# Patient Record
Sex: Female | Born: 1965 | Race: White | Hispanic: No | Smoking: Former smoker
Health system: Southern US, Community
[De-identification: ages and names within clinical notes are randomized; demographics above are authoritative.]

## PROBLEM LIST (undated history)

## (undated) DIAGNOSIS — E119 Type 2 diabetes mellitus without complications: Secondary | ICD-10-CM

## (undated) DIAGNOSIS — I471 Supraventricular tachycardia, unspecified: Secondary | ICD-10-CM

## (undated) DIAGNOSIS — I10 Essential (primary) hypertension: Secondary | ICD-10-CM

## (undated) DIAGNOSIS — M6282 Rhabdomyolysis: Secondary | ICD-10-CM

## (undated) DIAGNOSIS — K219 Gastro-esophageal reflux disease without esophagitis: Secondary | ICD-10-CM

## (undated) DIAGNOSIS — T79A21A Traumatic compartment syndrome of right lower extremity, initial encounter: Secondary | ICD-10-CM

## (undated) DIAGNOSIS — E785 Hyperlipidemia, unspecified: Secondary | ICD-10-CM

## (undated) DIAGNOSIS — R740 Nonspecific elevation of levels of transaminase and lactic acid dehydrogenase [LDH]: Secondary | ICD-10-CM

## (undated) DIAGNOSIS — E079 Disorder of thyroid, unspecified: Secondary | ICD-10-CM

## (undated) HISTORY — DX: Traumatic compartment syndrome of right lower extremity, initial encounter: T79.A21A

## (undated) HISTORY — PX: THYROIDECTOMY: SHX17

## (undated) HISTORY — PX: CHOLECYSTECTOMY: SHX55

## (undated) HISTORY — PX: REDUCTION MAMMAPLASTY: SUR839

## (undated) HISTORY — DX: Disorder of thyroid, unspecified: E07.9

## (undated) HISTORY — DX: Rhabdomyolysis: M62.82

## (undated) HISTORY — PX: RIGHT OOPHORECTOMY: SHX2359

## (undated) HISTORY — PX: ABDOMINAL HYSTERECTOMY: SHX81

## (undated) HISTORY — DX: Hyperlipidemia, unspecified: E78.5

## (undated) HISTORY — DX: Nonspecific elevation of levels of transaminase and lactic acid dehydrogenase (ldh): R74.0

---

## 2012-10-20 ENCOUNTER — Other Ambulatory Visit: Payer: Self-pay

## 2012-10-20 DIAGNOSIS — R102 Pelvic and perineal pain: Secondary | ICD-10-CM

## 2012-10-20 DIAGNOSIS — N939 Abnormal uterine and vaginal bleeding, unspecified: Secondary | ICD-10-CM

## 2012-10-21 ENCOUNTER — Ambulatory Visit
Admission: RE | Admit: 2012-10-21 | Discharge: 2012-10-21 | Disposition: A | Discharge status: Home / Self Care | Payer: PRIVATE HEALTH INSURANCE | Source: Ambulatory Visit

## 2012-10-21 DIAGNOSIS — N939 Abnormal uterine and vaginal bleeding, unspecified: Secondary | ICD-10-CM

## 2012-10-21 DIAGNOSIS — R102 Pelvic and perineal pain: Secondary | ICD-10-CM

## 2012-12-06 HISTORY — PX: RIGHT OOPHORECTOMY: SHX2359

## 2013-04-24 DIAGNOSIS — Z8719 Personal history of other diseases of the digestive system: Secondary | ICD-10-CM | POA: Insufficient documentation

## 2013-04-24 DIAGNOSIS — R109 Unspecified abdominal pain: Secondary | ICD-10-CM | POA: Insufficient documentation

## 2013-11-26 ENCOUNTER — Emergency Department (HOSPITAL_BASED_OUTPATIENT_CLINIC_OR_DEPARTMENT_OTHER): Payer: No Typology Code available for payment source

## 2013-11-26 ENCOUNTER — Encounter (HOSPITAL_BASED_OUTPATIENT_CLINIC_OR_DEPARTMENT_OTHER): Payer: Self-pay | Admitting: Emergency Medicine

## 2013-11-26 ENCOUNTER — Emergency Department (HOSPITAL_BASED_OUTPATIENT_CLINIC_OR_DEPARTMENT_OTHER)
Admission: EM | Admit: 2013-11-26 | Discharge: 2013-11-26 | Disposition: A | Discharge status: Home / Self Care | Payer: No Typology Code available for payment source | Attending: Emergency Medicine | Admitting: Emergency Medicine

## 2013-11-26 DIAGNOSIS — W260XXA Contact with knife, initial encounter: Secondary | ICD-10-CM | POA: Insufficient documentation

## 2013-11-26 DIAGNOSIS — Y939 Activity, unspecified: Secondary | ICD-10-CM | POA: Insufficient documentation

## 2013-11-26 DIAGNOSIS — K219 Gastro-esophageal reflux disease without esophagitis: Secondary | ICD-10-CM | POA: Insufficient documentation

## 2013-11-26 DIAGNOSIS — S51809A Unspecified open wound of unspecified forearm, initial encounter: Secondary | ICD-10-CM | POA: Insufficient documentation

## 2013-11-26 DIAGNOSIS — Z791 Long term (current) use of non-steroidal anti-inflammatories (NSAID): Secondary | ICD-10-CM | POA: Insufficient documentation

## 2013-11-26 DIAGNOSIS — Y92009 Unspecified place in unspecified non-institutional (private) residence as the place of occurrence of the external cause: Secondary | ICD-10-CM | POA: Insufficient documentation

## 2013-11-26 DIAGNOSIS — W261XXA Contact with sword or dagger, initial encounter: Secondary | ICD-10-CM

## 2013-11-26 DIAGNOSIS — F17200 Nicotine dependence, unspecified, uncomplicated: Secondary | ICD-10-CM | POA: Insufficient documentation

## 2013-11-26 DIAGNOSIS — I10 Essential (primary) hypertension: Secondary | ICD-10-CM | POA: Insufficient documentation

## 2013-11-26 DIAGNOSIS — S41119A Laceration without foreign body of unspecified upper arm, initial encounter: Secondary | ICD-10-CM

## 2013-11-26 DIAGNOSIS — Z79899 Other long term (current) drug therapy: Secondary | ICD-10-CM | POA: Insufficient documentation

## 2013-11-26 HISTORY — DX: Supraventricular tachycardia, unspecified: I47.10

## 2013-11-26 HISTORY — DX: Essential (primary) hypertension: I10

## 2013-11-26 HISTORY — DX: Gastro-esophageal reflux disease without esophagitis: K21.9

## 2013-11-26 HISTORY — DX: Supraventricular tachycardia: I47.1

## 2013-11-26 NOTE — ED Notes (Signed)
MD at bedside. 

## 2013-11-26 NOTE — ED Notes (Signed)
Quick clot gauze applied to arm per MD order and wrapped with kerlix. Pt instructed to elevate arm, pt agrees with plan of care.

## 2013-11-26 NOTE — ED Notes (Signed)
Patient transported to X-ray 

## 2013-11-26 NOTE — ED Provider Notes (Addendum)
CSN: [id number]     Arrival date & time 11/26/13  0257 History   First MD Initiated Contact with Patient 11/26/13 0309     Chief Complaint  Patient presents with  . Extremity Laceration   (Consider location/radiation/quality/duration/timing/severity/associated sxs/prior Treatment) Patient is a 48 y.o. female presenting with skin laceration. The history is provided by the patient. No language interpreter was used.  Laceration Location:  Shoulder/arm Shoulder/arm laceration location:  L forearm Length (cm):  10 Depth:  Through dermis Quality: straight   Bleeding: controlled   Laceration mechanism:  Metal edge Pain details:    Quality:  Aching   Severity:  Moderate   Timing:  Constant   Progression:  Unchanged Foreign body present:  No foreign bodies Relieved by:  Nothing Worsened by:  Nothing tried Tetanus status:  Up to date   Past Medical History  Diagnosis Date  . SVT (supraventricular tachycardia)   . Hypertension   . GERD (gastroesophageal reflux disease)    Past Surgical History  Procedure Laterality Date  . Cholecystectomy    . Abdominal hysterectomy    . Thyroidectomy     History reviewed. No pertinent family history. History  Substance Use Topics  . Smoking status: Current Every Day Smoker  . Smokeless tobacco: Not on file  . Alcohol Use: Yes   OB History   Grav Para Term Preterm Abortions TAB SAB Ect Mult Living                 Review of Systems  All other systems reviewed and are negative.    Allergies  Ciprofloxacin; Dilaudid; and Prednisone  Home Medications   Current Outpatient Rx  Name  Route  Sig  Dispense  Refill  . ALPRAZolam (XANAX) 0.5 MG tablet   Oral   Take 0.5 mg by mouth at bedtime as needed for anxiety.         Marland Kitchen. amphetamine-dextroamphetamine (ADDERALL XR) 30 MG 24 hr capsule   Oral   Take 30 mg by mouth daily.         . cetirizine (ZYRTEC) 10 MG tablet   Oral   Take 10 mg by mouth daily.         Marland Kitchen. diltiazem  (CARDIZEM CD) 180 MG 24 hr capsule   Oral   Take 180 mg by mouth daily.         Marland Kitchen. esomeprazole (NEXIUM) 40 MG capsule   Oral   Take 40 mg by mouth daily at 12 noon.         Marland Kitchen. levothyroxine (SYNTHROID, LEVOTHROID) 150 MCG tablet   Oral   Take 150 mcg by mouth daily before breakfast.         . meloxicam (MOBIC) 7.5 MG tablet   Oral   Take 7.5 mg by mouth daily.         Marland Kitchen. oxycodone (OXY-IR) 5 MG capsule   Oral   Take 5 mg by mouth every 4 (four) hours as needed.         Marland Kitchen. spironolactone (ALDACTONE) 25 MG tablet   Oral   Take 25 mg by mouth daily.          BP 139/85  Pulse 116  Resp 20  Ht 5' (1.524 m)  Wt 130 lb (58.968 kg)  BMI 25.39 kg/m2  SpO2 98% Physical Exam  Constitutional: She appears well-developed and well-nourished.  intoxicated  HENT:  Head: Normocephalic and atraumatic. Head is without raccoon's eyes and without Battle's sign.  Eyes: EOM are normal. Pupils are equal, round, and reactive to light.  Neck: Normal range of motion. Neck supple.  Cardiovascular: Normal rate, regular rhythm and intact distal pulses.   Pulmonary/Chest: Effort normal and breath sounds normal. She has no wheezes. She has no rales.  Abdominal: Soft. Bowel sounds are normal. There is no tenderness. There is no rebound and no guarding.  Musculoskeletal: Normal range of motion.       Left forearm: She exhibits laceration.       Arms: Cap refill < 2 sec to the digits of the elft hand.  Wound explored no tendon no nerve no vascular injury.  FROM of the left hand and left wrist. Intact interosseous strength Thumb apposition tests and intact.   Left hand neurovascularly intact sensation intact to all nerve distributions of the left hand and forearm.  Witnessed by EMT Lequita Halt  Neurological: She is alert. She has normal reflexes.  Skin: Skin is warm and dry.  Psychiatric: Thought content normal.    ED Course  Procedures (including critical care time) Labs Review Labs Reviewed -  No data to display Imaging Review No results found.  EKG Interpretation   None       MDM  No diagnosis found. LACERATION REPAIR Performed by: Jasmine Awe Authorized by: Jasmine Awe Consent: Verbal consent obtained. Risks and benefits: risks, benefits and alternatives were discussed Consent given by: patient Patient identity confirmed: provided demographic data Prepped and Draped in normal sterile fashion Wound explored  Laceration Location: left volar forearm  Explored thoroughly no tendon nor nerve no vascular injury    Laceration Length: 10 cm  No Foreign Bodies seen or palpated  Anesthesia: local infiltration  Local anesthetic: lidocaine 1 %  With epinephrine  Anesthetic total: 7 ml  Irrigation method: syringe Amount of cleaning: extensive   Skin closure: 3.0 ethilon   Number of sutures: 14  Technique: interrupted  Patient tolerance: Patient tolerated the procedure well with no immediate complications.   Suture removal in 12 days    Swara Donze K Kijuana Ruppel-Rasch, MD 11/26/13 0457  Torian Thoennes K Marykathryn Carboni-Rasch, MD 11/26/13 [phone]

## 2013-11-26 NOTE — ED Notes (Signed)
MD at bedside for laceration repair.

## 2013-11-26 NOTE — ED Notes (Signed)
Pt has laceration to left forearm from knife at home, +PMS to extremity. Pt admits to ETOH. Tetanus UTD.

## 2013-11-26 NOTE — Discharge Instructions (Signed)

## 2013-12-10 ENCOUNTER — Ambulatory Visit (INDEPENDENT_AMBULATORY_CARE_PROVIDER_SITE_OTHER): Payer: No Typology Code available for payment source | Admitting: Internal Medicine

## 2013-12-10 ENCOUNTER — Encounter: Payer: Self-pay | Admitting: Internal Medicine

## 2013-12-10 ENCOUNTER — Ambulatory Visit: Payer: No Typology Code available for payment source | Admitting: Internal Medicine

## 2013-12-10 VITALS — BP 120/80 | HR 80 | Temp 98.5°F | Resp 16 | Ht 59.0 in | Wt 130.0 lb

## 2013-12-10 DIAGNOSIS — Z5189 Encounter for other specified aftercare: Secondary | ICD-10-CM

## 2013-12-10 NOTE — Progress Notes (Signed)
Pre visit review using our clinic review tool, if applicable. No additional management support is needed unless otherwise documented below in the visit note. 

## 2013-12-11 ENCOUNTER — Encounter: Payer: Self-pay | Admitting: Internal Medicine

## 2013-12-11 ENCOUNTER — Ambulatory Visit: Payer: No Typology Code available for payment source | Admitting: Internal Medicine

## 2013-12-11 DIAGNOSIS — Z5189 Encounter for other specified aftercare: Secondary | ICD-10-CM | POA: Insufficient documentation

## 2013-12-11 NOTE — Assessment & Plan Note (Signed)
The wound has healed well with no complications

## 2013-12-11 NOTE — Patient Instructions (Signed)
Wound Care Wound care helps prevent pain and infection.  You may need a tetanus shot if:  You cannot remember when you had your last tetanus shot.  You have never had a tetanus shot.  The injury broke your skin. If you need a tetanus shot and you choose not to have one, you may get tetanus. Sickness from tetanus can be serious. HOME CARE   Only take medicine as told by your doctor.  Clean the wound daily with mild soap and water.  Change any bandages (dressings) as told by your doctor.  Put medicated cream and a bandage on the wound as told by your doctor.  Change the bandage if it gets wet, dirty, or starts to smell.  Take showers. Do not take baths, swim, or do anything that puts your wound under water.  Rest and raise (elevate) the wound until the pain and puffiness (swelling) are better.  Keep all doctor visits as told. GET HELP RIGHT AWAY IF:   Yellowish-white fluid (pus) comes from the wound.  Medicine does not lessen your pain.  There is a red streak going away from the wound.  You have a fever. MAKE SURE YOU:   Understand these instructions.  Will watch your condition.  Will get help right away if you are not doing well or get worse. Document Released: 07/31/2008 Document Revised: 01/14/2012 Document Reviewed: 02/25/2011 [employer] Patient Information 2014 [employer].  

## 2013-12-11 NOTE — Progress Notes (Signed)
   Subjective:    Patient ID: Jessica Knox, female    DOB: [birth year], 48 y.o.   MRN: [id number]  Wound Check She was originally treated more than 14 days ago. Previous treatment included laceration repair. The maximum temperature noted was less than 100.4 F. There has been no drainage from the wound. There is no redness present. There is no swelling present. The pain has no pain. She has no difficulty moving the affected extremity or digit.      Review of Systems  All other systems reviewed and are negative.       Objective:   Physical Exam  Musculoskeletal:       Arms: 14 sutures were removed without complication. Steri-strips were placed over the wound at her request for support.          Assessment & Plan:

## 2014-09-28 ENCOUNTER — Encounter: Payer: Self-pay | Admitting: Radiology

## 2014-09-28 NOTE — Addendum Note (Signed)
Addended byCaffie Damme: LITTRELL, AMY W on: 09/28/2014 11:41 AM   Modules accepted: Orders, Medications

## 2014-10-27 ENCOUNTER — Ambulatory Visit: Payer: Self-pay | Admitting: Family Medicine

## 2014-11-02 ENCOUNTER — Encounter: Payer: Self-pay | Admitting: Family Medicine

## 2014-11-02 ENCOUNTER — Ambulatory Visit (INDEPENDENT_AMBULATORY_CARE_PROVIDER_SITE_OTHER): Payer: BC Managed Care – PPO | Admitting: Family Medicine

## 2014-11-02 VITALS — BP 128/83 | HR 100 | Temp 98.2°F | Resp 16 | Ht 59.25 in | Wt 133.4 lb

## 2014-11-02 DIAGNOSIS — F988 Other specified behavioral and emotional disorders with onset usually occurring in childhood and adolescence: Secondary | ICD-10-CM

## 2014-11-02 DIAGNOSIS — I10 Essential (primary) hypertension: Secondary | ICD-10-CM

## 2014-11-02 DIAGNOSIS — E039 Hypothyroidism, unspecified: Secondary | ICD-10-CM

## 2014-11-02 DIAGNOSIS — F909 Attention-deficit hyperactivity disorder, unspecified type: Secondary | ICD-10-CM

## 2014-11-02 DIAGNOSIS — N390 Urinary tract infection, site not specified: Secondary | ICD-10-CM

## 2014-11-02 LAB — LIPID PANEL
CHOL/HDL RATIO: 3.9 ratio
Cholesterol: 183 mg/dL (ref 0–200)
HDL: 47 mg/dL (ref >39)
LDL Cholesterol: 88 mg/dL (ref 0–99)
TRIGLYCERIDES: 242 mg/dL — AB (ref <150)
VLDL: 48 mg/dL — ABNORMAL HIGH (ref 0–40)

## 2014-11-02 LAB — COMPREHENSIVE METABOLIC PANEL
ALBUMIN: 4.1 g/dL (ref 3.5–5.2)
ALK PHOS: 83 U/L (ref 39–117)
ALT: 23 U/L (ref 0–35)
AST: 16 U/L (ref 0–37)
BUN: 11 mg/dL (ref 6–23)
CO2: 24 mEq/L (ref 19–32)
Calcium: 8.7 mg/dL (ref 8.4–10.5)
Chloride: 101 mEq/L (ref 96–112)
Creat: 0.7 mg/dL (ref 0.50–1.10)
GLUCOSE: 153 mg/dL — AB (ref 70–99)
Potassium: 4.3 mEq/L (ref 3.5–5.3)
Sodium: 135 mEq/L (ref 135–145)
Total Bilirubin: 0.3 mg/dL (ref 0.2–1.2)
Total Protein: 6.3 g/dL (ref 6.0–8.3)

## 2014-11-02 LAB — POCT URINALYSIS DIPSTICK
Bilirubin, UA: NEGATIVE
Blood, UA: NEGATIVE
Glucose, UA: NEGATIVE
Ketones, UA: NEGATIVE
PROTEIN UA: NEGATIVE
Spec Grav, UA: 1.015
UROBILINOGEN UA: 0.2
pH, UA: 7

## 2014-11-02 LAB — CBC
HCT: 45 % (ref 36.0–46.0)
Hemoglobin: 15.5 g/dL — ABNORMAL HIGH (ref 12.0–15.0)
MCH: 32.3 pg (ref 26.0–34.0)
MCHC: 34.4 g/dL (ref 30.0–36.0)
MCV: 93.8 fL (ref 78.0–100.0)
MPV: 9.6 fL (ref 8.6–12.4)
PLATELETS: 328 10*3/uL (ref 150–400)
RBC: 4.8 MIL/uL (ref 3.87–5.11)
RDW: 12.4 % (ref 11.5–15.5)
WBC: 9.4 10*3/uL (ref 4.0–10.5)

## 2014-11-02 MED ORDER — AMPHETAMINE-DEXTROAMPHET ER 20 MG PO CP24: 20.0000 mg | ORAL_CAPSULE | Freq: Two times a day (BID) | ORAL | Status: DC

## 2014-11-02 NOTE — Progress Notes (Signed)
Subjective:    Patient ID: Jessica Knox, female    DOB: 23-Oct-1966, 48 y.o.   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 She has a history of hypothyroidism, last TSH 08/09/14. Has had fluctuating TSH levels and is currently taking synthroid 150 mcg.  Long standing history of right sided lower abdominal pain since 6/14. Sees gyn and may have DaVinci procedure for exploration. Was seen about a month ago. Pain worse with stress and intercourse. Feels like contractions and then "butcher knife." occasionally takes oxycodone immediate release. Has follow up scheduled with gyn.  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 Has history of reentry SVT. Has history of frequent UTI. Has never been on prophylactic or post-coital antibiotics. Gets about 6 a year. Keeps mild symptoms.  Has had recent stressors with older son having a mental breakdown and fiance with cardiac issues.   Past Medical History  Diagnosis Date  . SVT (supraventricular tachycardia)   . Hypertension   . GERD (gastroesophageal reflux disease)   . Thyroid disease     multinodular thyroid total thyroidectomy   Past Surgical History  Procedure Laterality Date  . Cholecystectomy    . Abdominal hysterectomy    . Thyroidectomy    . Right oophorectomy  12/06/2012  multiple D&C, Csection x 2, breast reduction. Family History  Problem Relation Age of Onset  . Adopted: Yes  . Family history unknown: Yes   History  Substance Use Topics  . Smoking status: Current Every Day Smoker  . Smokeless tobacco: Not on file  . Alcohol Use: Yes   Review of Systems  Constitutional: Negative for fever and chills.  Respiratory: Negative for cough, chest tightness, shortness of breath and wheezing.   Cardiovascular: Negative for chest pain, palpitations and leg swelling.  Gastrointestinal: Positive for abdominal pain. Negative for nausea,  vomiting, diarrhea and constipation.  Genitourinary: Negative for vaginal discharge.  Musculoskeletal: Negative.   Allergic/Immunologic: Negative.   Neurological: Negative.   Hematological: Negative.       Objective:   Physical Exam  Constitutional: She is oriented to person, place, and time. She appears well-developed and well-nourished. No distress.  HENT:  Head: Normocephalic and atraumatic.  Right Ear: Tympanic membrane, external ear and ear canal normal.  Left Ear: Tympanic membrane, external ear and ear canal normal.  Nose: Nose normal.  Mouth/Throat: Oropharynx is clear and moist.  Eyes: Conjunctivae are normal. Pupils are equal, round, and reactive to light.  Neck: Normal range of motion. Neck supple. No thyromegaly present.  Cardiovascular: Normal rate and regular rhythm.   Murmur heard.  Systolic murmur is present with a grade of 2/6  Pulmonary/Chest: Effort normal and breath sounds normal.  Abdominal: Soft. Bowel sounds are normal. She exhibits no distension and no mass. There is no tenderness. There is no rebound and no guarding.  Musculoskeletal: Normal range of motion.  Lymphadenopathy:    She has no cervical adenopathy.  Neurological: She is alert and oriented to person, place, and time.  Skin: Skin is warm and dry. She is not diaphoretic.  Psychiatric: She has a normal mood and affect. Her behavior is normal. Judgment and thought content normal.  Vitals reviewed.  BP 128/83 mmHg  Pulse 100  Temp(Src) 98.2 F (36.8 C) (Oral)  Resp 16  Ht 4' 11.25" (1.505 m)  Wt 133 lb 6.4 oz (60.51 kg)  BMI  26.71 kg/m2  SpO2 98%    Assessment & Plan:  1. ADD (attention deficit disorder) - amphetamine-dextroamphetamine (ADDERALL XR) 20 MG 24 hr capsule; Take 1 capsule (20 mg total) by mouth 2 (two) times daily.  Dispense: 60 capsule; Refill: 0  2. Essential hypertension - CBC - Comprehensive metabolic panel - Lipid panel  3. Hypothyroidism, unspecified hypothyroidism  type - CBC - Comprehensive metabolic panel - TSH - Lipid panel  4. Frequent UTI - POCT urinalysis dipstick   Emi Belfasteborah B. Gessner, FNP-BC  [facility], [facility]  11/04/2014 7:14 AM

## 2014-11-03 ENCOUNTER — Other Ambulatory Visit: Payer: Self-pay | Admitting: Family Medicine

## 2014-11-03 ENCOUNTER — Other Ambulatory Visit (INDEPENDENT_AMBULATORY_CARE_PROVIDER_SITE_OTHER): Payer: BC Managed Care – PPO

## 2014-11-03 DIAGNOSIS — E890 Postprocedural hypothyroidism: Secondary | ICD-10-CM

## 2014-11-03 DIAGNOSIS — R8290 Unspecified abnormal findings in urine: Secondary | ICD-10-CM

## 2014-11-03 LAB — TSH: TSH: 0.069 u[IU]/mL — ABNORMAL LOW (ref 0.350–4.500)

## 2014-11-03 MED ORDER — LEVOTHYROXINE SODIUM 125 MCG PO TABS: 125.0000 ug | ORAL_TABLET | Freq: Every day | ORAL | Status: DC

## 2014-11-05 ENCOUNTER — Other Ambulatory Visit: Payer: Self-pay | Admitting: Family Medicine

## 2014-11-05 LAB — URINE CULTURE: Colony Count: >=100000

## 2014-11-05 MED ORDER — NITROFURANTOIN MONOHYD MACRO 100 MG PO CAPS: 100.0000 mg | ORAL_CAPSULE | Freq: Two times a day (BID) | ORAL | Status: DC

## 2014-11-16 ENCOUNTER — Other Ambulatory Visit: Payer: Self-pay | Admitting: Family Medicine

## 2014-11-16 ENCOUNTER — Other Ambulatory Visit (INDEPENDENT_AMBULATORY_CARE_PROVIDER_SITE_OTHER): Payer: BLUE CROSS/BLUE SHIELD | Admitting: Family Medicine

## 2014-11-16 ENCOUNTER — Telehealth: Payer: Self-pay | Admitting: Family Medicine

## 2014-11-16 DIAGNOSIS — N390 Urinary tract infection, site not specified: Secondary | ICD-10-CM

## 2014-11-16 DIAGNOSIS — N3090 Cystitis, unspecified without hematuria: Secondary | ICD-10-CM

## 2014-11-16 NOTE — Telephone Encounter (Signed)
Patient reports urinary symptoms not resolved after finishing macrobid. Will reculture urine. Order placed.

## 2014-11-17 ENCOUNTER — Telehealth: Payer: Self-pay | Admitting: Family Medicine

## 2014-11-17 ENCOUNTER — Other Ambulatory Visit: Payer: Self-pay | Admitting: Family Medicine

## 2014-11-17 MED ORDER — SERTRALINE HCL 100 MG PO TABS: ORAL_TABLET | ORAL | Status: DC

## 2014-11-17 NOTE — Telephone Encounter (Signed)
Patient interested in starting SSRI for depressive symptoms. Her therapist, Dr. Danne BaxterAaron Stuart has suggested sertraline. Discussed with patient . Order for sertraline sent to her pharmacy.

## 2014-11-18 ENCOUNTER — Encounter: Payer: Self-pay | Admitting: Family Medicine

## 2014-11-18 LAB — URINE CULTURE: Colony Count: >=100000

## 2014-11-20 ENCOUNTER — Encounter: Payer: Self-pay | Admitting: Family Medicine

## 2014-11-20 ENCOUNTER — Other Ambulatory Visit: Payer: Self-pay | Admitting: Family Medicine

## 2014-11-20 DIAGNOSIS — B9620 Unspecified Escherichia coli [E. coli] as the cause of diseases classified elsewhere: Secondary | ICD-10-CM

## 2014-11-20 DIAGNOSIS — N390 Urinary tract infection, site not specified: Secondary | ICD-10-CM

## 2014-11-20 MED ORDER — LEVOFLOXACIN 250 MG PO TABS: 250.0000 mg | ORAL_TABLET | Freq: Every day | ORAL | Status: DC

## 2014-11-30 ENCOUNTER — Other Ambulatory Visit: Payer: Self-pay | Admitting: Family Medicine

## 2014-11-30 DIAGNOSIS — K219 Gastro-esophageal reflux disease without esophagitis: Secondary | ICD-10-CM

## 2014-11-30 MED ORDER — ESOMEPRAZOLE MAGNESIUM 40 MG PO CPDR: 40.0000 mg | DELAYED_RELEASE_CAPSULE | Freq: Every day | ORAL | Status: DC

## 2014-12-04 ENCOUNTER — Other Ambulatory Visit: Payer: Self-pay | Admitting: Family Medicine

## 2014-12-04 ENCOUNTER — Other Ambulatory Visit: Payer: Self-pay

## 2014-12-04 MED ORDER — SPIRONOLACTONE 25 MG PO TABS: 25.0000 mg | ORAL_TABLET | Freq: Every day | ORAL | Status: DC

## 2014-12-04 MED ORDER — DILTIAZEM HCL ER COATED BEADS 180 MG PO CP24: 180.0000 mg | ORAL_CAPSULE | Freq: Every day | ORAL | Status: DC

## 2014-12-17 DIAGNOSIS — R102 Pelvic and perineal pain: Secondary | ICD-10-CM | POA: Insufficient documentation

## 2014-12-21 ENCOUNTER — Other Ambulatory Visit: Payer: Self-pay | Admitting: Family Medicine

## 2014-12-21 DIAGNOSIS — Z01818 Encounter for other preprocedural examination: Secondary | ICD-10-CM

## 2014-12-28 ENCOUNTER — Other Ambulatory Visit (INDEPENDENT_AMBULATORY_CARE_PROVIDER_SITE_OTHER): Payer: BLUE CROSS/BLUE SHIELD | Admitting: Family Medicine

## 2014-12-28 DIAGNOSIS — B9620 Unspecified Escherichia coli [E. coli] as the cause of diseases classified elsewhere: Secondary | ICD-10-CM

## 2014-12-28 DIAGNOSIS — N390 Urinary tract infection, site not specified: Secondary | ICD-10-CM

## 2014-12-28 DIAGNOSIS — E890 Postprocedural hypothyroidism: Secondary | ICD-10-CM

## 2014-12-28 DIAGNOSIS — Z01818 Encounter for other preprocedural examination: Secondary | ICD-10-CM

## 2014-12-28 LAB — POCT URINALYSIS DIPSTICK
Bilirubin, UA: NEGATIVE
Glucose, UA: NEGATIVE
KETONES UA: NEGATIVE
Nitrite, UA: POSITIVE
PROTEIN UA: NEGATIVE
Spec Grav, UA: >=1.03
Urobilinogen, UA: 0.2
pH, UA: 5.5

## 2014-12-28 LAB — CBC WITH DIFFERENTIAL/PLATELET
BASOS ABS: 0 10*3/uL (ref 0.0–0.1)
BASOS PCT: 0 % (ref 0–1)
EOS ABS: 0.2 10*3/uL (ref 0.0–0.7)
EOS PCT: 2 % (ref 0–5)
HEMATOCRIT: 46 % (ref 36.0–46.0)
Hemoglobin: 15.8 g/dL — ABNORMAL HIGH (ref 12.0–15.0)
Lymphocytes Relative: 23 % (ref 12–46)
Lymphs Abs: 2.5 10*3/uL (ref 0.7–4.0)
MCH: 32 pg (ref 26.0–34.0)
MCHC: 34.3 g/dL (ref 30.0–36.0)
MCV: 93.1 fL (ref 78.0–100.0)
MONO ABS: 1.1 10*3/uL — AB (ref 0.1–1.0)
MONOS PCT: 10 % (ref 3–12)
MPV: 9.2 fL (ref 8.6–12.4)
Neutro Abs: 7.2 10*3/uL (ref 1.7–7.7)
Neutrophils Relative %: 65 % (ref 43–77)
Platelets: 272 10*3/uL (ref 150–400)
RBC: 4.94 MIL/uL (ref 3.87–5.11)
RDW: 13 % (ref 11.5–15.5)
WBC: 11 10*3/uL — AB (ref 4.0–10.5)

## 2014-12-28 LAB — BASIC METABOLIC PANEL
BUN: 12 mg/dL (ref 6–23)
CALCIUM: 8.9 mg/dL (ref 8.4–10.5)
CO2: 25 mEq/L (ref 19–32)
CREATININE: 0.67 mg/dL (ref 0.50–1.10)
Chloride: 102 mEq/L (ref 96–112)
GLUCOSE: 106 mg/dL — AB (ref 70–99)
POTASSIUM: 4.1 meq/L (ref 3.5–5.3)
Sodium: 136 mEq/L (ref 135–145)

## 2014-12-28 LAB — POCT UA - MICROSCOPIC ONLY
CASTS, UR, LPF, POC: NEGATIVE
CRYSTALS, UR, HPF, POC: NEGATIVE
Mucus, UA: POSITIVE
Yeast, UA: NEGATIVE

## 2014-12-28 LAB — TSH: TSH: 0.934 u[IU]/mL (ref 0.350–4.500)

## 2014-12-31 ENCOUNTER — Encounter: Payer: Self-pay | Admitting: Family Medicine

## 2014-12-31 LAB — URINE CULTURE

## 2015-01-03 ENCOUNTER — Other Ambulatory Visit: Payer: Self-pay | Admitting: Family Medicine

## 2015-01-03 MED ORDER — FLUCONAZOLE 150 MG PO TABS: 150.0000 mg | ORAL_TABLET | Freq: Once | ORAL | Status: DC

## 2015-01-10 HISTORY — PX: ROBOTIC ASSISTED DIAGNOSTIC LAPAROSCOPY: SHX6532

## 2015-01-11 ENCOUNTER — Ambulatory Visit (INDEPENDENT_AMBULATORY_CARE_PROVIDER_SITE_OTHER): Payer: BLUE CROSS/BLUE SHIELD | Admitting: Family Medicine

## 2015-01-11 ENCOUNTER — Other Ambulatory Visit: Payer: Self-pay | Admitting: Family Medicine

## 2015-01-11 VITALS — BP 130/78 | HR 115 | Temp 97.8°F | Resp 20 | Ht 59.25 in | Wt 149.2 lb

## 2015-01-11 DIAGNOSIS — G8918 Other acute postprocedural pain: Secondary | ICD-10-CM

## 2015-01-11 DIAGNOSIS — R110 Nausea: Secondary | ICD-10-CM

## 2015-01-11 DIAGNOSIS — R1030 Lower abdominal pain, unspecified: Secondary | ICD-10-CM

## 2015-01-11 MED ORDER — KETOROLAC TROMETHAMINE 60 MG/2ML IM SOLN
60.0000 mg | Freq: Once | INTRAMUSCULAR | Status: AC
  Administered 2015-01-11: 60 mg via INTRAMUSCULAR

## 2015-01-11 MED ORDER — OXYCODONE HCL 5 MG PO CAPS: 5.0000 mg | ORAL_CAPSULE | ORAL | Status: DC | PRN

## 2015-01-11 MED ORDER — ONDANSETRON 8 MG PO TBDP: 8.0000 mg | ORAL_TABLET | Freq: Three times a day (TID) | ORAL | Status: DC | PRN

## 2015-01-11 NOTE — Progress Notes (Signed)
   Subjective:    Patient ID: Jessica Knox, female    DOB: 1966/02/09, 49 y.o.   MRN: [id number]  HPI Patient presents today following exploratory lap yesterday. This was done out of town. She was discharged yesterday evening and post operatively was not given her prescriptions for pain or nausea. Her fiance brings her in this evening after they have been driving for several hours. She is currently having 10+/10 abdominal pain that is generalized. She is not having any fever, nausea, vomiting, no chest pain, she has had some SOB with exertion. Urinating without difficulty. Passing gas and moving bowels. Last BM today.   Review of Systems No fever/chills, no chest pain. No nausea, no vomiting. Feels winded with exertion. Good fluid intake.     Objective:   Physical Exam  Constitutional: She is oriented to person, place, and time. She appears well-developed and well-nourished.  Looks uncomfortable.   HENT:  Head: Normocephalic and atraumatic.  Eyes: Conjunctivae are normal.  Neck: Normal range of motion.  Cardiovascular: Regular rhythm and normal heart sounds.  Tachycardia present.   Pulmonary/Chest: Effort normal and breath sounds normal.  Abdominal: Bowel sounds are normal. She exhibits distension. She exhibits no mass. There is generalized tenderness. There is no rigidity, no rebound and no guarding.    Three surgical sites. No erythema, no drainage, no warmth.   Musculoskeletal: Normal range of motion.  Neurological: She is alert and oriented to person, place, and time.  Skin: Skin is warm and dry.  Psychiatric: She has a normal mood and affect. Her behavior is normal. Judgment and thought content normal.  Vitals reviewed.  BP 130/78 mmHg  Pulse 115  Temp(Src) 97.8 F (36.6 C) (Oral)  Resp 20  Ht 4' 11.25" (1.505 m)  Wt 149 lb 4 oz (67.699 kg)  BMI 29.89 kg/m2  SpO2 96%  Meds ordered this encounter  Medications  . ketorolac (TORADOL) injection 60 mg    Sig:    Patient  with improved pain and decreased HR and resp. Rate following toradol. Looks less uncomfortable.     Assessment & Plan:  1. Post-op pain - ketorolac (TORADOL) injection 60 mg; Inject 2 mLs (60 mg total) into the muscle once. - encouraged regular pain medication use, avoidance of constipation - follow up if worsening symptoms, fever, vomiting. Emi Belfasteborah B. Markasia Carrol, FNP-BC  [facility]  01/13/2015 10:22 PM

## 2015-01-11 NOTE — Patient Instructions (Signed)
Increase fluids Try to add an NSAID every 8 hours

## 2015-01-11 NOTE — Progress Notes (Unsigned)
[place] yesterday [place], [place] to [place] and realized she did not have her pain med or anti nausea prescriptions. Will give her enough for the next couple of days.

## 2015-01-14 NOTE — Progress Notes (Signed)
It would be good to have her go ahead and notify her surgeon of all that was done here so they are also aware. She could call herself.

## 2015-01-24 ENCOUNTER — Other Ambulatory Visit: Payer: Self-pay | Admitting: Family Medicine

## 2015-01-24 MED ORDER — SERTRALINE HCL 100 MG PO TABS: 150.0000 mg | ORAL_TABLET | Freq: Every day | ORAL | Status: DC

## 2015-01-24 NOTE — Progress Notes (Signed)
Patient post op from exploratory lap and oopherectomy 2 weeks prior. Has had increased emotional lability and crying spells. Denies suicidal ideation. Was instructed to increase sertraline dose to 150 mg. New prescription sent in.

## 2015-01-25 ENCOUNTER — Other Ambulatory Visit: Payer: Self-pay | Admitting: Family Medicine

## 2015-01-25 ENCOUNTER — Telehealth: Payer: Self-pay | Admitting: Family Medicine

## 2015-01-25 DIAGNOSIS — R1030 Lower abdominal pain, unspecified: Secondary | ICD-10-CM

## 2015-01-25 MED ORDER — OXYCODONE HCL 5 MG PO CAPS: 5.0000 mg | ORAL_CAPSULE | ORAL | Status: DC | PRN

## 2015-01-25 MED ORDER — MELOXICAM 15 MG PO TABS: 15.0000 mg | ORAL_TABLET | Freq: Every day | ORAL | Status: DC

## 2015-01-25 NOTE — Telephone Encounter (Signed)
Spoke with patient in person at work today. She continues to have pain following exploratory lap 2 weeks ago. Requesting refill of Oxy- IR 5 mg. She reports she is taking 1-2 tablets every 4-8 hours for pain. Is having generalized abdominal pain and feels "pressing from front to back." Denies constipation. Consuming liquids and solids without difficulty. Denies fever. Has felt bloated since surgery. Discussed filling pain medication with patient and need to refer to pain clinic for management of chronic pain medication. Patient states that she is not planning on being on pain medication long term. Discussed the fact that she has been on pain meds for 2 years and asked her to think about need for referral which she said she would do. Encouraged patient to be seen at 102 if no improvement in symptoms or if fever or increased pain. Patient reports that she has a call in to gyn who did [place] in [place]. Encouraged her to add NSAID to pain treatment regimen and prescribed meloxicam 15 mg po qd for temporary use.

## 2015-01-26 ENCOUNTER — Encounter: Payer: Self-pay | Admitting: Physician Assistant

## 2015-01-26 ENCOUNTER — Ambulatory Visit (HOSPITAL_COMMUNITY)
Admission: RE | Admit: 2015-01-26 | Discharge: 2015-01-26 | Disposition: A | Discharge status: Home / Self Care | Payer: BLUE CROSS/BLUE SHIELD | Source: Ambulatory Visit | Attending: Physician Assistant | Admitting: Physician Assistant

## 2015-01-26 ENCOUNTER — Ambulatory Visit (INDEPENDENT_AMBULATORY_CARE_PROVIDER_SITE_OTHER): Payer: BLUE CROSS/BLUE SHIELD

## 2015-01-26 ENCOUNTER — Telehealth: Payer: Self-pay | Admitting: Physician Assistant

## 2015-01-26 ENCOUNTER — Ambulatory Visit (INDEPENDENT_AMBULATORY_CARE_PROVIDER_SITE_OTHER): Payer: BLUE CROSS/BLUE SHIELD | Admitting: Physician Assistant

## 2015-01-26 VITALS — BP 134/82 | HR 118 | Temp 98.0°F | Resp 17 | Ht 60.5 in | Wt 148.0 lb

## 2015-01-26 DIAGNOSIS — K760 Fatty (change of) liver, not elsewhere classified: Secondary | ICD-10-CM | POA: Insufficient documentation

## 2015-01-26 DIAGNOSIS — Z6832 Body mass index (BMI) 32.0-32.9, adult: Secondary | ICD-10-CM | POA: Insufficient documentation

## 2015-01-26 DIAGNOSIS — R8271 Bacteriuria: Secondary | ICD-10-CM

## 2015-01-26 DIAGNOSIS — R1084 Generalized abdominal pain: Secondary | ICD-10-CM

## 2015-01-26 DIAGNOSIS — K529 Noninfective gastroenteritis and colitis, unspecified: Secondary | ICD-10-CM | POA: Diagnosis not present

## 2015-01-26 DIAGNOSIS — N390 Urinary tract infection, site not specified: Secondary | ICD-10-CM

## 2015-01-26 DIAGNOSIS — N393 Stress incontinence (female) (male): Secondary | ICD-10-CM | POA: Insufficient documentation

## 2015-01-26 DIAGNOSIS — Z87891 Personal history of nicotine dependence: Secondary | ICD-10-CM | POA: Insufficient documentation

## 2015-01-26 DIAGNOSIS — R1031 Right lower quadrant pain: Secondary | ICD-10-CM | POA: Insufficient documentation

## 2015-01-26 LAB — POCT URINALYSIS DIPSTICK
Bilirubin, UA: NEGATIVE
Blood, UA: NEGATIVE
Glucose, UA: NEGATIVE
Ketones, UA: NEGATIVE
Nitrite, UA: POSITIVE
Protein, UA: NEGATIVE
Spec Grav, UA: 1.015
Urobilinogen, UA: 0.2
pH, UA: 7

## 2015-01-26 LAB — POCT CBC
Granulocyte percent: 72.2 %G (ref 37–80)
HCT, POC: 49.7 % — AB (ref 37.7–47.9)
Hemoglobin: 16.4 g/dL — AB (ref 12.2–16.2)
Lymph, poc: 2.3 (ref 0.6–3.4)
MCH, POC: 31.4 pg — AB (ref 27–31.2)
MCHC: 32.9 g/dL (ref 31.8–35.4)
MCV: 95.6 fL (ref 80–97)
MID (cbc): 0.5 (ref 0–0.9)
MPV: 6.5 fL (ref 0–99.8)
POC Granulocyte: 7.4 — AB (ref 2–6.9)
POC LYMPH PERCENT: 22.6 %L (ref 10–50)
POC MID %: 5.2 %M (ref 0–12)
Platelet Count, POC: 342 10*3/uL (ref 142–424)
RBC: 5.2 M/uL (ref 4.04–5.48)
RDW, POC: 12.8 %
WBC: 10.2 10*3/uL (ref 4.6–10.2)

## 2015-01-26 LAB — POCT UA - MICROSCOPIC ONLY
Casts, Ur, LPF, POC: NEGATIVE
Crystals, Ur, HPF, POC: NEGATIVE
Mucus, UA: NEGATIVE
RBC, urine, microscopic: NEGATIVE
Yeast, UA: NEGATIVE

## 2015-01-26 LAB — COMPREHENSIVE METABOLIC PANEL
ALT: 54 U/L — ABNORMAL HIGH (ref 0–35)
AST: 41 U/L — ABNORMAL HIGH (ref 0–37)
Albumin: 4.7 g/dL (ref 3.5–5.2)
Alkaline Phosphatase: 91 U/L (ref 39–117)
BUN: 13 mg/dL (ref 6–23)
CO2: 23 mEq/L (ref 19–32)
Calcium: 9.2 mg/dL (ref 8.4–10.5)
Chloride: 101 mEq/L (ref 96–112)
Creat: 0.67 mg/dL (ref 0.50–1.10)
Glucose, Bld: 123 mg/dL — ABNORMAL HIGH (ref 70–99)
Potassium: 4.4 mEq/L (ref 3.5–5.3)
Sodium: 137 mEq/L (ref 135–145)
Total Bilirubin: 0.5 mg/dL (ref 0.2–1.2)
Total Protein: 6.8 g/dL (ref 6.0–8.3)

## 2015-01-26 MED ORDER — CIPROFLOXACIN HCL 500 MG PO TABS: 500.0000 mg | ORAL_TABLET | Freq: Two times a day (BID) | ORAL | Status: AC

## 2015-01-26 MED ORDER — IOHEXOL 300 MG/ML  SOLN
100.0000 mL | Freq: Once | INTRAMUSCULAR | Status: AC | PRN
  Administered 2015-01-26: 100 mL via INTRAVENOUS

## 2015-01-26 MED ORDER — METRONIDAZOLE 500 MG PO TABS: 500.0000 mg | ORAL_TABLET | Freq: Two times a day (BID) | ORAL | Status: AC

## 2015-01-26 NOTE — Progress Notes (Signed)
Patient ID: Jessica Knox, female    DOB: September 25, 1966, 49 y.o.   MRN: [id number]  PCP: Emi Belfast, FNP  Subjective:   Chief Complaint  Patient presents with  . Abdominal Pain    post op   . Chills    HPI Presents for evaluation of abdominal pain 2 weeks following surgery for lysis of adhesions and LEFT oophorectomy. Has had ovarian cysts since adolescence.  Hysterectomy at age 75 years. 2 years ago had surgery to remove  RIGHT ovary and tube after acute rupture resulted in scar tissue adhering to the large intestine. Large cyst was noted on the LEFT, but the surgeon did not want to remove it and leave her without either ovary. She developed scar tissue/adhesions quickly, and has been having daily pain x 18 months.  On 01/10/2015 she had robotic daVinci procedure to remove the LEFT ovary and lyse the adhesions, including adhesions on the appendix, which was not removed.   She was initially feeling better, and came back to work 3 days ago. She was fatigued, and only working 4 hour shifts.  Yesterday afternoon, while seated at her desk, she developed severe (8/10) RLQ abdominal pain. Stabbing pain. Pain spread throughout her abdomen, but remains worse in the RLQ. Presently rates it a 6. No nausea, vomiting. Reports fever, that her normal temperature is 97.5, and that her axillary temperature last night was 98.6. Passing stool/gas.   Has had an E. Coli UTI (reportedly since December 2015). "I have a nurses' bladder." No increased frequency, urgency or burning. No unusual odor. No atypical vaginal discharge.  Has stress incontinence at baseline.   Review of Systems Review of Systems As above.    Patient Active Problem List   Diagnosis Date Noted  . Smoker 01/26/2015  . BMI 28.0-28.9,adult 01/26/2015  . Adnexal pain 12/17/2014  . Abdominal pain 04/24/2013  . Pancreatitis 04/24/2013     Prior to Admission medications   Medication Sig Start Date End Date Taking? Authorizing  Provider  ALPRAZolam Prudy Feeler) 0.5 MG tablet Take 0.5 mg by mouth at bedtime as needed for anxiety.   Yes Historical Provider, MD  amphetamine-dextroamphetamine (ADDERALL XR) 20 MG 24 hr capsule Take 1 capsule (20 mg total) by mouth 2 (two) times daily. 11/02/14  Yes Emi Belfast, FNP  cetirizine (ZYRTEC) 10 MG tablet Take 10 mg by mouth daily.   Yes Historical Provider, MD  diltiazem (CARDIZEM CD) 180 MG 24 hr capsule Take 1 capsule (180 mg total) by mouth daily. 12/04/14  Yes Emi Belfast, FNP  esomeprazole (NEXIUM) 40 MG capsule Take 1 capsule (40 mg total) by mouth daily at 12 noon. 11/30/14  Yes Emi Belfast, FNP  Lactobacillus (ULTIMATE PROBIOTIC FORMULA) CAPS Take 25 each by mouth. 25 billion dose/ 10 probiotics ultimate flora   Yes Historical Provider, MD  levothyroxine (SYNTHROID, LEVOTHROID) 125 MCG tablet Take 1 tablet (125 mcg total) by mouth daily. 11/03/14  Yes Emi Belfast, FNP  meloxicam (MOBIC) 15 MG tablet Take 1 tablet (15 mg total) by mouth daily. 01/25/15  Yes Emi Belfast, FNP  oxycodone (OXY-IR) 5 MG capsule Take 1 capsule (5 mg total) by mouth every 4 (four) hours as needed. 01/25/15  Yes Emi Belfast, FNP  sertraline (ZOLOFT) 100 MG tablet Take 1.5 tablets (150 mg total) by mouth daily. 01/24/15  Yes Emi Belfast, FNP  spironolactone (ALDACTONE) 25 MG tablet Take 1 tablet (25 mg total) by mouth daily. 12/04/14  Yes  Emi Belfast, FNP  fluconazole (DIFLUCAN) 150 MG tablet Take 1 tablet (150 mg total) by mouth once. Repeat if needed Patient not taking: Reported on 01/26/2015 01/03/15   Emi Belfast, FNP  ondansetron (ZOFRAN-ODT) 8 MG disintegrating tablet Take 1 tablet (8 mg total) by mouth every 8 (eight) hours as needed for nausea. Patient not taking: Reported on 01/26/2015 01/11/15   Emi Belfast, FNP     Allergies  Allergen Reactions  . Dilaudid [Hydromorphone Hcl]   . Morphine And Related Itching  . Prednisone   . Ciprofloxacin  Palpitations       Objective:  Physical Exam  Physical Exam  Constitutional: She is oriented to person, place, and time. Vital signs are normal. She appears well-developed and well-nourished. She is active and cooperative. She appears ill. No distress.  BP 134/82 mmHg  Pulse 118  Temp(Src) 98 F (36.7 C) (Oral)  Resp 17  Ht 5' 0.5" (1.537 m)  Wt 148 lb (67.132 kg)  BMI 28.42 kg/m2  SpO2 98%  HENT:  Head: Normocephalic and atraumatic.  Right Ear: Hearing normal.  Left Ear: Hearing normal.  Eyes: Conjunctivae are normal. No scleral icterus.  Neck: Normal range of motion. Neck supple. No thyromegaly present.  Cardiovascular: Normal rate, regular rhythm and normal heart sounds.   Pulses:      Radial pulses are 2+ on the right side, and 2+ on the left side.  Pulmonary/Chest: Effort normal and breath sounds normal.  Abdominal: Soft. Bowel sounds are normal. She exhibits distension (mildly-"bloated"). She exhibits no shifting dullness, no pulsatile liver, no fluid wave, no abdominal bruit, no ascites, no pulsatile midline mass and no mass. There is no hepatosplenomegaly. There is generalized tenderness (worst in the RLQ, epigastrum and LUQ). There is no rigidity, no rebound, no guarding and no CVA tenderness. No hernia.  Lymphadenopathy:       Head (right side): No tonsillar, no preauricular, no posterior auricular and no occipital adenopathy present.       Head (left side): No tonsillar, no preauricular, no posterior auricular and no occipital adenopathy present.    She has no cervical adenopathy.       Right: No supraclavicular adenopathy present.       Left: No supraclavicular adenopathy present.  Neurological: She is alert and oriented to person, place, and time. No sensory deficit.  Skin: Skin is warm, dry and intact. No rash noted. No cyanosis or erythema. Nails show no clubbing.  Psychiatric: She has a normal mood and affect.       Results for orders placed or performed in  visit on 01/26/15  POCT urinalysis dipstick  Result Value Ref Range   Color, UA yellow    Clarity, UA cloudy    Glucose, UA neg    Bilirubin, UA neg    Ketones, UA neg    Spec Grav, UA 1.015    Blood, UA neg    pH, UA 7.0    Protein, UA neg    Urobilinogen, UA 0.2    Nitrite, UA positive    Leukocytes, UA Trace   POCT UA - Microscopic Only  Result Value Ref Range   WBC, Ur, HPF, POC 0-2    RBC, urine, microscopic neg    Bacteria, U Microscopic 4+    Mucus, UA neg    Epithelial cells, urine per micros 0-1    Crystals, Ur, HPF, POC neg    Casts, Ur, LPF, POC neg    Yeast,  UA neg   POCT CBC  Result Value Ref Range   WBC 10.2 4.6 - 10.2 K/uL   Lymph, poc 2.3 0.6 - 3.4   POC LYMPH PERCENT 22.6 10 - 50 %L   MID (cbc) 0.5 0 - 0.9   POC MID % 5.2 0 - 12 %M   POC Granulocyte 7.4 (A) 2 - 6.9   Granulocyte percent 72.2 37 - 80 %G   RBC 5.20 4.04 - 5.48 M/uL   Hemoglobin 16.4 (A) 12.2 - 16.2 g/dL   HCT, POC 40.949.7 (A) 81.137.7 - 47.9 %   MCV 95.6 80 - 97 fL   MCH, POC 31.4 (A) 27 - 31.2 pg   MCHC 32.9 31.8 - 35.4 g/dL   RDW, POC 91.412.8 %   Platelet Count, POC 342 142 - 424 K/uL   MPV 6.5 0 - 99.8 fL   Acute Abdominal Series: [facility] reading (PRIMARY) by  Dr. Patsy Lageropland. No free air. No air-fluid levels. No masses. Normal abdominal series.      Assessment & Plan:   1. Generalized abdominal pain Concerning that she is 2 weeks post-op. CT scan today to assess for appendicitis vs. Post-operative complication.  - POCT urinalysis dipstick - POCT UA - Microscopic Only - POCT CBC - Comprehensive metabolic panel - DG Abd Acute W/Chest - CT Abdomen Pelvis W Contrast; Future  2. Bacteria in urine Asymptomatic. Await UCx. If she develops any urinary symptoms, will initiate treatment. - Urine culture   Fernande Brashelle S. Celestia Duva, PA-C Physician Assistant-Certified [facility]

## 2015-01-26 NOTE — Telephone Encounter (Addendum)
Call report of CT scan abdomen/pelvis. Colitis extending from the ascending colon to the proximal transverse colon. Distal colon is normal. No evidence of ischemic bowel. No abscess. Recent surgery noted. Hepatosteatosis.  Spoke with the patient and advised of the results.  She has oxycodone at home.  Cipro + Flagyl x 10 days.  Follow up with me in the morning. To the ED if symptoms worsen over night.

## 2015-01-27 ENCOUNTER — Encounter: Payer: Self-pay | Admitting: Physician Assistant

## 2015-01-27 ENCOUNTER — Telehealth: Payer: Self-pay | Admitting: Physician Assistant

## 2015-01-27 NOTE — Telephone Encounter (Signed)
See MyChart message

## 2015-01-27 NOTE — Telephone Encounter (Signed)
Called patient to check on status. She was supposed to RTC today to see me.

## 2015-01-28 LAB — URINE CULTURE

## 2015-01-31 MED ORDER — NITROFURANTOIN MACROCRYSTAL 100 MG PO CAPS: 100.0000 mg | ORAL_CAPSULE | Freq: Four times a day (QID) | ORAL | Status: DC

## 2015-01-31 MED ORDER — NITROFURANTOIN MACROCRYSTAL 100 MG PO CAPS: 100.0000 mg | ORAL_CAPSULE | Freq: Two times a day (BID) | ORAL | Status: AC

## 2015-01-31 NOTE — Addendum Note (Signed)
Addended by: Fernande BrasJEFFERY, Landi Biscardi S on: 01/31/2015 10:20 AM   Modules accepted: Orders

## 2015-02-01 ENCOUNTER — Encounter: Payer: Self-pay | Admitting: Family Medicine

## 2015-02-01 ENCOUNTER — Ambulatory Visit (INDEPENDENT_AMBULATORY_CARE_PROVIDER_SITE_OTHER): Payer: BLUE CROSS/BLUE SHIELD | Admitting: Family Medicine

## 2015-02-01 VITALS — BP 126/83 | HR 120 | Temp 98.3°F | Resp 20 | Ht 60.0 in | Wt 145.0 lb

## 2015-02-01 DIAGNOSIS — R1084 Generalized abdominal pain: Secondary | ICD-10-CM

## 2015-02-01 NOTE — Patient Instructions (Signed)
Eat small, frequent, bland meals  Gradually increase activity- spread it out throughout the day.

## 2015-02-01 NOTE — Progress Notes (Signed)
   Subjective:    Patient ID: Jessica Knox, female    DOB: [birth year], 49 y.o.   MRN: [id number]  HPI Patient presents today for follow up of abdominal pain/colitis. She continues to have abdominal pain and is only able to eat jello, pimento cheese, fruit cups, although she did eat a red velvet cake roll over the past several days without increased pain/nausea/vomiting.  Pain is generalized and sometimes epigastric. Bowel movements regular and brown. Gas pains worse. Taking oxy for pain BID and mobic with good pain relief.  Started cipro/flagyl 7 days ago, was able to tolerate. Feels like she is slowly making progress.  She denies anxiety but reports multiple stressors including potential job loss due to missed work. She reports that she has been running a low grade fever but has not taken her temperature. She "runs low." She has been sleeping a lot during the day and her daytime activity has been limited; she has rarely left her house in last 7 days. Her fiance does not want her to do any housework.   Review of Systems  Subjective low grade fever, nausea, no vomiting. No diarrhea, no constipation, no flank pain. Chronic low back pain.     Objective:   Physical Exam  Constitutional: She is oriented to person, place, and time. She appears well-developed and well-nourished.  HENT:  Head: Normocephalic and atraumatic.  Eyes: Conjunctivae are normal.  Neck: Normal range of motion. Neck supple.  Cardiovascular: Regular rhythm and normal heart sounds.  Tachycardia present.   Pulmonary/Chest: Effort normal and breath sounds normal.  Abdominal: Soft. Bowel sounds are normal. She exhibits distension. There is generalized tenderness. There is no rebound.  Musculoskeletal: Normal range of motion. She exhibits no edema.  Neurological: She is alert and oriented to person, place, and time.  Skin: Skin is warm and dry.  Psychiatric: She has a normal mood and affect. Her behavior is normal. Judgment and  thought content normal.  Vitals reviewed.  BP 126/83 mmHg  Pulse 120  Temp(Src) 98.3 F (36.8 C) (Oral)  Resp 20  Ht 5' (1.524 m)  Wt 145 lb (65.772 kg)  BMI 28.32 kg/m2  SpO2 97% Recheck pulse- 108    Assessment & Plan:  1. Generalized abdominal pain - continue current pain relief measures. Encouraged patient to limit oxycodone use.  - gradual increase in activity and return to regular schedule, limit daytime sleeping, slowly increase physical activity to increase indurance - follow up with gyn as scheduled 02/10/15 - will provided note for OOW through this week.   Emi Belfasteborah B. Gessner, FNP-BC  [facility]  02/02/2015 9:10 PM

## 2015-02-02 ENCOUNTER — Encounter: Payer: Self-pay | Admitting: Family Medicine

## 2015-02-03 NOTE — Progress Notes (Signed)
Still unclear what is going on. Tachycardia is an issue. What are your thoughts.I know this is a complex situation

## 2015-02-09 ENCOUNTER — Other Ambulatory Visit: Payer: Self-pay | Admitting: Family Medicine

## 2015-02-09 DIAGNOSIS — F988 Other specified behavioral and emotional disorders with onset usually occurring in childhood and adolescence: Secondary | ICD-10-CM

## 2015-02-09 MED ORDER — AMPHETAMINE-DEXTROAMPHET ER 20 MG PO CP24: 20.0000 mg | ORAL_CAPSULE | ORAL | Status: DC

## 2015-02-09 MED ORDER — AMPHETAMINE-DEXTROAMPHET ER 20 MG PO CP24: 20.0000 mg | ORAL_CAPSULE | Freq: Two times a day (BID) | ORAL | Status: DC

## 2015-02-09 NOTE — Progress Notes (Unsigned)
Patient requesting refill of Adderall. Doing well on current dose. Incorrect number on initial prescription. Corrected and reprinted. First set of prescriptions destroyed.

## 2015-02-21 ENCOUNTER — Encounter: Payer: Self-pay | Admitting: Family Medicine

## 2015-02-22 ENCOUNTER — Other Ambulatory Visit: Payer: Self-pay | Admitting: Family Medicine

## 2015-02-23 NOTE — Telephone Encounter (Signed)
Debbie, I don't see that you have Rxd this for pt before?

## 2015-03-01 ENCOUNTER — Encounter: Payer: Self-pay | Admitting: Family Medicine

## 2015-03-01 ENCOUNTER — Ambulatory Visit (INDEPENDENT_AMBULATORY_CARE_PROVIDER_SITE_OTHER): Payer: BLUE CROSS/BLUE SHIELD | Admitting: Family Medicine

## 2015-03-01 VITALS — BP 142/88 | HR 109 | Temp 98.3°F | Resp 16 | Ht 59.0 in | Wt 142.8 lb

## 2015-03-01 DIAGNOSIS — M791 Myalgia, unspecified site: Secondary | ICD-10-CM

## 2015-03-01 DIAGNOSIS — Z720 Tobacco use: Secondary | ICD-10-CM

## 2015-03-01 DIAGNOSIS — R1030 Lower abdominal pain, unspecified: Secondary | ICD-10-CM | POA: Diagnosis not present

## 2015-03-01 LAB — CBC
HCT: 45.4 % (ref 36.0–46.0)
HEMOGLOBIN: 15.9 g/dL — AB (ref 12.0–15.0)
MCH: 32.3 pg (ref 26.0–34.0)
MCHC: 35 g/dL (ref 30.0–36.0)
MCV: 92.3 fL (ref 78.0–100.0)
MPV: 9.1 fL (ref 8.6–12.4)
Platelets: 232 10*3/uL (ref 150–400)
RBC: 4.92 MIL/uL (ref 3.87–5.11)
RDW: 13.1 % (ref 11.5–15.5)
WBC: 10.8 10*3/uL — ABNORMAL HIGH (ref 4.0–10.5)

## 2015-03-01 LAB — POCT URINALYSIS DIPSTICK
BILIRUBIN UA: NEGATIVE
Blood, UA: NEGATIVE
Glucose, UA: NEGATIVE
Ketones, UA: NEGATIVE
LEUKOCYTES UA: NEGATIVE
Nitrite, UA: NEGATIVE
Protein, UA: NEGATIVE
Spec Grav, UA: 1.02
UROBILINOGEN UA: 0.2
pH, UA: 7

## 2015-03-01 LAB — POCT UA - MICROSCOPIC ONLY
BACTERIA, U MICROSCOPIC: NEGATIVE
CRYSTALS, UR, HPF, POC: NEGATIVE
Casts, Ur, LPF, POC: NEGATIVE
Epithelial cells, urine per micros: NEGATIVE
Mucus, UA: NEGATIVE

## 2015-03-01 LAB — COMPREHENSIVE METABOLIC PANEL
ALT: 75 U/L — ABNORMAL HIGH (ref 0–35)
AST: 55 U/L — ABNORMAL HIGH (ref 0–37)
Albumin: 4.6 g/dL (ref 3.5–5.2)
Alkaline Phosphatase: 78 U/L (ref 39–117)
BUN: 10 mg/dL (ref 6–23)
CHLORIDE: 99 meq/L (ref 96–112)
CO2: 23 mEq/L (ref 19–32)
CREATININE: 0.67 mg/dL (ref 0.50–1.10)
Calcium: 9.1 mg/dL (ref 8.4–10.5)
Glucose, Bld: 104 mg/dL — ABNORMAL HIGH (ref 70–99)
Potassium: 4.2 mEq/L (ref 3.5–5.3)
Sodium: 138 mEq/L (ref 135–145)
Total Bilirubin: 0.6 mg/dL (ref 0.2–1.2)
Total Protein: 7.1 g/dL (ref 6.0–8.3)

## 2015-03-01 NOTE — Progress Notes (Signed)
Subjective:    Patient ID: Jessica Knox, female    DOB: [birth year], [age] y.o.   MRN: [id number]  HPI Patient presents today with continued post op surgical pain following her exploratory laparotomy 7 weeks ago. She had generalized cramping and stabbing right sided pain prior to her surgery. Since her surgery, she has noticed that she has more lower abdominal pressure and worsening stress and non-stress urinary incontinence. Has had to start wearing incontinence pads.  Has not had intercourse since her surgery.Feels like she has to hold her belly up for support. Pain associated with standing and walking. Sitting does not exacerbate pain. Pain in upper abdomen when she presses on it, otherwise, the pain is more in her lower abdomen. Pain is getting worse since her surgery. She is taking daily meloxicam and not sure if it is helping. Takes oxy-ir sparingly (last prescription for #30 given 01/25/15 and she reports she has some remaining).  Bowel movements have been inconsistent- sometimes loose, sometimes she is constipated. Avoids straining.  Has history of chronic UTI. Was put on daily nitofurantoin. No dysuria. Gets up 1-2 x a night to urinate. Chronic low back pain, worse when her weight is up like it is now. Her uro-gyn surgeon gave her some phentermine which she states she is rarely taking.   CT abdomen/pelvis with contrast 01/26/15 showed colitis from ascending colon into proximal transverse colon. Hepatosteatosis. At that time, patient did not have any diarrhea.   Had colonoscopy last fall, Dr. Loreta AveMann, polyps found and removed with 3 year follow up recommended.   Review of Systems No vaginal discharge, no dysuria, no documented temperature, subjective low grade fevers- increasing frequency, feels malaise and body aches. Continues to have intermittent nausea- relieved with zofran. Has taken about 5 in the last 2 months.     Objective:   Physical Exam  Constitutional: She is oriented to person,  place, and time. She appears well-developed and well-nourished.  HENT:  Head: Normocephalic and atraumatic.  Eyes: Conjunctivae are normal.  Neck: Normal range of motion. Neck supple.  Cardiovascular: Regular rhythm and normal heart sounds.  Tachycardia present.   Pulmonary/Chest: Effort normal and breath sounds normal.  Abdominal: Bowel sounds are normal. She exhibits no mass. Distention: mildly. There is generalized tenderness. There is no rigidity, no rebound and no guarding.  Genitourinary: Pelvic exam was performed with patient supine. There is no rash, tenderness, lesion or injury on the right labia. There is no rash, tenderness, lesion or injury on the left labia. No erythema, tenderness or bleeding in the vagina. No foreign body around the vagina. No signs of injury around the vagina. No vaginal discharge found.  Vaginal cuff unremarkable, no cystocele, no rectocele. Good vaginal tone, no incontinence with bearing down.   Musculoskeletal: Normal range of motion.  Lymphadenopathy:       Right: No inguinal adenopathy present.       Left: No inguinal adenopathy present.  Neurological: She is alert and oriented to person, place, and time.  Skin: Skin is warm and dry.  Psychiatric: She has a normal mood and affect. Her behavior is normal. Judgment and thought content normal.  Vitals reviewed.  BP 142/88 mmHg  Pulse 109  Temp(Src) 98.3 F (36.8 C) (Oral)  Resp 16  Ht 4\' 11"  (1.499 m)  Wt 142 lb 12.8 oz (64.774 kg)  BMI 28.83 kg/m2  SpO2 97%     Assessment & Plan:  Discussed with Dr. Cleta Albertsaub 1. Lower abdominal pain - POCT  urinalysis dipstick - POCT UA - Microscopic Only - CBC - Comprehensive metabolic panel - Sedimentation Rate - Discussed persistent nature of her pain with unknown cause. Recommended further evaluation by GI/gyn or urology. Patient interested in seeing someone locally as it is difficult to get to [facility] for follow up. She will let me know what she decides.    2. Myalgia - CBC - Comprehensive metabolic panel - Sedimentation Rate  3. Tobacco abuse - Encouraged her to consider smoking cessation - Patient reports she is under great deal of stress with recent missed work, pain, ongoing issues with ex-husband. Discouraged her from using phentermine with adderall given her baseline tachycardia and anxiety. Encouraged healthy stress relief measures and continued counseling with Karmen Bongo.   Olean Ree, FNP-BC  [facility], [facility]  03/03/2015 9:11 AM

## 2015-03-02 LAB — SEDIMENTATION RATE: SED RATE: 4 mm/h (ref 0–20)

## 2015-03-25 ENCOUNTER — Other Ambulatory Visit: Payer: Self-pay | Admitting: Family Medicine

## 2015-03-25 ENCOUNTER — Encounter: Payer: Self-pay | Admitting: Family Medicine

## 2015-03-25 MED ORDER — ESTRADIOL 1 MG PO TABS: 1.0000 mg | ORAL_TABLET | Freq: Every day | ORAL | Status: DC

## 2015-03-25 NOTE — Progress Notes (Unsigned)
Patient requesting refill of estrogen replacement. Was recently increased to 1 mg po qd by gyn.

## 2015-03-27 ENCOUNTER — Encounter: Payer: Self-pay | Admitting: Family Medicine

## 2015-03-29 ENCOUNTER — Other Ambulatory Visit: Payer: Self-pay | Admitting: *Deleted

## 2015-03-29 ENCOUNTER — Other Ambulatory Visit: Payer: Self-pay | Admitting: Family Medicine

## 2015-03-29 DIAGNOSIS — R110 Nausea: Secondary | ICD-10-CM

## 2015-03-29 MED ORDER — ONDANSETRON 8 MG PO TBDP: 8.0000 mg | ORAL_TABLET | Freq: Three times a day (TID) | ORAL | Status: DC | PRN

## 2015-03-29 NOTE — Telephone Encounter (Signed)
Spoke with patient at length in person regarding her continued depression and some of the stressors she is currently experiencing. She will be seeing her therapist soon and will discuss possible medication change. Encouraged increased exercise, avoid alcohol with its CNS depressive qualities and sleep disturbance.

## 2015-03-29 NOTE — Progress Notes (Signed)
Patient out of work today and is requesting Zofran for nausea. She states her pharmacy would only fill 9 of her last RX.

## 2015-04-27 ENCOUNTER — Other Ambulatory Visit: Payer: Self-pay | Admitting: Family Medicine

## 2015-04-27 DIAGNOSIS — R1030 Lower abdominal pain, unspecified: Secondary | ICD-10-CM

## 2015-04-27 DIAGNOSIS — F4323 Adjustment disorder with mixed anxiety and depressed mood: Secondary | ICD-10-CM

## 2015-04-27 MED ORDER — MELOXICAM 15 MG PO TABS: 15.0000 mg | ORAL_TABLET | Freq: Every day | ORAL | Status: DC | PRN

## 2015-04-27 MED ORDER — ALPRAZOLAM 0.5 MG PO TABS: 0.5000 mg | ORAL_TABLET | Freq: Every day | ORAL | Status: DC | PRN

## 2015-05-04 ENCOUNTER — Other Ambulatory Visit: Payer: Self-pay | Admitting: Family Medicine

## 2015-05-04 DIAGNOSIS — F988 Other specified behavioral and emotional disorders with onset usually occurring in childhood and adolescence: Secondary | ICD-10-CM

## 2015-05-04 MED ORDER — AMPHETAMINE-DEXTROAMPHET ER 20 MG PO CP24
20.0000 mg | ORAL_CAPSULE | Freq: Two times a day (BID) | ORAL | Status: DC
Start: 2015-05-04 — End: 2015-08-17

## 2015-05-04 MED ORDER — AMPHETAMINE-DEXTROAMPHET ER 20 MG PO CP24: 20.0000 mg | ORAL_CAPSULE | Freq: Two times a day (BID) | ORAL | Status: DC

## 2015-05-16 ENCOUNTER — Other Ambulatory Visit: Payer: Self-pay | Admitting: Family Medicine

## 2015-05-24 ENCOUNTER — Other Ambulatory Visit: Payer: Self-pay | Admitting: Family Medicine

## 2015-05-25 ENCOUNTER — Other Ambulatory Visit: Payer: Self-pay | Admitting: Family Medicine

## 2015-05-25 DIAGNOSIS — R1030 Lower abdominal pain, unspecified: Secondary | ICD-10-CM

## 2015-05-25 MED ORDER — SERTRALINE HCL 100 MG PO TABS: 150.0000 mg | ORAL_TABLET | Freq: Every day | ORAL | Status: DC

## 2015-05-25 MED ORDER — OXYCODONE HCL 5 MG PO CAPS: 5.0000 mg | ORAL_CAPSULE | ORAL | Status: DC | PRN

## 2015-05-25 MED ORDER — DILTIAZEM HCL ER COATED BEADS 180 MG PO CP24: 180.0000 mg | ORAL_CAPSULE | Freq: Every day | ORAL | Status: DC

## 2015-05-25 NOTE — Progress Notes (Unsigned)
Patient has been having much less frequent abdominal pain, but reports occasional pain not relieved with NSAIDs, requesting small amount Oxycodone to have on hand for severe pain. Also needs refills on Zoloft and Cardizem. Refills completed.

## 2015-06-17 ENCOUNTER — Encounter: Payer: Self-pay | Admitting: Family Medicine

## 2015-06-20 ENCOUNTER — Other Ambulatory Visit (INDEPENDENT_AMBULATORY_CARE_PROVIDER_SITE_OTHER): Payer: BLUE CROSS/BLUE SHIELD

## 2015-06-20 ENCOUNTER — Encounter: Payer: Self-pay | Admitting: Family Medicine

## 2015-06-20 ENCOUNTER — Other Ambulatory Visit: Payer: Self-pay | Admitting: Family Medicine

## 2015-06-20 DIAGNOSIS — N390 Urinary tract infection, site not specified: Secondary | ICD-10-CM

## 2015-06-20 DIAGNOSIS — R300 Dysuria: Secondary | ICD-10-CM

## 2015-06-20 DIAGNOSIS — N3090 Cystitis, unspecified without hematuria: Secondary | ICD-10-CM

## 2015-06-20 LAB — POCT URINALYSIS DIPSTICK
Blood, UA: NEGATIVE
Glucose, UA: 250
Ketones, UA: 15
NITRITE UA: POSITIVE
Protein, UA: 100
Spec Grav, UA: 1.01
pH, UA: 5

## 2015-06-20 LAB — POCT UA - MICROSCOPIC ONLY
Crystals, Ur, HPF, POC: NEGATIVE
MUCUS UA: POSITIVE
Yeast, UA: NEGATIVE

## 2015-06-20 MED ORDER — CIPROFLOXACIN HCL 250 MG PO TABS: 250.0000 mg | ORAL_TABLET | Freq: Two times a day (BID) | ORAL | Status: DC

## 2015-06-20 MED ORDER — PHENAZOPYRIDINE HCL 200 MG PO TABS: 200.0000 mg | ORAL_TABLET | Freq: Three times a day (TID) | ORAL | Status: DC | PRN

## 2015-06-20 NOTE — Progress Notes (Signed)
Pt is here for lab work only. 

## 2015-06-20 NOTE — Progress Notes (Signed)
Patient reports urinary frequency, dribbling, lower abdominal pain for 3 days. Felt warm. No kidney pain. No vomiting. UA and culture obtained.  Results for orders placed or performed in visit on 03/01/15  CBC  Result Value Ref Range   WBC 10.8 (H) 4.0 - 10.5 K/uL   RBC 4.92 3.87 - 5.11 MIL/uL   Hemoglobin 15.9 (H) 12.0 - 15.0 g/dL   HCT 16.1 09.6 - 04.5 %   MCV 92.3 78.0 - 100.0 fL   MCH 32.3 26.0 - 34.0 pg   MCHC 35.0 30.0 - 36.0 g/dL   RDW 40.9 81.1 - 91.4 %   Platelets 232 150 - 400 K/uL   MPV 9.1 8.6 - 12.4 fL  Comprehensive metabolic panel  Result Value Ref Range   Sodium 138 135 - 145 mEq/L   Potassium 4.2 3.5 - 5.3 mEq/L   Chloride 99 96 - 112 mEq/L   CO2 23 19 - 32 mEq/L   Glucose, Bld 104 (H) 70 - 99 mg/dL   BUN 10 6 - 23 mg/dL   Creat 7.82 9.56 - 2.13 mg/dL   Total Bilirubin 0.6 0.2 - 1.2 mg/dL   Alkaline Phosphatase 78 39 - 117 U/L   AST 55 (H) 0 - 37 U/L   ALT 75 (H) 0 - 35 U/L   Total Protein 7.1 6.0 - 8.3 g/dL   Albumin 4.6 3.5 - 5.2 g/dL   Calcium 9.1 8.4 - 08.6 mg/dL  Sedimentation Rate  Result Value Ref Range   Sed Rate 4 0 - 20 mm/hr  POCT urinalysis dipstick  Result Value Ref Range   Color, UA Amber    Clarity, UA Slightly cloudy    Glucose, UA Negative    Bilirubin, UA Negative    Ketones, UA Negative    Spec Grav, UA 1.020    Blood, UA Negative    pH, UA 7.0    Protein, UA negative    Urobilinogen, UA 0.2    Nitrite, UA negative    Leukocytes, UA Negative   POCT UA - Microscopic Only  Result Value Ref Range   WBC, Ur, HPF, POC 0-3    RBC, urine, microscopic 0-2    Bacteria, U Microscopic Negative    Mucus, UA Negative    Epithelial cells, urine per micros Negative    Crystals, Ur, HPF, POC Negative    Casts, Ur, LPF, POC Negative    Yeast, UA    Cipro 250 BID x 5 days sent to pharmacy.

## 2015-06-22 ENCOUNTER — Other Ambulatory Visit: Payer: Self-pay | Admitting: Urgent Care

## 2015-06-22 ENCOUNTER — Other Ambulatory Visit: Payer: Self-pay | Admitting: Family Medicine

## 2015-06-22 LAB — URINE CULTURE: Colony Count: 10000

## 2015-06-27 ENCOUNTER — Other Ambulatory Visit: Payer: Self-pay | Admitting: Family Medicine

## 2015-06-27 DIAGNOSIS — N390 Urinary tract infection, site not specified: Secondary | ICD-10-CM

## 2015-06-27 DIAGNOSIS — Z87442 Personal history of urinary calculi: Secondary | ICD-10-CM

## 2015-07-14 ENCOUNTER — Other Ambulatory Visit: Payer: Self-pay | Admitting: Urgent Care

## 2015-07-14 ENCOUNTER — Encounter: Payer: Self-pay | Admitting: Family Medicine

## 2015-07-14 ENCOUNTER — Other Ambulatory Visit: Payer: Self-pay

## 2015-07-14 MED ORDER — SPIRONOLACTONE 25 MG PO TABS: 25.0000 mg | ORAL_TABLET | Freq: Every day | ORAL | Status: DC

## 2015-07-14 NOTE — Telephone Encounter (Signed)
See Refill encounter under 07/14/15 date.

## 2015-07-14 NOTE — Telephone Encounter (Signed)
Jessica Knox req'd RFs of her aldactone and combivent. She was out of aldactone, so I gave her 1 mos worth. I don't see when it was discussed. If you want her to have more RFs, please send in another Rx with more RFs for them to put on file. Pt reports that she needs her combivent filled that you have not filled before.

## 2015-08-03 ENCOUNTER — Encounter: Payer: Self-pay | Admitting: Family Medicine

## 2015-08-06 ENCOUNTER — Other Ambulatory Visit: Payer: Self-pay | Admitting: Family Medicine

## 2015-08-17 ENCOUNTER — Ambulatory Visit (INDEPENDENT_AMBULATORY_CARE_PROVIDER_SITE_OTHER): Payer: BLUE CROSS/BLUE SHIELD | Admitting: Family Medicine

## 2015-08-17 ENCOUNTER — Encounter: Payer: Self-pay | Admitting: Family Medicine

## 2015-08-17 VITALS — BP 146/88 | HR 106 | Temp 97.7°F | Resp 16 | Ht 59.5 in | Wt 148.4 lb

## 2015-08-17 DIAGNOSIS — E034 Atrophy of thyroid (acquired): Secondary | ICD-10-CM

## 2015-08-17 DIAGNOSIS — Z23 Encounter for immunization: Secondary | ICD-10-CM

## 2015-08-17 DIAGNOSIS — F988 Other specified behavioral and emotional disorders with onset usually occurring in childhood and adolescence: Secondary | ICD-10-CM

## 2015-08-17 DIAGNOSIS — R945 Abnormal results of liver function studies: Secondary | ICD-10-CM

## 2015-08-17 DIAGNOSIS — F4323 Adjustment disorder with mixed anxiety and depressed mood: Secondary | ICD-10-CM | POA: Diagnosis not present

## 2015-08-17 DIAGNOSIS — R7309 Other abnormal glucose: Secondary | ICD-10-CM

## 2015-08-17 DIAGNOSIS — R1030 Lower abdominal pain, unspecified: Secondary | ICD-10-CM | POA: Diagnosis not present

## 2015-08-17 DIAGNOSIS — E038 Other specified hypothyroidism: Secondary | ICD-10-CM | POA: Diagnosis not present

## 2015-08-17 DIAGNOSIS — F909 Attention-deficit hyperactivity disorder, unspecified type: Secondary | ICD-10-CM

## 2015-08-17 DIAGNOSIS — Z0000 Encounter for general adult medical examination without abnormal findings: Secondary | ICD-10-CM

## 2015-08-17 DIAGNOSIS — R7989 Other specified abnormal findings of blood chemistry: Secondary | ICD-10-CM | POA: Diagnosis not present

## 2015-08-17 DIAGNOSIS — E559 Vitamin D deficiency, unspecified: Secondary | ICD-10-CM

## 2015-08-17 DIAGNOSIS — R5382 Chronic fatigue, unspecified: Secondary | ICD-10-CM

## 2015-08-17 DIAGNOSIS — R300 Dysuria: Secondary | ICD-10-CM | POA: Diagnosis not present

## 2015-08-17 DIAGNOSIS — E785 Hyperlipidemia, unspecified: Secondary | ICD-10-CM

## 2015-08-17 LAB — POCT URINALYSIS DIP (MANUAL ENTRY)
Bilirubin, UA: NEGATIVE
GLUCOSE UA: NEGATIVE
Ketones, POC UA: NEGATIVE
LEUKOCYTES UA: NEGATIVE
NITRITE UA: NEGATIVE
Protein Ur, POC: NEGATIVE
RBC UA: NEGATIVE
Spec Grav, UA: 1.02
UROBILINOGEN UA: 0.2
pH, UA: 6

## 2015-08-17 LAB — COMPREHENSIVE METABOLIC PANEL
ALT: 57 U/L — AB (ref 6–29)
AST: 45 U/L — ABNORMAL HIGH (ref 10–35)
Albumin: 5.1 g/dL (ref 3.6–5.1)
Alkaline Phosphatase: 94 U/L (ref 33–115)
BILIRUBIN TOTAL: 0.4 mg/dL (ref 0.2–1.2)
BUN: 11 mg/dL (ref 7–25)
CALCIUM: 9.4 mg/dL (ref 8.6–10.2)
CHLORIDE: 99 mmol/L (ref 98–110)
CO2: 28 mmol/L (ref 20–31)
CREATININE: 0.76 mg/dL (ref 0.50–1.10)
Glucose, Bld: 127 mg/dL — ABNORMAL HIGH (ref 65–99)
POTASSIUM: 4.9 mmol/L (ref 3.5–5.3)
Sodium: 139 mmol/L (ref 135–146)
Total Protein: 7.3 g/dL (ref 6.1–8.1)

## 2015-08-17 LAB — POC MICROSCOPIC URINALYSIS (UMFC): Mucus: ABSENT

## 2015-08-17 MED ORDER — AMPHETAMINE-DEXTROAMPHET ER 20 MG PO CP24: 20.0000 mg | ORAL_CAPSULE | Freq: Two times a day (BID) | ORAL | Status: DC

## 2015-08-17 MED ORDER — MELOXICAM 15 MG PO TABS: 15.0000 mg | ORAL_TABLET | Freq: Every day | ORAL | Status: DC | PRN

## 2015-08-17 MED ORDER — SERTRALINE HCL 100 MG PO TABS: 200.0000 mg | ORAL_TABLET | Freq: Every day | ORAL | Status: DC

## 2015-08-17 MED ORDER — ALPRAZOLAM 0.5 MG PO TABS: 0.5000 mg | ORAL_TABLET | Freq: Two times a day (BID) | ORAL | Status: DC | PRN

## 2015-08-17 NOTE — Progress Notes (Signed)
Subjective:    Patient ID: Jessica Knox, female    DOB: May 10, 1966, 49 y.o.   MRN: [id number]  HPI This is a 49 yo female who presents today for CPE.   Last CPE- last year Mammo- has not had in many years, knows she is overdue, doesn't like to have since her breast reduction surgery due to discomfort Pap- hysterectomy/ had pap 11/14   Flu- annual Eye- regular Dental- regular Exercise- none  She has had tremendous stressors over the last several months. She has not found a job and misses the stability of her work and having somewhere to go. Her fiance's daughter and granddaughter have been staying with them and it has been stressful. She has not seen her therapist in several months due to her job loss. Has had some interviews but no offers. Has not spent much time looking for a job due to other obligations.   She has had chronic UTIs and abdominal pain, s/p 2 exploratory lap surgeries. Had appointment with urology for earlier this week, but rescheduled due to other obligations. Is scheduled to see urology at the end of next month.   Continues to have intermittent abdominal pain, with alternating diarrhea and constipation, feels like her pain is directly related to her stress. Has been talking about seeing Dr. Loreta AveMann in follow up, but has not made an appointment.   Past Medical History  Diagnosis Date  . SVT (supraventricular tachycardia) (HCC)   . Hypertension   . GERD (gastroesophageal reflux disease)   . Thyroid disease     multinodular thyroid total thyroidectomy   Past Surgical History  Procedure Laterality Date  . Cholecystectomy    . Abdominal hysterectomy    . Thyroidectomy    . Right oophorectomy  12/06/2012  . Right oophorectomy Right   . Robotic assisted diagnostic laparoscopy  01/10/2015   Family History  Problem Relation Age of Onset  . Adopted: Yes  . Family history unknown: Yes   Social History  Substance Use Topics  . Smoking status: Current Every Day Smoker  -- 1.00 packs/day for 30 years    Types: Cigarettes  . Smokeless tobacco: Never Used  . Alcohol Use: 0.0 oz/week    0 Standard drinks or equivalent per week     Comment: all mixed drinks    Review of Systems  Constitutional: Positive for fatigue.  HENT: Negative.   Eyes: Negative.   Respiratory: Negative.   Cardiovascular: Negative.   Gastrointestinal: Negative.   Endocrine: Negative.   Genitourinary: Positive for dysuria and frequency.  Musculoskeletal: Positive for myalgias and back pain.  Allergic/Immunologic: Positive for environmental allergies.  Neurological: Negative.   Psychiatric/Behavioral: Positive for sleep disturbance and dysphoric mood. The patient is nervous/anxious.       Objective:   Physical Exam  Constitutional: She is oriented to person, place, and time. She appears well-developed and well-nourished. No distress.  HENT:  Head: Normocephalic and atraumatic.  Right Ear: Tympanic membrane, external ear and ear canal normal.  Left Ear: Tympanic membrane, external ear and ear canal normal.  Nose: Nose normal.  Mouth/Throat: Oropharynx is clear and moist. No oropharyngeal exudate.  Cardiovascular: Regular rhythm and normal heart sounds.   Pulmonary/Chest: Effort normal and breath sounds normal.  Abdominal: Soft. Bowel sounds are normal. There is tenderness (diffuse.).  Musculoskeletal: Normal range of motion. She exhibits no edema or tenderness.  Neurological: She is alert and oriented to person, place, and time. She has normal reflexes. No cranial nerve deficit.  Skin: Skin is warm and dry. No rash noted. She is not diaphoretic. No erythema. No pallor.  Psychiatric: She exhibits a depressed mood.  Vitals reviewed.   BP 156/95 mmHg  Pulse 106  Temp(Src) 97.7 F (36.5 C) (Oral)  Resp 16  Ht 4' 11.5" (1.511 m)  Wt 148 lb 6.4 oz (67.314 kg)  BMI 29.48 kg/m2' Wt Readings from Last 3 Encounters:  08/17/15 148 lb 6.4 oz (67.314 kg)  03/01/15 142 lb 12.8 oz  (64.774 kg)  02/01/15 145 lb (65.772 kg)   Depression screen [facility] 2/9 08/17/2015 11/02/2014  Decreased Interest 3 3  Down, Depressed, Hopeless 1 3  PHQ - 2 Score 4 6  Altered sleeping 1 3  Tired, decreased energy 3 3  Change in appetite 0 3  Feeling bad or failure about yourself  1 2  Trouble concentrating 0 0  Moving slowly or fidgety/restless 0 3  Suicidal thoughts 0 0  PHQ-9 Score 9 20      Assessment & Plan:  1. Annual physical exam - Discussed healthy habits and stress management, encouraged her to make choices to improve her health and her mood by exercising regularly, decreasing tobacco and alcohol intake, participating in her faith community and not allowing others to take her power. Encouraged her to not see herself as a victim, but to make intentional decisions.   2. ADD (attention deficit disorder) - Continued discussion concerning using stimulant medication and effect on anxiety.  - amphetamine-dextroamphetamine (ADDERALL XR) 20 MG 24 hr capsule; Take 1 capsule (20 mg total) by mouth 2 (two) times daily.  Dispense: 60 capsule; Refill: 0 - amphetamine-dextroamphetamine (ADDERALL XR) 20 MG 24 hr capsule; Take 1 capsule (20 mg total) by mouth 2 (two) times daily.  Dispense: 60 capsule; Refill: 0 - amphetamine-dextroamphetamine (ADDERALL XR) 20 MG 24 hr capsule; Take 1 capsule (20 mg total) by mouth 2 (two) times daily.  Dispense: 60 capsule; Refill: 0  3. Dysuria - POCT urinalysis dipstick - POCT Microscopic Urinalysis ([facility]) - Urine culture  4. Hyperlipemia - FLP  5. 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 Vitamin D deficiency - Vit D level  7. Chronic fatigue - Vit D  25 hydroxy (rtn osteoporosis monitoring)  8. Elevated LFTs - Comprehensive metabolic panel  9. Elevated glucose - Hemoglobin A1c  10. Lower abdominal pain - meloxicam (MOBIC) 15 MG tablet; Take 1 tablet (15 mg total) by mouth daily as needed for pain.  Dispense: 30 tablet;  Refill: 1  11. Adjustment disorder with mixed anxiety and depressed mood - she is interested in trying 200 mg daily - sertraline (ZOLOFT) 100 MG tablet; Take 2 tablets (200 mg total) by mouth daily.  Dispense: 180 tablet; Refill: 0 - ALPRAZolam (XANAX) 0.5 MG tablet; Take 1 tablet (0.5 mg total) by mouth 2 (two) times daily as needed. for anxiety  Dispense: 30 tablet; Refill: 2  12. Need for prophylactic vaccination and inoculation against influenza - Flu Vaccine QUAD 36+ mos IM  - follow up 3 months  Olean Ree, FNP-BC  [facility], [facility]  08/17/2015 7:55 PM

## 2015-08-17 NOTE — Patient Instructions (Addendum)
Think about what you need to do to be healthy Exercise daily- walk for 20 minutes Have days where you don't drink alcohol Go to church Get regular sleep and eat regular meals Don't give away your power

## 2015-08-18 ENCOUNTER — Other Ambulatory Visit: Payer: Self-pay | Admitting: Family Medicine

## 2015-08-18 DIAGNOSIS — E890 Postprocedural hypothyroidism: Secondary | ICD-10-CM

## 2015-08-18 DIAGNOSIS — R7309 Other abnormal glucose: Secondary | ICD-10-CM

## 2015-08-18 LAB — VITAMIN D 25 HYDROXY (VIT D DEFICIENCY, FRACTURES): Vit D, 25-Hydroxy: 32 ng/mL (ref 30–100)

## 2015-08-18 LAB — HEMOGLOBIN A1C
HEMOGLOBIN A1C: 6.6 % — AB (ref <5.7)
MEAN PLASMA GLUCOSE: 143 mg/dL — AB (ref <117)

## 2015-08-18 LAB — TSH: TSH: 15.872 u[IU]/mL — ABNORMAL HIGH (ref 0.350–4.500)

## 2015-08-18 LAB — URINE CULTURE
Colony Count: NO GROWTH
Organism ID, Bacteria: NO GROWTH

## 2015-08-18 MED ORDER — LEVOTHYROXINE SODIUM 150 MCG PO TABS: 150.0000 ug | ORAL_TABLET | Freq: Every day | ORAL | Status: DC

## 2015-09-08 ENCOUNTER — Other Ambulatory Visit: Payer: Self-pay | Admitting: Physician Assistant

## 2015-09-30 ENCOUNTER — Other Ambulatory Visit: Payer: Self-pay | Admitting: Family Medicine

## 2015-09-30 NOTE — Telephone Encounter (Signed)
Is she continuing her Estrace didn't see any mention in her October visit.

## 2015-10-06 ENCOUNTER — Encounter: Payer: Self-pay | Admitting: Family Medicine

## 2015-10-07 ENCOUNTER — Other Ambulatory Visit: Payer: Self-pay | Admitting: Family Medicine

## 2015-10-07 DIAGNOSIS — I10 Essential (primary) hypertension: Secondary | ICD-10-CM

## 2015-10-07 MED ORDER — SPIRONOLACTONE 50 MG PO TABS: 50.0000 mg | ORAL_TABLET | Freq: Every day | ORAL | Status: DC

## 2015-10-20 ENCOUNTER — Other Ambulatory Visit: Payer: Self-pay | Admitting: Family Medicine

## 2015-10-20 DIAGNOSIS — Z1231 Encounter for screening mammogram for malignant neoplasm of breast: Secondary | ICD-10-CM

## 2015-10-24 ENCOUNTER — Ambulatory Visit (HOSPITAL_BASED_OUTPATIENT_CLINIC_OR_DEPARTMENT_OTHER)
Admission: RE | Admit: 2015-10-24 | Discharge: 2015-10-24 | Disposition: A | Discharge status: Home / Self Care | Payer: BLUE CROSS/BLUE SHIELD | Source: Ambulatory Visit | Attending: Family Medicine | Admitting: Family Medicine

## 2015-10-24 DIAGNOSIS — Z1231 Encounter for screening mammogram for malignant neoplasm of breast: Secondary | ICD-10-CM | POA: Diagnosis not present

## 2015-10-28 ENCOUNTER — Encounter: Payer: Self-pay | Admitting: Family Medicine

## 2015-11-16 ENCOUNTER — Ambulatory Visit: Payer: BLUE CROSS/BLUE SHIELD | Admitting: Family Medicine

## 2015-11-16 ENCOUNTER — Other Ambulatory Visit: Payer: Self-pay | Admitting: Family Medicine

## 2015-12-02 ENCOUNTER — Other Ambulatory Visit: Payer: Self-pay | Admitting: Family Medicine

## 2015-12-19 ENCOUNTER — Other Ambulatory Visit: Payer: Self-pay | Admitting: Family Medicine

## 2016-01-10 ENCOUNTER — Telehealth: Payer: Self-pay | Admitting: Family Medicine

## 2016-01-10 NOTE — Telephone Encounter (Signed)
Pt req. Refill for amphetamine-dextroamphetamine (ADDERALL XR) 20 MG 24 hr capsule [[id number]][[id number]]   Upcoming OV 01/17/16 @ 1330

## 2016-01-11 ENCOUNTER — Other Ambulatory Visit: Payer: Self-pay | Admitting: Family Medicine

## 2016-01-11 DIAGNOSIS — F988 Other specified behavioral and emotional disorders with onset usually occurring in childhood and adolescence: Secondary | ICD-10-CM

## 2016-01-11 MED ORDER — AMPHETAMINE-DEXTROAMPHET ER 20 MG PO CP24: 20.0000 mg | ORAL_CAPSULE | Freq: Two times a day (BID) | ORAL | Status: DC

## 2016-01-11 NOTE — Telephone Encounter (Signed)
Please call patient and let her know that her refill is ready at 104.

## 2016-01-17 ENCOUNTER — Ambulatory Visit: Payer: BLUE CROSS/BLUE SHIELD | Admitting: Family Medicine

## 2016-01-25 ENCOUNTER — Ambulatory Visit (INDEPENDENT_AMBULATORY_CARE_PROVIDER_SITE_OTHER): Payer: BLUE CROSS/BLUE SHIELD | Admitting: Family Medicine

## 2016-01-25 ENCOUNTER — Other Ambulatory Visit: Payer: Self-pay | Admitting: Family Medicine

## 2016-01-25 VITALS — BP 135/91 | HR 102 | Temp 98.8°F | Resp 16 | Ht 60.0 in | Wt 157.2 lb

## 2016-01-25 DIAGNOSIS — E034 Atrophy of thyroid (acquired): Secondary | ICD-10-CM | POA: Diagnosis not present

## 2016-01-25 DIAGNOSIS — N390 Urinary tract infection, site not specified: Secondary | ICD-10-CM

## 2016-01-25 DIAGNOSIS — E038 Other specified hypothyroidism: Secondary | ICD-10-CM

## 2016-01-25 DIAGNOSIS — F4323 Adjustment disorder with mixed anxiety and depressed mood: Secondary | ICD-10-CM

## 2016-01-25 DIAGNOSIS — R7989 Other specified abnormal findings of blood chemistry: Secondary | ICD-10-CM

## 2016-01-25 DIAGNOSIS — E669 Obesity, unspecified: Secondary | ICD-10-CM | POA: Diagnosis not present

## 2016-01-25 DIAGNOSIS — I10 Essential (primary) hypertension: Secondary | ICD-10-CM | POA: Diagnosis not present

## 2016-01-25 DIAGNOSIS — R945 Abnormal results of liver function studies: Principal | ICD-10-CM

## 2016-01-25 LAB — COMPREHENSIVE METABOLIC PANEL
ALK PHOS: 86 U/L (ref 33–115)
ALT: 60 U/L — AB (ref 6–29)
AST: 47 U/L — AB (ref 10–35)
Albumin: 5 g/dL (ref 3.6–5.1)
BILIRUBIN TOTAL: 0.6 mg/dL (ref 0.2–1.2)
BUN: 13 mg/dL (ref 7–25)
CALCIUM: 8.9 mg/dL (ref 8.6–10.2)
CO2: 23 mmol/L (ref 20–31)
Chloride: 96 mmol/L — ABNORMAL LOW (ref 98–110)
Creat: 0.8 mg/dL (ref 0.50–1.10)
GLUCOSE: 129 mg/dL — AB (ref 65–99)
Potassium: 4 mmol/L (ref 3.5–5.3)
Sodium: 136 mmol/L (ref 135–146)
TOTAL PROTEIN: 7.5 g/dL (ref 6.1–8.1)

## 2016-01-25 LAB — POC MICROSCOPIC URINALYSIS (UMFC): Mucus: ABSENT

## 2016-01-25 LAB — POCT URINALYSIS DIP (MANUAL ENTRY)
Glucose, UA: NEGATIVE
Leukocytes, UA: NEGATIVE
NITRITE UA: NEGATIVE
PH UA: 6
Protein Ur, POC: 30 — AB
RBC UA: NEGATIVE
Spec Grav, UA: 1.025
Urobilinogen, UA: 0.2

## 2016-01-25 MED ORDER — ALPRAZOLAM 0.5 MG PO TABS: 0.5000 mg | ORAL_TABLET | Freq: Two times a day (BID) | ORAL | Status: DC | PRN

## 2016-01-25 NOTE — Patient Instructions (Signed)
     IF you received an x-ray today, you will receive an invoice from [facility]. Please contact [facility] at [phone] with questions or concerns regarding your invoice.   IF you received labwork today, you will receive an invoice from [facility]. Please contact [facility] at [phone] with questions or concerns regarding your invoice.   Our billing staff will not be able to assist you with questions regarding bills from these companies.  You will be contacted with the lab results as soon as they are available. The fastest way to get your results is to activate your My Chart account. Instructions are located on the last page of this paperwork. If you have not heard from us regarding the results in 2 weeks, please contact this office.      

## 2016-01-25 NOTE — Progress Notes (Signed)
Subjective:    Patient ID: Jessica Knox, female    DOB: 01-10-1966, 50 y.o.   MRN: [id number]  HPI This is a 50 yo female who presents today for follow up of elevated blood pressure, hypothyroidism, ADD. 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 She has been out of work for several months. 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 She lives with her fiance and has 3 children from previous marriage. Her twins are seniors in high school and her oldest son is 56. She is smoking 1 ppd. Has decreased etoh consumption from a bottle of wine every day to 1-2 beers daily. She has been struggling with increased stress and depression related to her lack of employment and ongoing legal concerns with her ex-husband. Taking Zoloft 100 mg and Xanax for sleep. She sees Karmen Bongo for counseling, but has been unable to afford sessions in recent months. Long standing ADD diagnosis and takes Adderall occasionally to improve focus. Sleep is poor, not on good schedule. Stays up late and sleeps during the day. Fiance is supportive.   She has a history of frequent UTIs and has been seeing urology. She is taking daily nitrofurantoin with good results.   Past Medical History  Diagnosis Date  . SVT (supraventricular tachycardia) (HCC)   . Hypertension   . GERD (gastroesophageal reflux disease)   . Thyroid disease     multinodular thyroid total thyroidectomy   Past Surgical History  Procedure Laterality Date  . Cholecystectomy    . Abdominal hysterectomy    . Thyroidectomy    . Right oophorectomy  12/06/2012  . Right oophorectomy Right   . Robotic assisted diagnostic laparoscopy  01/10/2015   Family History  Problem Relation Age of Onset  . Adopted: Yes  . Family history unknown: Yes   Social History  Substance Use Topics  . Smoking status: Current Every Day Smoker -- 1.00 packs/day for 30 years    Types: Cigarettes  . Smokeless tobacco: Never Used  . Alcohol Use: 0.0 oz/week     0 Standard drinks or equivalent per week     Comment: all mixed drinks    Review of Systems  Constitutional: Positive for fatigue and unexpected weight change (gain). Negative for fever.  Respiratory: Negative for cough, shortness of breath and wheezing.   Cardiovascular: Negative for chest pain and palpitations.  Genitourinary: Negative for dysuria, frequency and hematuria.  Psychiatric/Behavioral: Positive for dysphoric mood. The patient is nervous/anxious.       Objective:   Physical Exam  Vitals reviewed. Physical Exam  Constitutional: Oriented to person, place, and time. She appears well-developed and well-nourished.  HENT:  Head: Normocephalic and atraumatic.  Eyes: Conjunctivae are normal.  Neck: Normal range of motion. Neck supple.  Cardiovascular: Normal rate, regular rhythm and normal heart sounds.   Pulmonary/Chest: Effort normal and breath sounds normal.  Musculoskeletal: Normal range of motion.  Neurological: Alert and oriented to person, place, and time.  Skin: Skin is warm and dry.  Psychiatric: Normal mood and affect. Behavior is normal. Judgment and thought content normal.  Vitals reviewed.  BP 157/102 mmHg  Pulse 102  Temp(Src) 98.8 F (37.1 C) (Oral)  Resp 16  Ht 5' (1.524 m)  Wt 157 lb 3.2 oz (71.305 kg)  BMI 30.70 kg/m2  SpO2 97% Wt Readings from Last 3 Encounters:  01/25/16 157 lb 3.2 oz (71.305 kg)  08/17/15 148 lb 6.4 oz (67.314 kg)  03/01/15 142 lb 12.8 oz (64.774 kg)  Recheck 135/91     Assessment & Plan:  1. Elevated LFTs - Comprehensive metabolic panel  2. Chronic UTI - POCT urinalysis dipstick - POCT Microscopic Urinalysis ([facility])  3. Adjustment disorder with mixed anxiety and depressed mood - Discussed stress management, importance of exercise, regular sleep, self care - ALPRAZolam (XANAX) 0.5 MG tablet; Take 1 tablet (0.5 mg total) by mouth 2 (two) times daily as needed. for anxiety  Dispense: 30 tablet; Refill: 2  4.  Hypothyroidism due to acquired atrophy of thyroid - TSH  5. Essential hypertension - continue current meds, encouraged smoking cessation, weigh tloss  6. Obesity (BMI 30-39.9) - encouraged elimination of soda/sweet tea, white bread/rice/potatoes/pasta, exercise 4-6x/ weekly. Increase vegetables, fruits, lean protein, water intake.  - follow up 3 months Olean Reeeborah Gessner, FNP-BC  [facility], [facility]  01/28/2016 8:06 AM

## 2016-01-26 LAB — HEMOGLOBIN A1C
Hgb A1c MFr Bld: 7 % — ABNORMAL HIGH (ref <5.7)
MEAN PLASMA GLUCOSE: 154 mg/dL — AB (ref <117)

## 2016-01-26 LAB — TSH: TSH: 33.58 m[IU]/L — AB

## 2016-01-27 ENCOUNTER — Encounter: Payer: Self-pay | Admitting: Family Medicine

## 2016-01-30 ENCOUNTER — Other Ambulatory Visit: Payer: Self-pay | Admitting: Family Medicine

## 2016-01-30 DIAGNOSIS — E034 Atrophy of thyroid (acquired): Secondary | ICD-10-CM

## 2016-01-30 DIAGNOSIS — E1169 Type 2 diabetes mellitus with other specified complication: Secondary | ICD-10-CM

## 2016-01-30 DIAGNOSIS — E669 Obesity, unspecified: Secondary | ICD-10-CM

## 2016-01-30 MED ORDER — METFORMIN HCL 500 MG PO TABS: 500.0000 mg | ORAL_TABLET | Freq: Two times a day (BID) | ORAL | Status: DC

## 2016-01-30 MED ORDER — LEVOTHYROXINE SODIUM 175 MCG PO TABS: 175.0000 ug | ORAL_TABLET | Freq: Every day | ORAL | Status: DC

## 2016-02-21 ENCOUNTER — Encounter: Payer: Self-pay | Admitting: Family Medicine

## 2016-03-13 ENCOUNTER — Other Ambulatory Visit: Payer: Self-pay

## 2016-03-13 ENCOUNTER — Encounter: Payer: Self-pay | Admitting: Family Medicine

## 2016-03-13 DIAGNOSIS — E669 Obesity, unspecified: Secondary | ICD-10-CM

## 2016-03-13 DIAGNOSIS — I10 Essential (primary) hypertension: Secondary | ICD-10-CM

## 2016-03-13 DIAGNOSIS — E1169 Type 2 diabetes mellitus with other specified complication: Secondary | ICD-10-CM

## 2016-03-13 MED ORDER — NITROFURANTOIN MACROCRYSTAL 50 MG PO CAPS: 50.0000 mg | ORAL_CAPSULE | Freq: Every day | ORAL | Status: DC

## 2016-03-13 MED ORDER — METFORMIN HCL 500 MG PO TABS: 500.0000 mg | ORAL_TABLET | Freq: Two times a day (BID) | ORAL | Status: DC

## 2016-03-13 MED ORDER — SERTRALINE HCL 100 MG PO TABS: ORAL_TABLET | ORAL | Status: DC

## 2016-03-13 MED ORDER — DILTIAZEM HCL ER COATED BEADS 180 MG PO CP24: ORAL_CAPSULE | ORAL | Status: DC

## 2016-03-13 MED ORDER — SPIRONOLACTONE 50 MG PO TABS: 50.0000 mg | ORAL_TABLET | Freq: Every day | ORAL | Status: DC

## 2016-03-13 MED ORDER — ESOMEPRAZOLE MAGNESIUM 40 MG PO CPDR: DELAYED_RELEASE_CAPSULE | ORAL | Status: DC

## 2016-03-13 MED ORDER — ESTRADIOL 1 MG PO TABS: ORAL_TABLET | ORAL | Status: DC

## 2016-03-13 NOTE — Telephone Encounter (Signed)
Jessica Knox, I OKd the RFs that you had specifically addressed at March OV. [facility] also is requesting RFs of estradiol, nitrofurantoin and esomeprazole. I have pended these for 90 days. They also requested 90 days of alprazolam, but I denied that because pt should still have 1-2 RFs at local pharm and I didn't think you would want to Rx 90 days of it? It looks like we have not been the ones that have been Rxing the nitrofurantoin, but you discussed w/pt.

## 2016-03-14 ENCOUNTER — Telehealth: Payer: Self-pay

## 2016-03-14 NOTE — Telephone Encounter (Signed)
I called and spoke to patient. She reports having an elevated blood pressure reading several days ago that was 190s. She is not sure if her cuff was accurate, it was a wrist cuff. Her blood pressure today is 130/94. She will come in tomorrow for evaluation.

## 2016-03-14 NOTE — Telephone Encounter (Signed)
Pt is needing a referral to dr Jacinto Halimganji office

## 2016-03-14 NOTE — Telephone Encounter (Signed)
May be best to let Debbie handle this.  I don't see anything in the chart that would warrant a referral to Cards.

## 2016-03-14 NOTE — Telephone Encounter (Signed)
Pt has left 2 emails to [employer] Gr[employer]Debbie Gessner.

## 2016-03-15 ENCOUNTER — Ambulatory Visit (INDEPENDENT_AMBULATORY_CARE_PROVIDER_SITE_OTHER): Payer: BLUE CROSS/BLUE SHIELD | Admitting: Family Medicine

## 2016-03-15 ENCOUNTER — Ambulatory Visit (INDEPENDENT_AMBULATORY_CARE_PROVIDER_SITE_OTHER): Payer: BLUE CROSS/BLUE SHIELD

## 2016-03-15 VITALS — BP 132/82 | HR 97 | Temp 97.7°F | Resp 16 | Ht 59.0 in | Wt 153.0 lb

## 2016-03-15 DIAGNOSIS — R945 Abnormal results of liver function studies: Principal | ICD-10-CM

## 2016-03-15 DIAGNOSIS — I10 Essential (primary) hypertension: Secondary | ICD-10-CM | POA: Diagnosis not present

## 2016-03-15 DIAGNOSIS — R0602 Shortness of breath: Secondary | ICD-10-CM | POA: Diagnosis not present

## 2016-03-15 DIAGNOSIS — R9431 Abnormal electrocardiogram [ECG] [EKG]: Secondary | ICD-10-CM

## 2016-03-15 DIAGNOSIS — F4323 Adjustment disorder with mixed anxiety and depressed mood: Secondary | ICD-10-CM

## 2016-03-15 DIAGNOSIS — R7989 Other specified abnormal findings of blood chemistry: Secondary | ICD-10-CM

## 2016-03-15 DIAGNOSIS — R7309 Other abnormal glucose: Secondary | ICD-10-CM

## 2016-03-15 DIAGNOSIS — N390 Urinary tract infection, site not specified: Secondary | ICD-10-CM | POA: Diagnosis not present

## 2016-03-15 LAB — POCT URINALYSIS DIP (MANUAL ENTRY)
BILIRUBIN UA: NEGATIVE
BILIRUBIN UA: NEGATIVE
Blood, UA: NEGATIVE
GLUCOSE UA: NEGATIVE
LEUKOCYTES UA: NEGATIVE
NITRITE UA: NEGATIVE
Protein Ur, POC: NEGATIVE
Spec Grav, UA: 1.02
Urobilinogen, UA: 0.2
pH, UA: 7

## 2016-03-15 LAB — POCT CBC
Granulocyte percent: 71.2 %G (ref 37–80)
HEMATOCRIT: 44.6 % (ref 37.7–47.9)
Hemoglobin: 15.6 g/dL (ref 12.2–16.2)
LYMPH, POC: 2.1 (ref 0.6–3.4)
MCH, POC: 31.9 pg — AB (ref 27–31.2)
MCHC: 35 g/dL (ref 31.8–35.4)
MCV: 91.3 fL (ref 80–97)
MID (CBC): 0.8 (ref 0–0.9)
MPV: 6.9 fL (ref 0–99.8)
POC GRANULOCYTE: 7.3 — AB (ref 2–6.9)
POC LYMPH PERCENT: 20.6 %L (ref 10–50)
POC MID %: 8.2 % (ref 0–12)
Platelet Count, POC: 222 10*3/uL (ref 142–424)
RBC: 4.88 M/uL (ref 4.04–5.48)
RDW, POC: 12.7 %
WBC: 10.3 10*3/uL — AB (ref 4.6–10.2)

## 2016-03-15 LAB — COMPLETE METABOLIC PANEL WITH GFR
ALT: 78 U/L — ABNORMAL HIGH (ref 6–29)
AST: 50 U/L — ABNORMAL HIGH (ref 10–35)
Albumin: 4.6 g/dL (ref 3.6–5.1)
Alkaline Phosphatase: 91 U/L (ref 33–115)
BUN: 11 mg/dL (ref 7–25)
CALCIUM: 9.5 mg/dL (ref 8.6–10.2)
CHLORIDE: 98 mmol/L (ref 98–110)
CO2: 26 mmol/L (ref 20–31)
Creat: 0.66 mg/dL (ref 0.50–1.10)
GFR, Est African American: >89 mL/min (ref >=60)
Glucose, Bld: 132 mg/dL — ABNORMAL HIGH (ref 65–99)
POTASSIUM: 4.6 mmol/L (ref 3.5–5.3)
Sodium: 140 mmol/L (ref 135–146)
Total Bilirubin: 0.4 mg/dL (ref 0.2–1.2)
Total Protein: 6.9 g/dL (ref 6.1–8.1)

## 2016-03-15 LAB — POC MICROSCOPIC URINALYSIS (UMFC): MUCUS RE: ABSENT

## 2016-03-15 LAB — GLUCOSE, POCT (MANUAL RESULT ENTRY): POC GLUCOSE: 124 mg/dL — AB (ref 70–99)

## 2016-03-15 NOTE — Patient Instructions (Addendum)
You have an appointment with Dr. Jacinto HalimGanji on Wed., May 17 at 2 pm- please bring photo id, insurance card, co-pay and bottles of medication.   Please watch sodium intake- avoid packaged or prepared foods   IF you received an x-ray today, you will receive an [facility]. Please contact [facility] at [phone] with questions or concerns regarding your invoice.   IF you received labwork today, you will receive an invoice from [facility]. Please contact [facility] at [phone] with questions or concerns regarding your invoice.   Our billing staff will not be able to assist you with questions regarding bills from these companies.  You will be contacted with the lab results as soon as they are available. The fastest way to get your results is to activate your My Chart account. Instructions are located on the last page of this paperwork. If you have not heard from us regarding the results in 2 weeks, please contact this office.

## 2016-03-15 NOTE — Progress Notes (Signed)
Subjective:    Patient ID: Jessica Knox, female    DOB: 08-09-1966, 50 y.o.   MRN: [id number]  HPI This is a 50 yo female who presents today with 6 months of increasing SOB. Was having intermittent episodes now is having more episodes even at rest. No wheezing. No chest pain. Has chronic abdominal pain, no worse. Has up to 12 stools daily, no worse with metformin. Occasional cough. Smokes 1 ppd. Drinks 2 bottles of wine per week (down from 1 bottle a day). Stopped taking Adderall when her BP was elevated.   She was seen 01/25/16 and was diagnosed with DM with HgbA1C 7.0, she had been borderline and the decision was made to start metformin. She reports that she has been watching carbs. She has lost 4 pounds. Her TSH at that time was 33 and her synthroid was increased.   She reports that she is at peace with her decision to live away from her children and with her divorce proceedings and with the difficulties with her oldest son. Her youngest sons (twins) are graduating from HS next month.   Past Medical History  Diagnosis Date  . SVT (supraventricular tachycardia) (HCC)   . Hypertension   . GERD (gastroesophageal reflux disease)   . Thyroid disease     multinodular thyroid total thyroidectomy   Past Surgical History  Procedure Laterality Date  . Cholecystectomy    . Abdominal hysterectomy    . Thyroidectomy    . Right oophorectomy  12/06/2012  . Right oophorectomy Right   . Robotic assisted diagnostic laparoscopy  01/10/2015   Family History  Problem Relation Age of Onset  . Adopted: Yes  . Family history unknown: Yes   Social History  Substance Use Topics  . Smoking status: Current Every Day Smoker -- 1.00 packs/day for 30 years    Types: Cigarettes  . Smokeless tobacco: Never Used  . Alcohol Use: 0.0 oz/week    0 Standard drinks or equivalent per week     Comment: all mixed drinks      Review of Systems     Objective:   Physical Exam    BP 132/82 mmHg  Pulse 97   Temp(Src) 97.7 F (36.5 C) (Oral)  Resp 16  Ht  (1.499 m)  Wt 153 lb (69.4 kg)  BMI 30.89 kg/m2  SpO2 97% Wt Readings from Last 3 Encounters:  03/15/16 153 lb (69.4 kg)  01/25/16 157 lb 3.2 oz (71.305 kg)  08/17/15 148 lb 6.4 oz (67.314 kg)   EKG- SR with short PR syndrome, frequent ectopic ventricular beats Results for orders placed or performed in visit on 03/15/16  POCT glucose (manual entry)  Result Value Ref Range   POC Glucose 124 (A) 70 - 99 mg/dl  POCT CBC  Result Value Ref Range   WBC 10.3 (A) 4.6 - 10.2 K/uL   Lymph, poc 2.1 0.6 - 3.4   POC LYMPH PERCENT 20.6 10 - 50 %L   MID (cbc) 0.8 0 - 0.9   POC MID % 8.2 0 - 12 %M   POC Granulocyte 7.3 (A) 2 - 6.9   Granulocyte percent 71.2 37 - 80 %G   RBC 4.88 4.04 - 5.48 M/uL   Hemoglobin 15.6 12.2 - 16.2 g/dL   HCT, POC 78.2 95.6 - 47.9 %   MCV 91.3 80 - 97 fL   MCH, POC 31.9 (A) 27 - 31.2 pg   MCHC 35.0 31.8 - 35.4 g/dL   RDW,  POC 12.7 %   Platelet Count, POC 222 142 - 424 K/uL   MPV 6.9 0 - 99.8 fL  POCT urinalysis dipstick  Result Value Ref Range   Color, UA yellow yellow   Clarity, UA clear clear   Glucose, UA negative negative   Bilirubin, UA negative negative   Ketones, POC UA negative negative   Spec Grav, UA 1.020    Blood, UA negative negative   pH, UA 7.0    Protein Ur, POC negative negative   Urobilinogen, UA 0.2    Nitrite, UA Negative Negative   Leukocytes, UA Negative Negative  POCT Microscopic Urinalysis ([facility])  Result Value Ref Range   WBC,UR,HPF,POC None None WBC/hpf   RBC,UR,HPF,POC None None RBC/hpf   Bacteria None None, Too numerous to count   Mucus Absent Absent   Epithelial Cells, UR Per Microscopy Few (A) None, Too numerous to count cells/hpf   Dg Chest 2 View  03/15/2016  CLINICAL DATA:  Shortness of breath, history hypertension, smoker EXAM: CHEST  2 VIEW COMPARISON:  01/26/2015 FINDINGS: Upper normal heart size. Mediastinal contours and pulmonary vascularity normal. Lungs  clear. No pleural effusion or pneumothorax. Bones unremarkable. IMPRESSION: No acute abnormalities. Electronically Signed   By: Ulyses SouthwardMark  Boles M.D.   On: 03/15/2016 16:42   Office Spirometry Results: Peak Flow: 200 L/MIN FEV1: 2.04 liters FVC: 2.51 liters FEV1/FVC: 81.3 % FVC  % Predicted: 84 liters FEV % Predicted: 85 liters FeF 25-75: 2.28 liters FeF 25-75 % Predicted: 89     Assessment & Plan:  Discussed with Dr. Katrinka BlazingSmith who reviewed EKG 1. Elevated LFTs - COMPLETE METABOLIC PANEL WITH GFR  2. Adjustment disorder with mixed anxiety and depressed mood - continue zoloft, xanaz  3. Essential hypertension - well controlled today  4. SOB (shortness of breath) - POCT CBC - EKG 12-Lead - PFT PULM FXN SPIROMETRY (94010) - DG Chest 2 View; Future - Brain natriuretic peptide - Ambulatory referral to Cardiology  5. Chronic UTI - POCT urinalysis dipstick - POCT Microscopic Urinalysis ([facility])  6. Elevated glucose - POCT glucose (manual entry)  7. Nonspecific abnormal electrocardiogram (ECG) (EKG) - Ambulatory referral to Cardiology   Olean Reeeborah Gessner, FNP-BC  [facility], [facility]  03/18/2016 9:15 PM

## 2016-03-16 LAB — BRAIN NATRIURETIC PEPTIDE: BRAIN NATRIURETIC PEPTIDE: 39.7 pg/mL (ref <100)

## 2016-03-17 ENCOUNTER — Encounter: Payer: Self-pay | Admitting: Family Medicine

## 2016-03-19 ENCOUNTER — Other Ambulatory Visit: Payer: Self-pay | Admitting: Family Medicine

## 2016-03-19 DIAGNOSIS — R7989 Other specified abnormal findings of blood chemistry: Secondary | ICD-10-CM

## 2016-03-19 DIAGNOSIS — R945 Abnormal results of liver function studies: Principal | ICD-10-CM

## 2016-04-23 ENCOUNTER — Other Ambulatory Visit: Payer: Self-pay

## 2016-04-23 DIAGNOSIS — E669 Obesity, unspecified: Secondary | ICD-10-CM

## 2016-04-23 DIAGNOSIS — E1169 Type 2 diabetes mellitus with other specified complication: Secondary | ICD-10-CM

## 2016-04-23 MED ORDER — METFORMIN HCL 500 MG PO TABS: 500.0000 mg | ORAL_TABLET | Freq: Two times a day (BID) | ORAL | Status: DC

## 2016-05-01 ENCOUNTER — Encounter: Payer: Self-pay | Admitting: Family Medicine

## 2016-05-14 ENCOUNTER — Encounter: Payer: Self-pay | Admitting: Family Medicine

## 2016-06-20 ENCOUNTER — Other Ambulatory Visit: Payer: Self-pay | Admitting: Family Medicine

## 2016-06-20 DIAGNOSIS — I10 Essential (primary) hypertension: Secondary | ICD-10-CM

## 2016-06-25 NOTE — Telephone Encounter (Signed)
No longer a patient of our practice.

## 2016-06-26 MED ORDER — ESOMEPRAZOLE MAGNESIUM 40 MG PO CPDR: DELAYED_RELEASE_CAPSULE | ORAL | 0 refills | Status: DC

## 2016-06-26 MED ORDER — SERTRALINE HCL 100 MG PO TABS: ORAL_TABLET | ORAL | 0 refills | Status: DC

## 2016-06-26 MED ORDER — SPIRONOLACTONE 50 MG PO TABS: 50.0000 mg | ORAL_TABLET | Freq: Every day | ORAL | 0 refills | Status: DC

## 2016-06-26 MED ORDER — DILTIAZEM HCL ER COATED BEADS 180 MG PO CP24: ORAL_CAPSULE | ORAL | 0 refills | Status: DC

## 2016-06-26 MED ORDER — NITROFURANTOIN MACROCRYSTAL 50 MG PO CAPS: 50.0000 mg | ORAL_CAPSULE | Freq: Every day | ORAL | 0 refills | Status: DC

## 2016-06-26 MED ORDER — ESTRADIOL 1 MG PO TABS: ORAL_TABLET | ORAL | 0 refills | Status: DC

## 2016-07-11 ENCOUNTER — Telehealth: Payer: Self-pay

## 2016-07-11 ENCOUNTER — Other Ambulatory Visit: Payer: Self-pay | Admitting: Family Medicine

## 2016-07-11 DIAGNOSIS — R110 Nausea: Secondary | ICD-10-CM

## 2016-07-11 NOTE — Telephone Encounter (Signed)
Let patient know her appointment is 08/21/16 at 130pm if she calls in

## 2016-07-19 ENCOUNTER — Other Ambulatory Visit: Payer: Self-pay

## 2016-07-19 DIAGNOSIS — R110 Nausea: Secondary | ICD-10-CM

## 2016-07-19 MED ORDER — ONDANSETRON 8 MG PO TBDP: 8.0000 mg | ORAL_TABLET | Freq: Three times a day (TID) | ORAL | 0 refills | Status: DC | PRN

## 2016-07-30 ENCOUNTER — Encounter: Payer: Self-pay | Admitting: Physician Assistant

## 2016-07-30 ENCOUNTER — Ambulatory Visit (INDEPENDENT_AMBULATORY_CARE_PROVIDER_SITE_OTHER): Payer: BLUE CROSS/BLUE SHIELD | Admitting: Physician Assistant

## 2016-07-30 VITALS — BP 110/68 | HR 97 | Temp 97.4°F | Resp 17 | Ht 59.0 in | Wt 161.0 lb

## 2016-07-30 DIAGNOSIS — E669 Obesity, unspecified: Secondary | ICD-10-CM

## 2016-07-30 DIAGNOSIS — Z0000 Encounter for general adult medical examination without abnormal findings: Secondary | ICD-10-CM | POA: Diagnosis not present

## 2016-07-30 DIAGNOSIS — F17200 Nicotine dependence, unspecified, uncomplicated: Secondary | ICD-10-CM

## 2016-07-30 DIAGNOSIS — E119 Type 2 diabetes mellitus without complications: Secondary | ICD-10-CM | POA: Diagnosis not present

## 2016-07-30 DIAGNOSIS — R945 Abnormal results of liver function studies: Secondary | ICD-10-CM

## 2016-07-30 DIAGNOSIS — Z23 Encounter for immunization: Secondary | ICD-10-CM

## 2016-07-30 DIAGNOSIS — Z860100 Personal history of colon polyps, unspecified: Secondary | ICD-10-CM

## 2016-07-30 DIAGNOSIS — R0602 Shortness of breath: Secondary | ICD-10-CM | POA: Diagnosis not present

## 2016-07-30 DIAGNOSIS — R7989 Other specified abnormal findings of blood chemistry: Secondary | ICD-10-CM | POA: Diagnosis not present

## 2016-07-30 DIAGNOSIS — Z114 Encounter for screening for human immunodeficiency virus [HIV]: Secondary | ICD-10-CM | POA: Diagnosis not present

## 2016-07-30 DIAGNOSIS — Z720 Tobacco use: Secondary | ICD-10-CM | POA: Diagnosis not present

## 2016-07-30 DIAGNOSIS — F4323 Adjustment disorder with mixed anxiety and depressed mood: Secondary | ICD-10-CM | POA: Diagnosis not present

## 2016-07-30 DIAGNOSIS — Z1231 Encounter for screening mammogram for malignant neoplasm of breast: Secondary | ICD-10-CM

## 2016-07-30 DIAGNOSIS — E890 Postprocedural hypothyroidism: Secondary | ICD-10-CM

## 2016-07-30 DIAGNOSIS — I10 Essential (primary) hypertension: Secondary | ICD-10-CM | POA: Diagnosis not present

## 2016-07-30 DIAGNOSIS — Z1389 Encounter for screening for other disorder: Secondary | ICD-10-CM

## 2016-07-30 DIAGNOSIS — Z139 Encounter for screening, unspecified: Secondary | ICD-10-CM

## 2016-07-30 DIAGNOSIS — E039 Hypothyroidism, unspecified: Secondary | ICD-10-CM | POA: Insufficient documentation

## 2016-07-30 DIAGNOSIS — E1169 Type 2 diabetes mellitus with other specified complication: Secondary | ICD-10-CM | POA: Insufficient documentation

## 2016-07-30 LAB — POCT URINALYSIS DIP (MANUAL ENTRY)
BILIRUBIN UA: NEGATIVE
Bilirubin, UA: NEGATIVE
Blood, UA: NEGATIVE
Leukocytes, UA: NEGATIVE
NITRITE UA: NEGATIVE
PROTEIN UA: NEGATIVE
SPEC GRAV UA: 1.015
UROBILINOGEN UA: 0.2
pH, UA: 6.5

## 2016-07-30 LAB — POC MICROSCOPIC URINALYSIS (UMFC): MUCUS RE: ABSENT

## 2016-07-30 MED ORDER — ALPRAZOLAM 0.5 MG PO TABS: 0.5000 mg | ORAL_TABLET | Freq: Two times a day (BID) | ORAL | 0 refills | Status: DC | PRN

## 2016-07-30 MED ORDER — IPRATROPIUM-ALBUTEROL 20-100 MCG/ACT IN AERS: 1.0000 | INHALATION_SPRAY | Freq: Four times a day (QID) | RESPIRATORY_TRACT | 1 refills | Status: DC

## 2016-07-30 MED ORDER — METFORMIN HCL 500 MG PO TABS: 500.0000 mg | ORAL_TABLET | Freq: Two times a day (BID) | ORAL | 3 refills | Status: DC

## 2016-07-30 NOTE — Progress Notes (Signed)
Subjective:    Patient ID: Jessica Knox, female    DOB: [birth year], 50 y.o.   MRN: [id number] Chief Complaint  Patient presents with  . Annual Exam  . Shortness of Breath    PT states health is "worsening"    HPI Patient is a 50 yo female who presents for her annual physical exam. She reports that her health is in a continuous state of decline. She verbalized complaints about different stressors in her life as well as her dissatisfaction with her self image. She reports wanting to exercise and eat better but has no motivation or energy to do so. Patient inquired about different therapies to bring her energy levels up.  Past Medical History:  Diagnosis Date  . GERD (gastroesophageal reflux disease)   . Hypertension   . SVT (supraventricular tachycardia) (HCC)   . Thyroid disease    multinodular thyroid total thyroidectomy   Family History  Problem Relation Age of Onset  . Adopted: Yes  . Family history unknown: Yes   Social History   Social History  . Marital status: Legally Separated    Spouse name: N/A  . Number of children: N/A  . Years of education: N/A   Occupational History  . Not on file.   Social History Main Topics  . Smoking status: Current Every Day Smoker    Packs/day: 1.00    Years: 30.00    Types: Cigarettes  . Smokeless tobacco: Never Used  . Alcohol use 0.0 oz/week     Comment: all mixed drinks  . Drug use: No  . Sexual activity: Yes   Other Topics Concern  . Not on file   Social History Narrative   Exercise per patient cleaning her house and working in the yard    Current Outpatient Prescriptions on File Prior to Visit  Medication Sig Dispense Refill  . ALPRAZolam (XANAX) 0.5 MG tablet Take 1 tablet (0.5 mg total) by mouth 2 (two) times daily as needed. for anxiety 30 tablet 2  . cetirizine (ZYRTEC) 10 MG tablet Take 10 mg by mouth daily.    Marland Kitchen diltiazem (CARTIA XT) 180 MG 24 hr capsule TAKE 1 CAPSULE(180 MG) BY MOUTH DAILY 90 capsule 0  .  esomeprazole (NEXIUM) 40 MG capsule TAKE ONE CAPSULE BY MOUTH DAILY 90 capsule 0  . estradiol (ESTRACE) 1 MG tablet TAKE 1 TABLET(1 MG) BY MOUTH DAILY 90 tablet 0  . levothyroxine (SYNTHROID, LEVOTHROID) 175 MCG tablet Take 1 tablet (175 mcg total) by mouth daily before breakfast. 90 tablet 1  . metFORMIN (GLUCOPHAGE) 500 MG tablet Take 1 tablet (500 mg total) by mouth 2 (two) times daily with a meal. 180 tablet 0  . nitrofurantoin (MACRODANTIN) 50 MG capsule Take 1 capsule (50 mg total) by mouth at bedtime. 90 capsule 0  . ondansetron (ZOFRAN-ODT) 8 MG disintegrating tablet Take 1 tablet (8 mg total) by mouth every 8 (eight) hours as needed for nausea. 30 tablet 0  . spironolactone (ALDACTONE) 50 MG tablet Take 1 tablet (50 mg total) by mouth daily. 90 tablet 0   No current facility-administered medications on file prior to visit.     Review of Systems  Constitutional: Positive for activity change, chills, diaphoresis, fatigue and unexpected weight change. Negative for appetite change and fever.  HENT: Negative for congestion, ear discharge, ear pain, rhinorrhea, sinus pressure and sore throat.   Eyes: Positive for visual disturbance. Negative for photophobia.  Respiratory: Positive for chest tightness, shortness of breath and wheezing.  Cardiovascular: Positive for leg swelling. Negative for chest pain and palpitations.  Gastrointestinal: Positive for abdominal pain and diarrhea. Negative for nausea and vomiting.  Genitourinary: Positive for dysuria, frequency and urgency.  Musculoskeletal: Positive for back pain (Reports ruptured disk). Negative for arthralgias and myalgias.  Neurological: Positive for weakness. Negative for dizziness, numbness and headaches.  Psychiatric/Behavioral: Positive for dysphoric mood. Negative for agitation and behavioral problems.       Objective:   Physical Exam  Constitutional: She appears well-developed and well-nourished. No distress.  HENT:  Head:  Normocephalic and atraumatic.  Right Ear: External ear normal.  Left Ear: External ear normal.  Nose: Nose normal.  Mouth/Throat: Oropharynx is clear and moist. No oropharyngeal exudate.  Eyes: Conjunctivae are normal. Pupils are equal, round, and reactive to light. Right eye exhibits no discharge. Left eye exhibits no discharge.  Neck: Neck supple. Thyromegaly: Nodular mass on left side.  Cardiovascular: Normal rate, regular rhythm, normal heart sounds and intact distal pulses.  Exam reveals no gallop and no friction rub.   No murmur heard. Pulmonary/Chest: Effort normal and breath sounds normal. No respiratory distress. She has no wheezes. She exhibits no tenderness.  Abdominal: Bowel sounds are normal. She exhibits distension (Epigastric). She exhibits no mass. There is tenderness (Epigastric). There is no rebound and no guarding.  Lymphadenopathy:    She has no cervical adenopathy.  Neurological: She is alert. She displays normal reflexes.  Skin: Skin is warm and dry. No rash noted. She is not diaphoretic.  Psychiatric: Her behavior is normal.  Distressed about current state of health    BP 110/68 (BP Location: Left Arm, Patient Position: Sitting, Cuff Size: Large)   Pulse 97   Temp 97.4 F (36.3 C) (Oral)   Resp 17   Ht 4\' 11"  (1.499 m)   Wt 161 lb (73 kg)   SpO2 97%   BMI 32.52 kg/m         Assessment & Plan:  1. Annual physical exam  2. SOB (shortness of breath) Most likely due to extensive smoking hx, question she might have undiagnosed COPD. Reports relief of symptoms with combivent. Will refill - Ipratropium-Albuterol (COMBIVENT) 20-100 MCG/ACT AERS respimat; Inhale 1 puff into the lungs every 6 (six) hours.  Dispense: 4 g; Refill: 1  3. Adjustment disorder with mixed anxiety and depressed mood Most likely due to family stressors as well as recent health decline. Patient reports relief of anxiety symptoms with alprazolam; will refill - ALPRAZolam (XANAX) 0.5 MG  tablet; Take 1 tablet (0.5 mg total) by mouth 2 (two) times daily as needed. for anxiety  Dispense: 30 tablet; Refill: 0  4. Diabetes mellitus type 2 in obese (HCC) Moderately well controlled, will refill metformin - metFORMIN (GLUCOPHAGE) 500 MG tablet; Take 1 tablet (500 mg total) by mouth 2 (two) times daily with a meal.  Dispense: 180 tablet; Refill: 3 - Hemoglobin A1c - Lipid panel - Basic metabolic panel - HM Diabetes Eye Exam - HM Diabetes Foot Exam  5. Postoperative hypothyroidism Energy levels are lower than usual, will check TSH to see if effectiveness of medication - TSH  6. Smoker Reports still smoking a few cigarettes a day; reports welbutrin helping to quit  7. Essential hypertension  - CBC with Differential/Platelet - Basic metabolic panel  8. Elevated LFTs Upward trend of LFTs. Await labs for further guidence - Hepatic function panel - Acute Hep Panel & Hep B Surface Ab  9. Screening for HIV (human immunodeficiency virus)  -  HIV antibody  10. Need for influenza vaccination - Flu Vaccine QUAD 36+ mos IM  11. Need for pneumococcal vaccination  - Pneumococcal polysaccharide vaccine 23-valent greater than or equal to 2yo subcutaneous/IM  12. Need for Tdap vaccination  - Tdap vaccine greater than or equal to 7yo IM  13. History of colonic polyps Counseled on on follow-up with GI for colonoscopy.   14. Encounter for screening mammogram for breast cancer  - MM Digital Diagnostic Bilat; Future  15. Screening for blood or protein in urine  - POCT urinalysis dipstick - POCT Microscopic Urinalysis ([facility])

## 2016-07-30 NOTE — Progress Notes (Signed)
Patient ID: Jessica Knox, female    DOB: Jul 16, 1966, 50 y.o.   MRN: [id number]  PCP: Emi Belfast, FNP  Chief Complaint  Patient presents with  . Annual Exam  . Shortness of Breath    PT states health is "worsening"    Subjective:   HPI: Presents for Annual Wellness Visit.  Believes that her health is poor, and getting worse.  Not working. Lack of motivation to make healthy choices.  Cervical Cancer Screening: s/p hysterectomy. Pap 11/2012. Breast Cancer Screening: not yet Colorectal Cancer Screening: not yet a candidate Bone Density Testing: not yet a candidate HIV Screening: not yet Seasonal Influenza Vaccination: not yet Td/Tdap Vaccination: uncertain Pneumococcal Vaccination: not yet-needs Pneumovax 23 Zoster Vaccination: not yet a candidate    Patient Active Problem List   Diagnosis Date Noted  . Smoker 01/26/2015  . BMI 28.0-28.9,adult 01/26/2015  . Stress incontinence 01/26/2015  . Adnexal pain 12/17/2014  . Abdominal pain 04/24/2013  . Pancreatitis 04/24/2013    Past Medical History:  Diagnosis Date  . GERD (gastroesophageal reflux disease)   . Hypertension   . SVT (supraventricular tachycardia) (HCC)   . Thyroid disease    multinodular thyroid total thyroidectomy     Prior to Admission medications   Medication Sig Start Date End Date Taking? Authorizing Provider  ALPRAZolam Prudy Feeler) 0.5 MG tablet Take 1 tablet (0.5 mg total) by mouth 2 (two) times daily as needed. for anxiety 01/25/16  Yes Emi Belfast, FNP  cetirizine (ZYRTEC) 10 MG tablet Take 10 mg by mouth daily.   Yes Historical Provider, MD  diltiazem (CARTIA XT) 180 MG 24 hr capsule TAKE 1 CAPSULE(180 MG) BY MOUTH DAILY 06/26/16  Yes Emi Belfast, FNP  esomeprazole (NEXIUM) 40 MG capsule TAKE ONE CAPSULE BY MOUTH DAILY 06/26/16  Yes Emi Belfast, FNP  estradiol (ESTRACE) 1 MG tablet TAKE 1 TABLET(1 MG) BY MOUTH DAILY 06/26/16  Yes Emi Belfast, FNP  levothyroxine  (SYNTHROID, LEVOTHROID) 175 MCG tablet Take 1 tablet (175 mcg total) by mouth daily before breakfast. 01/30/16  Yes Emi Belfast, FNP  metFORMIN (GLUCOPHAGE) 500 MG tablet Take 1 tablet (500 mg total) by mouth 2 (two) times daily with a meal. 04/23/16  Yes Emi Belfast, FNP  metoprolol succinate (TOPROL-XL) 50 MG 24 hr tablet Take 50 mg by mouth daily. Take with or immediately following a meal.   Yes Historical Provider, MD  nitrofurantoin (MACRODANTIN) 50 MG capsule Take 1 capsule (50 mg total) by mouth at bedtime. 06/26/16  Yes Emi Belfast, FNP  nitrofurantoin, macrocrystal-monohydrate, (MACROBID) 100 MG capsule 1 po bid x5 days 02/16/15  Yes Historical Provider, MD  ondansetron (ZOFRAN-ODT) 8 MG disintegrating tablet Take 1 tablet (8 mg total) by mouth every 8 (eight) hours as needed for nausea. 07/19/16  Yes Ethelda Chick, MD  spironolactone (ALDACTONE) 50 MG tablet Take 1 tablet (50 mg total) by mouth daily. 06/26/16  Yes Emi Belfast, FNP  valsartan-hydrochlorothiazide (DIOVAN-HCT) 160-25 MG tablet Take 1 tablet by mouth daily.   Yes Historical Provider, MD  meloxicam (MOBIC) 15 MG tablet Take 15 mg by mouth daily.    Historical Provider, MD    Allergies  Allergen Reactions  . Dilaudid [Hydromorphone Hcl]   . Morphine And Related Itching  . Prednisone   . Ciprofloxacin Palpitations    Past Surgical History:  Procedure Laterality Date  . ABDOMINAL HYSTERECTOMY    . CHOLECYSTECTOMY    . RIGHT OOPHORECTOMY  12/06/2012  . RIGHT OOPHORECTOMY Right   . ROBOTIC ASSISTED DIAGNOSTIC LAPAROSCOPY  01/10/2015  . THYROIDECTOMY      Family History  Problem Relation Age of Onset  . Adopted: Yes  . Family history unknown: Yes    Social History   Social History  . Marital status: Legally Separated    Spouse name: N/A  . Number of children: N/A  . Years of education: N/A   Social History Main Topics  . Smoking status: Current Every Day Smoker    Packs/day: 1.00     Years: 30.00    Types: Cigarettes  . Smokeless tobacco: Never Used  . Alcohol use 0.0 oz/week     Comment: all mixed drinks  . Drug use: No  . Sexual activity: Yes   Other Topics Concern  . None   Social History Narrative   Exercise per patient cleaning her house and working in the yard       Review of Systems Constitutional: Positive for activity change, chills, diaphoresis, fatigue and unexpected weight change. Negative for appetite change and fever.  HENT: Negative for congestion, ear discharge, ear pain, rhinorrhea, sinus pressure and sore throat.   Eyes: Positive for visual disturbance. Negative for photophobia.  Respiratory: Positive for chest tightness, shortness of breath and wheezing.   Cardiovascular: Positive for leg swelling. Negative for chest pain and palpitations.  Gastrointestinal: Positive for abdominal pain and diarrhea. Negative for nausea and vomiting.  Genitourinary: Positive for dysuria, frequency and urgency.  Musculoskeletal: Positive for back pain (Reports ruptured disk). Negative for arthralgias and myalgias.  Neurological: Positive for weakness. Negative for dizziness, numbness and headaches.  Psychiatric/Behavioral: Positive for dysphoric mood. Negative for agitation and behavioral problems.      Objective:  Physical Exam  Constitutional: She is oriented to person, place, and time. Vital signs are normal. She appears well-developed and well-nourished. She is active and cooperative. No distress.  BP 110/68 (BP Location: Left Arm, Patient Position: Sitting, Cuff Size: Large)   Pulse 97   Temp 97.4 F (36.3 C) (Oral)   Resp 17   Ht 4\' 11"  (1.499 m)   Wt 161 lb (73 kg)   SpO2 97%   BMI 32.52 kg/m    HENT:  Head: Normocephalic and atraumatic.  Right Ear: Hearing, tympanic membrane, external ear and ear canal normal. No foreign bodies.  Left Ear: Hearing, tympanic membrane, external ear and ear canal normal. No foreign bodies.  Nose: Nose normal.   Mouth/Throat: Uvula is midline, oropharynx is clear and moist and mucous membranes are normal. No oral lesions. Normal dentition. No dental abscesses or uvula swelling. No oropharyngeal exudate.  Eyes: Conjunctivae, EOM and lids are normal. Pupils are equal, round, and reactive to light. Right eye exhibits no discharge. Left eye exhibits no discharge. No scleral icterus.  Fundoscopic exam:      The right eye shows no arteriolar narrowing, no AV nicking, no exudate, no hemorrhage and no papilledema. The right eye shows red reflex.       The left eye shows no arteriolar narrowing, no AV nicking, no exudate, no hemorrhage and no papilledema. The left eye shows red reflex.  Neck: Trachea normal, normal range of motion and full passive range of motion without pain. Neck supple. No spinous process tenderness and no muscular tenderness present. No thyroid mass and no thyromegaly present.  Cardiovascular: Normal rate, regular rhythm, normal heart sounds, intact distal pulses and normal pulses.   Pulmonary/Chest: Effort normal and breath sounds  normal. Right breast exhibits no inverted nipple, no mass, no nipple discharge, no skin change and no tenderness. Left breast exhibits no inverted nipple, no mass, no nipple discharge, no skin change and no tenderness. Breasts are symmetrical.  Musculoskeletal: She exhibits no edema or tenderness.       Cervical back: Normal.       Thoracic back: Normal.       Lumbar back: Normal.  Lymphadenopathy:       Head (right side): No tonsillar, no preauricular, no posterior auricular and no occipital adenopathy present.       Head (left side): No tonsillar, no preauricular, no posterior auricular and no occipital adenopathy present.    She has no cervical adenopathy.       Right: No supraclavicular adenopathy present.       Left: No supraclavicular adenopathy present.  Neurological: She is alert and oriented to person, place, and time. She has normal strength and normal  reflexes. No cranial nerve deficit. She exhibits normal muscle tone. Coordination and gait normal.  Skin: Skin is warm, dry and intact. No rash noted. She is not diaphoretic. No cyanosis or erythema. Nails show no clubbing.  Psychiatric: She has a normal mood and affect. Her speech is normal and behavior is normal. Judgment and thought content normal.           Assessment & Plan:  1. Annual physical exam Age appropriate anticipatory guidance provided.  2. SOB (shortness of breath) Encouraged smoking cessation. This likely represents COPD. Trial of Combivent. - Ipratropium-Albuterol (COMBIVENT) 20-100 MCG/ACT AERS respimat; Inhale 1 puff into the lungs every 6 (six) hours.  Dispense: 4 g; Refill: 1  3. Adjustment disorder with mixed anxiety and depressed mood Distressed about her state of health. Ready to make some changes, mostly out of fear. - ALPRAZolam (XANAX) 0.5 MG tablet; Take 1 tablet (0.5 mg total) by mouth 2 (two) times daily as needed. for anxiety  Dispense: 30 tablet; Refill: 0  4. Diabetes mellitus type 2 in obese [facility]) Await labs. Adjust regimen as indicated by results. Healthy lifestyle changes - metFORMIN (GLUCOPHAGE) 500 MG tablet; Take 1 tablet (500 mg total) by mouth 2 (two) times daily with a meal.  Dispense: 180 tablet; Refill: 3 - Hemoglobin A1c - Lipid panel - Basic metabolic panel - HM Diabetes Eye Exam - HM Diabetes Foot Exam  5. Postoperative hypothyroidism Await labs. Adjust regimen as indicated by results. - TSH  6. Smoker Reviewed smoking cessation options.   7. Essential hypertension Controlled. No changes. - CBC with Differential/Platelet - Basic metabolic panel  8. Elevated LFTs Update labs to assess stability and pursue diagnosis. - Hepatic function panel - Acute Hep Panel & Hep B Surface Ab  9. Screening for HIV (human immunodeficiency virus) - HIV antibody  10. Need for influenza vaccination - Flu Vaccine QUAD 36+ mos IM  11.  Need for pneumococcal vaccination Booster and Prevnar-13 at age 44 - Pneumococcal polysaccharide vaccine 23-valent greater than or equal to 2yo subcutaneous/IM  12. Need for Tdap vaccination - Tdap vaccine greater than or equal to 7yo IM  13. History of colonic polyps Needs to follow-up with GI per their recommendations  14. Encounter for screening mammogram for breast cancer - MM Digital Screening; Future  15. Screening for blood or protein in urine - POCT urinalysis dipstick - POCT Microscopic Urinalysis ([facility])    Fernande Bras, PA-C Physician Assistant-Certified [facility]

## 2016-07-30 NOTE — Patient Instructions (Addendum)
   IF you received an x-ray today, you will receive an invoice from [facility]. Please contact [facility] at [phone] with questions or concerns regarding your invoice.   IF you received labwork today, you will receive an invoice from [facility]. Please contact [facility] at [phone] with questions or concerns regarding your invoice.   Our billing staff will not be able to assist you with questions regarding bills from these companies.  You will be contacted with the lab results as soon as they are available. The fastest way to get your results is to activate your My Chart account. Instructions are located on the last page of this paperwork. If you have not heard from us regarding the results in 2 weeks, please contact this office.     Keeping You Healthy  Get These Tests 1. Blood Pressure- Have your blood pressure checked once a year by your health care provider.  Normal blood pressure is 120/80. 2. Weight- Have your body mass index (BMI) calculated to screen for obesity.  BMI is measure of body fat based on height and weight.  You can also calculate your own BMI at www.nhlbisupport.com/bmi/. 3. Cholesterol- Have your cholesterol checked every 5 years starting at age 20 then yearly starting at age 45. 4. Chlamydia, HIV, and other sexually transmitted diseases- Get screened every year until age 25, then within three months of each new sexual provider. 5. Pap Test - Every 1-5 years; discuss with your health care provider. 6. Mammogram- Every 1-2 years starting at age 40--50  Take these medicines  Calcium with Vitamin D-Your body needs 1200 mg of Calcium each day and 800-1000 IU of Vitamin D daily.  Your body can only absorb 500 mg of Calcium at a time so Calcium must be taken in 2 or 3 divided doses throughout the day.  Multivitamin with folic acid- Once daily if it is possible for you to become pregnant.  Get these  Immunizations  Gardasil-Series of three doses; prevents HPV related illness such as genital warts and cervical cancer.  Menactra-Single dose; prevents meningitis.  Tetanus shot- Every 10 years.  Flu shot-Every year.  Take these steps 1. Do not smoke-Your healthcare provider can help you quit.  For tips on how to quit go to www.smokefree.gov or call 1-800 QUITNOW. 2. Be physically active- Exercise 5 days a week for at least 30 minutes.  If you are not already physically active, start slow and gradually work up to 30 minutes of moderate physical activity.  Examples of moderate activity include walking briskly, dancing, swimming, bicycling, etc. 3. Breast Cancer- A self breast exam every month is important for early detection of breast cancer.  For more information and instruction on self breast exams, ask your healthcare provider or www.womenshealth.gov/faq/breast-self-exam.cfm. 4. Eat a healthy diet- Eat a variety of healthy foods such as fruits, vegetables, whole grains, low fat milk, low fat cheeses, yogurt, lean meats, poultry and fish, beans, nuts, tofu, etc.  For more information go to www. Thenutritionsource.org 5. Drink alcohol in moderation- Limit alcohol intake to one drink or less per day. Never drink and drive. 6. Depression- Your emotional health is as important as your physical health.  If you're feeling down or losing interest in things you normally enjoy please talk to your healthcare provider about being screened for depression. 7. Dental visit- Brush and floss your teeth twice daily; visit your dentist twice a year. 8. Eye doctor- Get an eye exam at least every   2 years. 9. Helmet use- Always wear a helmet when riding a bicycle, motorcycle, rollerblading or skateboarding. 10. Safe sex- If you may be exposed to sexually transmitted infections, use a condom. 11. Seat belts- Seat belts can save your live; always wear one. 12. Smoke/Carbon Monoxide detectors- These detectors need to  be installed on the appropriate level of your home. Replace batteries at least once a year. 13. Skin cancer- When out in the sun please cover up and use sunscreen 15 SPF or higher. 14. Violence- If anyone is threatening or hurting you, please tell your healthcare provider.        

## 2016-07-31 ENCOUNTER — Other Ambulatory Visit: Payer: Self-pay | Admitting: Physician Assistant

## 2016-07-31 LAB — HEPATIC FUNCTION PANEL
ALBUMIN: 4.7 g/dL (ref 3.6–5.1)
ALK PHOS: 92 U/L (ref 33–115)
ALT: 75 U/L — ABNORMAL HIGH (ref 6–29)
AST: 80 U/L — ABNORMAL HIGH (ref 10–35)
BILIRUBIN TOTAL: 0.5 mg/dL (ref 0.2–1.2)
Bilirubin, Direct: 0.1 mg/dL (ref <=0.2)
Indirect Bilirubin: 0.4 mg/dL (ref 0.2–1.2)
Total Protein: 7.7 g/dL (ref 6.1–8.1)

## 2016-07-31 LAB — CBC WITH DIFFERENTIAL/PLATELET
BASOS PCT: 0 %
Basophils Absolute: 0 cells/uL (ref 0–200)
EOS ABS: 194 {cells}/uL (ref 15–500)
EOS PCT: 2 %
HCT: 46 % — ABNORMAL HIGH (ref 35.0–45.0)
Hemoglobin: 15.7 g/dL — ABNORMAL HIGH (ref 11.7–15.5)
LYMPHS PCT: 17 %
Lymphs Abs: 1649 cells/uL (ref 850–3900)
MCH: 31.3 pg (ref 27.0–33.0)
MCHC: 34.1 g/dL (ref 32.0–36.0)
MCV: 91.6 fL (ref 80.0–100.0)
MONOS PCT: 6 %
MPV: 10 fL (ref 7.5–12.5)
Monocytes Absolute: 582 cells/uL (ref 200–950)
NEUTROS ABS: 7275 {cells}/uL (ref 1500–7800)
Neutrophils Relative %: 75 %
PLATELETS: 294 10*3/uL (ref 140–400)
RBC: 5.02 MIL/uL (ref 3.80–5.10)
RDW: 13.3 % (ref 11.0–15.0)
WBC: 9.7 10*3/uL (ref 3.8–10.8)

## 2016-07-31 LAB — BASIC METABOLIC PANEL
BUN: 16 mg/dL (ref 7–25)
CALCIUM: 9.9 mg/dL (ref 8.6–10.2)
CO2: 22 mmol/L (ref 20–31)
Chloride: 95 mmol/L — ABNORMAL LOW (ref 98–110)
Creat: 0.63 mg/dL (ref 0.50–1.10)
GLUCOSE: 206 mg/dL — AB (ref 65–99)
Potassium: 4.4 mmol/L (ref 3.5–5.3)
SODIUM: 134 mmol/L — AB (ref 135–146)

## 2016-07-31 LAB — ACUTE HEP PANEL AND HEP B SURFACE AB
HCV Ab: NEGATIVE
HEP A IGM: NONREACTIVE
HEP B S AB: POSITIVE — AB
HEP B S AG: NEGATIVE
Hep B C IgM: NONREACTIVE

## 2016-07-31 LAB — LIPID PANEL
CHOL/HDL RATIO: 7.3 ratio — AB (ref <=5.0)
Cholesterol: 233 mg/dL — ABNORMAL HIGH (ref 125–200)
HDL: 32 mg/dL — AB (ref >=46)
LDL Cholesterol: 123 mg/dL (ref <130)
Triglycerides: 390 mg/dL — ABNORMAL HIGH (ref <150)
VLDL: 78 mg/dL — AB (ref <30)

## 2016-07-31 LAB — HIV ANTIBODY (ROUTINE TESTING W REFLEX): HIV 1&2 Ab, 4th Generation: NONREACTIVE

## 2016-07-31 LAB — TSH: TSH: 1.53 m[IU]/L

## 2016-08-01 ENCOUNTER — Telehealth: Payer: Self-pay

## 2016-08-01 LAB — HEMOGLOBIN A1C
HEMOGLOBIN A1C: 8.6 % — AB (ref <5.7)
MEAN PLASMA GLUCOSE: 200 mg/dL

## 2016-08-01 MED ORDER — ATORVASTATIN CALCIUM 20 MG PO TABS: 20.0000 mg | ORAL_TABLET | Freq: Every day | ORAL | 3 refills | Status: DC

## 2016-08-01 MED ORDER — METFORMIN HCL 500 MG PO TABS: 1000.0000 mg | ORAL_TABLET | Freq: Two times a day (BID) | ORAL | 3 refills | Status: DC

## 2016-08-01 NOTE — Telephone Encounter (Signed)
Jessica Knox  Patient is requesting a glucose monitor and lancet strips -   [facility] - [address]    [phone]

## 2016-08-03 ENCOUNTER — Other Ambulatory Visit: Payer: Self-pay | Admitting: Emergency Medicine

## 2016-08-03 MED ORDER — BLOOD GLUCOSE MONITORING SUPPL KIT: PACK | 0 refills | Status: AC

## 2016-08-03 NOTE — Telephone Encounter (Signed)
Glucose monitor kit ordered and e-scribed to pharmacy.  I will notify patient

## 2016-08-07 ENCOUNTER — Other Ambulatory Visit: Payer: Self-pay

## 2016-08-07 MED ORDER — BUPROPION HCL ER (SR) 150 MG PO TB12: 150.0000 mg | ORAL_TABLET | Freq: Two times a day (BID) | ORAL | 1 refills | Status: DC

## 2016-08-07 MED ORDER — GLUCOSE BLOOD VI STRP: ORAL_STRIP | 3 refills | Status: DC

## 2016-08-07 MED ORDER — ONETOUCH ULTRASOFT LANCETS MISC: 3 refills | Status: DC

## 2016-08-07 NOTE — Telephone Encounter (Signed)
Patient stated that the pharmacy got her glucose kit but there were no strips ordered with it. So can someone please call her in some test strips that work with this machine.   Thank you, she uses the [facility] in [place] on [address].

## 2016-08-07 NOTE — Telephone Encounter (Signed)
Sent in test strips to [facility] as req'd. Spoke to pt and then also sent in lancets to [facility]. Jessica Knox, pt wanted to know if you would Rx her wellbutrin for her that Dr Jacinto HalimGanji started her on? She said she had discussed it with you at her OV, and wanted me to remind you that she weaned herself off the Zoloft so she no longer takes that. Pended.

## 2016-08-07 NOTE — Telephone Encounter (Signed)
Meds ordered this encounter  Medications  . glucose blood test strip    Sig: Test blood sugar daily    Dispense:  100 each    Refill:  3    ONE TOUCH ULTRA TEST STRIPS  . Lancets (ONETOUCH ULTRASOFT) lancets    Sig: Test blood sugar daily.    Dispense:  100 each    Refill:  3  . buPROPion (WELLBUTRIN SR) 150 MG 12 hr tablet    Sig: Take 1 tablet (150 mg total) by mouth 2 (two) times daily.    Dispense:  180 tablet    Refill:  1  . aspirin EC 81 MG tablet    Sig: Take 81 mg by mouth daily.

## 2016-08-09 ENCOUNTER — Other Ambulatory Visit: Payer: Self-pay | Admitting: Family Medicine

## 2016-08-09 DIAGNOSIS — E669 Obesity, unspecified: Secondary | ICD-10-CM

## 2016-08-09 DIAGNOSIS — E1169 Type 2 diabetes mellitus with other specified complication: Secondary | ICD-10-CM

## 2016-08-13 ENCOUNTER — Encounter: Payer: Self-pay | Admitting: Physician Assistant

## 2016-08-15 ENCOUNTER — Emergency Department (HOSPITAL_BASED_OUTPATIENT_CLINIC_OR_DEPARTMENT_OTHER): Payer: BLUE CROSS/BLUE SHIELD

## 2016-08-15 ENCOUNTER — Telehealth: Payer: Self-pay

## 2016-08-15 ENCOUNTER — Encounter: Payer: Self-pay | Admitting: Physician Assistant

## 2016-08-15 ENCOUNTER — Emergency Department (HOSPITAL_BASED_OUTPATIENT_CLINIC_OR_DEPARTMENT_OTHER)
Admission: EM | Admit: 2016-08-15 | Discharge: 2016-08-15 | Disposition: A | Discharge status: Home / Self Care | Payer: BLUE CROSS/BLUE SHIELD | Attending: Emergency Medicine | Admitting: Emergency Medicine

## 2016-08-15 ENCOUNTER — Encounter (HOSPITAL_BASED_OUTPATIENT_CLINIC_OR_DEPARTMENT_OTHER): Payer: Self-pay

## 2016-08-15 DIAGNOSIS — J45901 Unspecified asthma with (acute) exacerbation: Secondary | ICD-10-CM

## 2016-08-15 DIAGNOSIS — Z7984 Long term (current) use of oral hypoglycemic drugs: Secondary | ICD-10-CM | POA: Diagnosis not present

## 2016-08-15 DIAGNOSIS — I10 Essential (primary) hypertension: Secondary | ICD-10-CM | POA: Diagnosis not present

## 2016-08-15 DIAGNOSIS — E039 Hypothyroidism, unspecified: Secondary | ICD-10-CM | POA: Insufficient documentation

## 2016-08-15 DIAGNOSIS — Z7982 Long term (current) use of aspirin: Secondary | ICD-10-CM | POA: Diagnosis not present

## 2016-08-15 DIAGNOSIS — R0602 Shortness of breath: Secondary | ICD-10-CM | POA: Diagnosis present

## 2016-08-15 DIAGNOSIS — F17210 Nicotine dependence, cigarettes, uncomplicated: Secondary | ICD-10-CM | POA: Insufficient documentation

## 2016-08-15 DIAGNOSIS — E119 Type 2 diabetes mellitus without complications: Secondary | ICD-10-CM | POA: Insufficient documentation

## 2016-08-15 DIAGNOSIS — Z79899 Other long term (current) drug therapy: Secondary | ICD-10-CM | POA: Insufficient documentation

## 2016-08-15 HISTORY — DX: Type 2 diabetes mellitus without complications: E11.9

## 2016-08-15 LAB — CBG MONITORING, ED: Glucose-Capillary: 264 mg/dL — ABNORMAL HIGH (ref 65–99)

## 2016-08-15 MED ORDER — METHYLPREDNISOLONE SODIUM SUCC 125 MG IJ SOLR
125.0000 mg | Freq: Once | INTRAMUSCULAR | Status: AC
  Administered 2016-08-15: 125 mg via INTRAVENOUS
  Filled 2016-08-15: qty 2

## 2016-08-15 MED ORDER — ALBUTEROL SULFATE (2.5 MG/3ML) 0.083% IN NEBU
2.5000 mg | INHALATION_SOLUTION | Freq: Once | RESPIRATORY_TRACT | Status: AC
  Administered 2016-08-15: 2.5 mg via RESPIRATORY_TRACT

## 2016-08-15 MED ORDER — BENZONATATE 100 MG PO CAPS: 100.0000 mg | ORAL_CAPSULE | Freq: Three times a day (TID) | ORAL | 0 refills | Status: DC

## 2016-08-15 MED ORDER — ALBUTEROL SULFATE HFA 108 (90 BASE) MCG/ACT IN AERS
1.0000 | INHALATION_SPRAY | RESPIRATORY_TRACT | Status: DC | PRN
  Administered 2016-08-15: 2 via RESPIRATORY_TRACT
  Filled 2016-08-15: qty 6.7

## 2016-08-15 MED ORDER — IPRATROPIUM-ALBUTEROL 0.5-2.5 (3) MG/3ML IN SOLN
RESPIRATORY_TRACT | Status: AC
  Administered 2016-08-15: 3 mL
  Filled 2016-08-15: qty 6

## 2016-08-15 MED ORDER — ALBUTEROL (5 MG/ML) CONTINUOUS INHALATION SOLN
15.0000 mg | INHALATION_SOLUTION | RESPIRATORY_TRACT | Status: DC
  Administered 2016-08-15: 3 mg/h via RESPIRATORY_TRACT
  Administered 2016-08-15: 15 mg via RESPIRATORY_TRACT

## 2016-08-15 MED ORDER — IPRATROPIUM-ALBUTEROL 0.5-2.5 (3) MG/3ML IN SOLN
3.0000 mL | Freq: Once | RESPIRATORY_TRACT | Status: AC
  Administered 2016-08-15: 3 mL via RESPIRATORY_TRACT

## 2016-08-15 MED ORDER — IPRATROPIUM-ALBUTEROL 0.5-2.5 (3) MG/3ML IN SOLN
RESPIRATORY_TRACT | Status: AC
  Administered 2016-08-15: 3 mL via RESPIRATORY_TRACT
  Filled 2016-08-15: qty 3

## 2016-08-15 MED ORDER — ALBUTEROL SULFATE (2.5 MG/3ML) 0.083% IN NEBU
INHALATION_SOLUTION | RESPIRATORY_TRACT | Status: AC
  Administered 2016-08-15: 2.5 mg via RESPIRATORY_TRACT
  Filled 2016-08-15: qty 3

## 2016-08-15 MED ORDER — PREDNISONE 20 MG PO TABS: 40.0000 mg | ORAL_TABLET | Freq: Every day | ORAL | 0 refills | Status: DC

## 2016-08-15 MED ORDER — ALBUTEROL (5 MG/ML) CONTINUOUS INHALATION SOLN
INHALATION_SOLUTION | RESPIRATORY_TRACT | Status: AC
  Administered 2016-08-15: 3 mg/h via RESPIRATORY_TRACT
  Filled 2016-08-15: qty 20

## 2016-08-15 NOTE — ED Provider Notes (Signed)
[facility] DEPT [facility] Provider Note   CSN: [id number] Arrival date & time: 08/15/16  1245     History   Chief Complaint Chief Complaint  Patient presents with  . Shortness of Breath    HPI Jessica Knox is a 50 y.o. female.  HPI   50 year old female presents today with complaints of shortness of breath, chills status post influenza vaccine 2 weeks ago. Patient notes that after the vaccination she started to feel ill and that she was getting sick. She reports symptoms continue to persist with worsening this morning. She reports shortness of breath, wheezing and fatigue. Husband notes that symptoms have been persistent and the same for the last 2 weeks. Patient denies any fever at home, reports that she's been using inhaler she was previously prescribed but has not been improving her symptoms. She denies any associated chest pain, abdominal pain, nausea or vomiting. Patient reports minor upper respiratory congestion. She denies any history of the same. She denies any lotion swelling or edema, history DVT or PE.   Past Medical History:  Diagnosis Date  . Diabetes mellitus without complication ([place])   . GERD (gastroesophageal reflux disease)   . Hypertension   . SVT (supraventricular tachycardia) ([place])   . Thyroid disease    multinodular thyroid total thyroidectomy    Patient Active Problem List   Diagnosis Date Noted  . Hypothyroidism 07/30/2016  . Essential hypertension 07/30/2016  . Diabetes mellitus type 2 in obese ([place]) 07/30/2016  . Adjustment disorder with mixed anxiety and depressed mood 07/30/2016  . History of colonic polyps 07/30/2016  . Smoker 01/26/2015  . BMI 28.0-28.9,adult 01/26/2015  . Stress incontinence 01/26/2015  . Adnexal pain 12/17/2014  . Abdominal pain 04/24/2013  . Pancreatitis 04/24/2013    Past Surgical History:  Procedure Laterality Date  . ABDOMINAL HYSTERECTOMY    . CHOLECYSTECTOMY    . RIGHT OOPHORECTOMY  12/06/2012  . RIGHT  OOPHORECTOMY Right   . ROBOTIC ASSISTED DIAGNOSTIC LAPAROSCOPY  01/10/2015  . THYROIDECTOMY      OB History    No data available       Home Medications    Prior to Admission medications   Medication Sig Start Date End Date Taking? Authorizing Provider  ALPRAZolam Duanne Moron) 0.5 MG tablet Take 1 tablet (0.5 mg total) by mouth 2 (two) times daily as needed. for anxiety Patient taking differently: Take 1 mg by mouth 2 (two) times daily as needed. for anxiety 07/30/16   Harrison Mons, PA-C  aspirin EC 81 MG tablet Take 81 mg by mouth daily.    Historical Provider, MD  atorvastatin (LIPITOR) 20 MG tablet Take 1 tablet (20 mg total) by mouth daily. 08/01/16   Chelle Jeffery, PA-C  benzonatate (TESSALON) 100 MG capsule Take 1 capsule (100 mg total) by mouth every 8 (eight) hours. 08/15/16   Okey Regal, PA-C  Blood Glucose Monitoring Suppl KIT Check blood sugar daily as needed 08/03/16   Chelle Jeffery, PA-C  buPROPion (WELLBUTRIN SR) 150 MG 12 hr tablet Take 1 tablet (150 mg total) by mouth 2 (two) times daily. 08/07/16   Chelle Jeffery, PA-C  cetirizine (ZYRTEC) 10 MG tablet Take 10 mg by mouth daily.    Historical Provider, MD  diltiazem (CARTIA XT) 180 MG 24 hr capsule TAKE 1 CAPSULE(180 MG) BY MOUTH DAILY 06/26/16   Elby Beck, FNP  esomeprazole (NEXIUM) 40 MG capsule TAKE ONE CAPSULE BY MOUTH DAILY 06/26/16   Elby Beck, FNP  estradiol (ESTRACE) 1 MG  tablet TAKE 1 TABLET(1 MG) BY MOUTH DAILY 06/26/16   Elby Beck, FNP  glucose blood test strip Test blood sugar daily 08/07/16   Chelle Jeffery, PA-C  Ipratropium-Albuterol (COMBIVENT) 20-100 MCG/ACT AERS respimat Inhale 1 puff into the lungs every 6 (six) hours. 07/30/16   Chelle Jeffery, PA-C  Lancets (ONETOUCH ULTRASOFT) lancets Test blood sugar daily. 08/07/16   Chelle Jeffery, PA-C  levothyroxine (SYNTHROID, LEVOTHROID) 175 MCG tablet Take 1 tablet (175 mcg total) by mouth daily before breakfast. 01/30/16   Elby Beck,  FNP  meloxicam (MOBIC) 15 MG tablet Take 15 mg by mouth daily.    Historical Provider, MD  metFORMIN (GLUCOPHAGE) 500 MG tablet Take 2 tablets (1,000 mg total) by mouth 2 (two) times daily with a meal. 08/01/16 10/30/16  Chelle Jeffery, PA-C  metoprolol (LOPRESSOR) 50 MG tablet Take 50 mg by mouth 2 (two) times daily.    Historical Provider, MD  nitrofurantoin (MACRODANTIN) 50 MG capsule Take 1 capsule (50 mg total) by mouth at bedtime. 06/26/16   Elby Beck, FNP  ondansetron (ZOFRAN-ODT) 8 MG disintegrating tablet Take 1 tablet (8 mg total) by mouth every 8 (eight) hours as needed for nausea. 07/19/16   Wardell [place], MD  predniSONE (DELTASONE) 20 MG tablet Take 2 tablets (40 mg total) by mouth daily with breakfast. 08/15/16   Okey Regal, PA-C  spironolactone (ALDACTONE) 50 MG tablet Take 1 tablet (50 mg total) by mouth daily. 06/26/16   Elby Beck, FNP  valsartan-hydrochlorothiazide (DIOVAN-HCT) 160-25 MG tablet Take 1 tablet by mouth daily.    Historical Provider, MD    Family History Family History  Problem Relation Age of Onset  . Adopted: Yes  . Family history unknown: Yes    Social History Social History  Substance Use Topics  . Smoking status: Current Every Day Smoker    Packs/day: 1.00    Years: 30.00    Types: Cigarettes  . Smokeless tobacco: Never Used  . Alcohol use 0.0 oz/week     Allergies   Morphine and related; Prednisone; and Ciprofloxacin   Review of Systems Review of Systems  All other systems reviewed and are negative.    Physical Exam Updated Vital Signs BP 107/60 (BP Location: Right Arm)   Pulse 105   Temp 97.7 F (36.5 C) (Oral)   Resp 26   Ht '4\' 11"'  (1.499 m)   Wt 74.8 kg   SpO2 97%   BMI 33.33 kg/m   Physical Exam  Constitutional: She is oriented to person, place, and time. She appears well-developed and well-nourished.  HENT:  Head: Normocephalic and atraumatic.  Eyes: Conjunctivae are normal. Pupils are equal, round,  and reactive to light. Right eye exhibits no discharge. Left eye exhibits no discharge. No scleral icterus.  Neck: Normal range of motion. No JVD present. No tracheal deviation present.  Pulmonary/Chest: Effort normal. No stridor. No respiratory distress. She has wheezes. She has no rales. She exhibits no tenderness.  Expiratory wheeze diffuse  Musculoskeletal: She exhibits no edema.  Neurological: She is alert and oriented to person, place, and time. Coordination normal.  Psychiatric: She has a normal mood and affect. Her behavior is normal. Judgment and thought content normal.  Nursing note and vitals reviewed.    ED Treatments / Results  Labs (all labs ordered are listed, but only abnormal results are displayed) Labs Reviewed  CBG MONITORING, ED - Abnormal; Notable for the following:       Result Value  Glucose-Capillary 264 (*)    All other components within normal limits    EKG  EKG Interpretation None       Radiology Dg Chest 2 View  Result Date: 08/15/2016 CLINICAL DATA:  Chills, shortness of breath after giving flu and pneumonia shot. Cough EXAM: CHEST  2 VIEW COMPARISON:  03/15/2016 FINDINGS: The heart size and mediastinal contours are within normal limits. Mild interstitial prominence but without focal infiltrate, consolidation or effusion. Bilateral AC joint degenerative changes. IMPRESSION: No active cardiopulmonary disease. Electronically Signed   By: Donavan Foil M.D.   On: 08/15/2016 14:09    Procedures Procedures (including critical care time)  Medications Ordered in ED Medications  albuterol (PROVENTIL,VENTOLIN) solution continuous neb (15 mg Nebulization New Bag/Given 08/15/16 1520)  albuterol (PROVENTIL HFA;VENTOLIN HFA) 108 (90 Base) MCG/ACT inhaler 1-2 puff (not administered)  albuterol (PROVENTIL) (2.5 MG/3ML) 0.083% nebulizer solution 2.5 mg (2.5 mg Nebulization Given 08/15/16 1309)  ipratropium-albuterol (DUONEB) 0.5-2.5 (3) MG/3ML nebulizer  solution 3 mL (3 mLs Nebulization Given 08/15/16 1309)  ipratropium-albuterol (DUONEB) 0.5-2.5 (3) MG/3ML nebulizer solution (3 mLs  Given 08/15/16 1325)  methylPREDNISolone sodium succinate (SOLU-MEDROL) 125 mg/2 mL injection 125 mg (125 mg Intravenous Given 08/15/16 1410)     Initial Impression / Assessment and Plan / ED Course  I have reviewed the triage vital signs and the nursing notes.  Pertinent labs & imaging results that were available during my care of the patient were reviewed by me and considered in my medical decision making (see chart for details).  Clinical Course     Final Clinical Impressions(s) / ED Diagnoses   Final diagnoses:  Exacerbation of asthma, unspecified asthma severity, unspecified whether persistent    Labs:  Imaging:  Consults:  Therapeutics:  Discharge Meds: Albuterol  Assessment/Plan:  50 year old female presents today with asthma exacerbation. Usually had diffuse wheezes, dramatically improved with breathing treatment here. Patient has clear chest x-ray, improvement in lung sounds, with no signs of respiratory distress. I personally ambulated the patient with pulse ox patient remained 97/98% during ambulation speaking full sentences. Patient does have new diagnosis of diabetes, I feel steroids would provide significant improvement in her symptoms and help with any rebounding symptoms. Patient will be given steroids here, and a short course of outpatient steroids. Patient does have glucose monitoring device at home, she is encouraged to closely monitor her glucose, follow-up with her primary care tomorrow for reevaluation. She is instructed return immediately if any new or worsening signs or symptoms present. She verbalized understanding and agreement to today's plan and had no further questions or concerns at time of discharge   New Prescriptions New Prescriptions   BENZONATATE (TESSALON) 100 MG CAPSULE    Take 1 capsule (100 mg total) by mouth  every 8 (eight) hours.   PREDNISONE (DELTASONE) 20 MG TABLET    Take 2 tablets (40 mg total) by mouth daily with breakfast.     Okey Regal, PA-C 08/15/16 1643    Merrily Pew, MD 08/16/16 1534

## 2016-08-15 NOTE — ED Triage Notes (Signed)
C/o SOB, chills that started 24 hours after flu and PNC vaccine 2 weeks ago-pt presents with hard dry cough, forced exp phase when speaking

## 2016-08-15 NOTE — ED Notes (Signed)
 15mg  Albuterol cont. Complete.

## 2016-08-15 NOTE — ED Notes (Signed)
RT at the bedside.

## 2016-08-15 NOTE — ED Notes (Signed)
PA at the bedside.

## 2016-08-15 NOTE — Telephone Encounter (Signed)
Pt called wanting to know if she should go to ED or come here to be seen for her illness. I informed her she had a bad debt balance of $250.51. We can see her here once this balance is paid off. She would like a CB from Chelle. [phone]

## 2016-08-15 NOTE — Telephone Encounter (Signed)
Attempted to call pt, left VM for pt to call back  

## 2016-08-15 NOTE — Discharge Instructions (Signed)
Please read attached information. If you experience any new or worsening signs or symptoms please return to the emergency room for evaluation. Please follow-up with your primary care provider tomorrow as discussed. Please use medication prescribed only as directed and discontinue taking if you have any concerning signs or symptoms.

## 2016-08-16 ENCOUNTER — Encounter: Payer: Self-pay | Admitting: Physician Assistant

## 2016-08-16 ENCOUNTER — Telehealth: Payer: Self-pay | Admitting: Physician Assistant

## 2016-08-16 MED ORDER — GLIPIZIDE 10 MG PO TABS: 10.0000 mg | ORAL_TABLET | Freq: Two times a day (BID) | ORAL | 0 refills | Status: DC

## 2016-08-16 NOTE — Telephone Encounter (Signed)
Please give her a call back and find out more.  When does she need to be seen?

## 2016-08-16 NOTE — Telephone Encounter (Signed)
Caller name: Relationship to patient: Self Can be reached: [phone] Pharmacy:  Reason for call: Patient states she would like a call back from Dr. Patsy Lageropland because she know her and  She need to see if Dr Patsy Lageropland will do a ER Follow up for her because she can't get in with her PCP. Plse adv

## 2016-08-17 MED ORDER — ONETOUCH ULTRASOFT LANCETS MISC: 3 refills | Status: AC

## 2016-08-17 NOTE — Telephone Encounter (Signed)
  Spoke to pt who informed me that she does not need to be seen by Dr. Patsy Lageropland right now. Pt states that someone has already taken care of her request. Pt wanted to thank Dr. Patsy Lageropland for offering to she her.

## 2016-08-17 NOTE — Addendum Note (Signed)
Addended by: Fernande BrasJEFFERY, Karston Hyland S on: 08/17/2016 01:38 PM   Modules accepted: Orders

## 2016-08-17 NOTE — Telephone Encounter (Signed)
Meds ordered this encounter  Medications  . DISCONTD: glipiZIDE (GLUCOTROL) 10 MG tablet    Sig: Take 1 tablet (10 mg total) by mouth 2 (two) times daily before a meal.    Dispense:  60 tablet    Refill:  0    Order Specific Question:   Supervising Provider    Answer:   SHAW, EVA N [[id number]]  . glipiZIDE (GLUCOTROL) 10 MG tablet    Sig: Take 1 tablet (10 mg total) by mouth 2 (two) times daily before a meal.    Dispense:  60 tablet    Refill:  0    Order Specific Question:   Supervising Provider    Answer:   SHAW, EVA N [[id number]]  . Lancets (ONETOUCH ULTRASOFT) lancets    Sig: Test blood sugar daily.    Dispense:  100 each    Refill:  3    ONE TOUCH ULTRASOFT 2 LANCETS    Order Specific Question:   Supervising Provider    Answer:   Clelia CroftSHAW, EVA N [[id number]]

## 2016-08-20 ENCOUNTER — Encounter: Payer: Self-pay | Admitting: Physician Assistant

## 2016-08-20 NOTE — Telephone Encounter (Signed)
Looking into patients chart [facility]

## 2016-08-21 ENCOUNTER — Encounter: Payer: BLUE CROSS/BLUE SHIELD | Admitting: Physician Assistant

## 2016-08-21 NOTE — Telephone Encounter (Signed)
Chelle, we have not written this for her before. Please review.

## 2016-08-22 MED ORDER — ALBUTEROL SULFATE HFA 108 (90 BASE) MCG/ACT IN AERS: 2.0000 | INHALATION_SPRAY | RESPIRATORY_TRACT | 1 refills | Status: DC | PRN

## 2016-08-22 NOTE — Telephone Encounter (Signed)
Patient notified via My Chart.  Meds ordered this encounter  Medications  . albuterol (PROVENTIL HFA;VENTOLIN HFA) 108 (90 Base) MCG/ACT inhaler    Sig: Inhale 2 puffs into the lungs every 4 (four) hours as needed for wheezing or shortness of breath (cough, shortness of breath or wheezing.).    Dispense:  1 Inhaler    Refill:  1    Order Specific Question:   Supervising Provider    Answer:   Clelia CroftSHAW, EVA N [[id number]]

## 2016-08-28 ENCOUNTER — Other Ambulatory Visit: Payer: Self-pay

## 2016-08-28 DIAGNOSIS — E1169 Type 2 diabetes mellitus with other specified complication: Secondary | ICD-10-CM

## 2016-08-28 DIAGNOSIS — R0602 Shortness of breath: Secondary | ICD-10-CM

## 2016-08-28 DIAGNOSIS — F4323 Adjustment disorder with mixed anxiety and depressed mood: Secondary | ICD-10-CM

## 2016-08-28 DIAGNOSIS — E669 Obesity, unspecified: Secondary | ICD-10-CM

## 2016-08-28 MED ORDER — GLUCOSE BLOOD VI STRP: ORAL_STRIP | 1 refills | Status: AC

## 2016-08-28 MED ORDER — IPRATROPIUM-ALBUTEROL 20-100 MCG/ACT IN AERS: 1.0000 | INHALATION_SPRAY | Freq: Four times a day (QID) | RESPIRATORY_TRACT | 1 refills | Status: DC

## 2016-08-28 MED ORDER — ATORVASTATIN CALCIUM 20 MG PO TABS: 20.0000 mg | ORAL_TABLET | Freq: Every day | ORAL | 1 refills | Status: DC

## 2016-08-28 MED ORDER — ALBUTEROL SULFATE HFA 108 (90 BASE) MCG/ACT IN AERS: 2.0000 | INHALATION_SPRAY | RESPIRATORY_TRACT | 0 refills | Status: DC | PRN

## 2016-08-28 MED ORDER — MELOXICAM 15 MG PO TABS: ORAL_TABLET | ORAL | 1 refills | Status: DC

## 2016-08-28 NOTE — Telephone Encounter (Signed)
The alprazolam is my only concern. I do not write for 90-day supplies of controlled substances.  Please clarify how she takes it, and I will authorize a 30-day supply.  Meds ordered this encounter  Medications  . albuterol (PROVENTIL HFA;VENTOLIN HFA) 108 (90 Base) MCG/ACT inhaler    Sig: Inhale 2 puffs into the lungs every 4 (four) hours as needed for wheezing or shortness of breath (cough, shortness of breath or wheezing.).    Dispense:  3 Inhaler    Refill:  0  . atorvastatin (LIPITOR) 20 MG tablet    Sig: Take 1 tablet (20 mg total) by mouth daily.    Dispense:  90 tablet    Refill:  1  . meloxicam (MOBIC) 15 MG tablet    Sig: Take 15 mg by mouth daily.    Dispense:  90 tablet    Refill:  1  . Ipratropium-Albuterol (COMBIVENT) 20-100 MCG/ACT AERS respimat    Sig: Inhale 1 puff into the lungs every 6 (six) hours.    Dispense:  3 Inhaler    Refill:  1  . glucose blood test strip    Sig: Use as instructed    Dispense:  100 each    Refill:  1

## 2016-08-28 NOTE — Telephone Encounter (Signed)
07/30/16 last ov and labs Pharmacy request 90 day supplies. Please rerfill as appropriate.

## 2016-08-28 NOTE — Telephone Encounter (Signed)
LMOM to CB and let us know how she takes her alprazolam and if she wants a 30 day from [facility] or sent locally.

## 2016-08-28 NOTE — Telephone Encounter (Signed)
Voice message left for pt to call back how she takes her xanax.

## 2016-08-30 MED ORDER — ALPRAZOLAM 0.5 MG PO TABS: 0.5000 mg | ORAL_TABLET | Freq: Two times a day (BID) | ORAL | 0 refills | Status: DC | PRN

## 2016-08-30 NOTE — Telephone Encounter (Signed)
Pt called back and asked that we go ahead and send the 30 day xanax refill to [facility] also. She takes it BID prn. I will print a 1 mos refill as approved by Chelle below.

## 2016-08-30 NOTE — Telephone Encounter (Signed)
Spoke to pt who reported that over last couple of weeks since she finished the prednisone, her BS has averaged 260 (13 readings), low of 167, high of 352. Pt I asked pt if she has been drinking plenty of water and trying to exercise like Chelle advised and pt stated that her back L4-5 have been bothering her a lot and it keeps her from doing too much exercise. Pt is also very concerned about getting some weight off because it affects her back and hips so much. Reported that she no longer takes adderall, and has decreased smoking from 2 packs per day down to 1/2 pack, but is replacing it with salty foods. Pt also wanted to ask Chelle if she would consider putting her on Contrave the new diet med she has heard about on TV. Or if there is another class of DM med that might help with weight along with better controlling her BS?

## 2016-08-30 NOTE — Telephone Encounter (Signed)
Please contact this patient. I just received two messages from her via My Chart, dated 10/12 and 10/14. It's not clear to me that these are still issues, or that she replied to my question in my previous response. She should have completed the prednisone taper. What are her blood sugars running now?

## 2016-08-31 NOTE — Telephone Encounter (Signed)
Chelle, please see message concerning pt's status that I forwarded to you yesterday morning on the other 10/12 email. I think your answers are there, but let me know if you need for me to ask her further questions.

## 2016-09-03 ENCOUNTER — Encounter: Payer: Self-pay | Admitting: Physician Assistant

## 2016-09-03 DIAGNOSIS — R635 Abnormal weight gain: Secondary | ICD-10-CM

## 2016-09-03 DIAGNOSIS — E119 Type 2 diabetes mellitus without complications: Secondary | ICD-10-CM

## 2016-09-03 NOTE — Telephone Encounter (Signed)
The message from the patient's phone message 08/16/2016, from a contact with patient 10/26:  Spoke to pt who reported that over last couple of weeks since she finished the prednisone, her BS has averaged 260 (13 readings), low of 167, high of 352. Pt I asked pt if she has been drinking plenty of water and trying to exercise like Melesio Madara advised and pt stated that her back L4-5 have been bothering her a lot and it keeps her from doing too much exercise. Pt is also very concerned about getting some weight off because it affects her back and hips so much. Reported that she no longer takes adderall, and has decreased smoking from 2 packs per day down to 1/2 pack, but is replacing it with salty foods. Pt also wanted to ask Brittinie Wherley if she would consider putting her on Contrave the new diet med she has heard about on TV. Or if there is another class of DM med that might help with weight along with better controlling her BS?  She is on the metformin 1000 mg BID, and glipizide 10 mg BID. Next is to add Invokana 100 mg QD or Farxiga 5 mg QD.  Until her glucose is better controlled, I'm not comfortable prescribing weight loss medications. Contrave is a combination of naltrexone and bupropion. She already takes bupropion in the form of Wellbutrin, so Contrave isn't appropriate for her.  Is she interested in seeing a nutritionist? Swimming would be an excellent exercise for her, and there are a number of indoor pools she can join ([employer], [facility], etc).

## 2016-09-10 ENCOUNTER — Encounter: Payer: Self-pay | Admitting: Physician Assistant

## 2016-09-10 ENCOUNTER — Telehealth: Payer: Self-pay

## 2016-09-10 NOTE — Telephone Encounter (Signed)
PATIENT WANTS THIS MESSAGE TO GO TO CHELLE AS HIGH PRIORITY:  SHE WOULD LIKE A CALL BACK TODAY FROM CHELLE REGARDING HER CIRCULATION AND MUSCULAR PROBLEMS. SHE THINKS IT IS FROM THE METFORMIN. SHE SAID HER AND CHELLE HAVE BEEN GOING BACK AND FORTH ABOUT IT. BEST PHONE [phone] (CELL) MBC

## 2016-09-11 ENCOUNTER — Other Ambulatory Visit: Payer: Self-pay

## 2016-09-11 DIAGNOSIS — F4323 Adjustment disorder with mixed anxiety and depressed mood: Secondary | ICD-10-CM

## 2016-09-11 NOTE — Telephone Encounter (Signed)
[facility] fax request for New prescription for alprazolam 1mg  tabs.  Please advise

## 2016-09-12 MED ORDER — GLIPIZIDE 10 MG PO TABS: 10.0000 mg | ORAL_TABLET | Freq: Two times a day (BID) | ORAL | 0 refills | Status: DC

## 2016-09-12 NOTE — Telephone Encounter (Signed)
Please see ph message from 11/6 also. I sent in the glipizide, and is having trouble with the metformin.

## 2016-09-12 NOTE — Telephone Encounter (Signed)
Chelle, I apologize I am just sending this high priority message. I have been out sick and just returned this morning.

## 2016-09-12 NOTE — Addendum Note (Signed)
Addended by: Sheppard PlumberBRIGGS, Shail Urbas A on: 09/12/2016 11:31 AM   Modules accepted: Orders

## 2016-09-12 NOTE — Telephone Encounter (Signed)
She needs to come in for evaluation. The way to know if her symptoms are related to the metformin is to stop it and see if the symptoms resolve. It's extremely unlikely that it's related to any circulatory problems. Rarely, it can cause lactic acidosis, which can cause muscle pain.  We have been "going back and forth" on My Chart. She hasn't sent me her glucose readings, despite several requests, as she continues to report that the readings are "high."

## 2016-09-13 MED ORDER — ALPRAZOLAM 0.5 MG PO TABS: 0.5000 mg | ORAL_TABLET | Freq: Two times a day (BID) | ORAL | 0 refills | Status: DC | PRN

## 2016-09-13 NOTE — Telephone Encounter (Signed)
Left detailed message for pt w/message from Chelle.

## 2016-09-13 NOTE — Telephone Encounter (Signed)
Faxed to [facility].

## 2016-09-13 NOTE — Telephone Encounter (Signed)
Meds ordered this encounter  Medications  . ALPRAZolam (XANAX) 0.5 MG tablet    Sig: Take 1 tablet (0.5 mg total) by mouth 2 (two) times daily as needed for anxiety.    Dispense:  60 tablet    Refill:  0    

## 2016-09-17 ENCOUNTER — Other Ambulatory Visit: Payer: Self-pay | Admitting: Family Medicine

## 2016-09-17 DIAGNOSIS — I10 Essential (primary) hypertension: Secondary | ICD-10-CM

## 2016-09-19 ENCOUNTER — Other Ambulatory Visit: Payer: Self-pay

## 2016-09-19 ENCOUNTER — Ambulatory Visit: Payer: BLUE CROSS/BLUE SHIELD

## 2016-09-20 ENCOUNTER — Encounter (HOSPITAL_BASED_OUTPATIENT_CLINIC_OR_DEPARTMENT_OTHER): Payer: Self-pay | Admitting: *Deleted

## 2016-09-20 ENCOUNTER — Encounter: Payer: Self-pay | Admitting: Physician Assistant

## 2016-09-20 ENCOUNTER — Inpatient Hospital Stay (HOSPITAL_BASED_OUTPATIENT_CLINIC_OR_DEPARTMENT_OTHER)
Admission: EM | Admit: 2016-09-20 | Discharge: 2016-09-27 | DRG: 603 | Disposition: A | Discharge status: Home Health | Payer: BLUE CROSS/BLUE SHIELD | Attending: Internal Medicine | Admitting: Internal Medicine

## 2016-09-20 DIAGNOSIS — E039 Hypothyroidism, unspecified: Secondary | ICD-10-CM | POA: Diagnosis present

## 2016-09-20 DIAGNOSIS — R21 Rash and other nonspecific skin eruption: Secondary | ICD-10-CM | POA: Diagnosis present

## 2016-09-20 DIAGNOSIS — Z7982 Long term (current) use of aspirin: Secondary | ICD-10-CM

## 2016-09-20 DIAGNOSIS — R609 Edema, unspecified: Secondary | ICD-10-CM

## 2016-09-20 DIAGNOSIS — L03115 Cellulitis of right lower limb: Principal | ICD-10-CM

## 2016-09-20 DIAGNOSIS — R109 Unspecified abdominal pain: Secondary | ICD-10-CM

## 2016-09-20 DIAGNOSIS — Z7984 Long term (current) use of oral hypoglycemic drugs: Secondary | ICD-10-CM

## 2016-09-20 DIAGNOSIS — Z791 Long term (current) use of non-steroidal anti-inflammatories (NSAID): Secondary | ICD-10-CM

## 2016-09-20 DIAGNOSIS — E1169 Type 2 diabetes mellitus with other specified complication: Secondary | ICD-10-CM

## 2016-09-20 DIAGNOSIS — E876 Hypokalemia: Secondary | ICD-10-CM | POA: Diagnosis not present

## 2016-09-20 DIAGNOSIS — M25673 Stiffness of unspecified ankle, not elsewhere classified: Secondary | ICD-10-CM

## 2016-09-20 DIAGNOSIS — E669 Obesity, unspecified: Secondary | ICD-10-CM | POA: Diagnosis present

## 2016-09-20 DIAGNOSIS — Z79899 Other long term (current) drug therapy: Secondary | ICD-10-CM

## 2016-09-20 DIAGNOSIS — M7989 Other specified soft tissue disorders: Secondary | ICD-10-CM

## 2016-09-20 DIAGNOSIS — Z7952 Long term (current) use of systemic steroids: Secondary | ICD-10-CM

## 2016-09-20 DIAGNOSIS — T79A21A Traumatic compartment syndrome of right lower extremity, initial encounter: Secondary | ICD-10-CM

## 2016-09-20 DIAGNOSIS — M79661 Pain in right lower leg: Secondary | ICD-10-CM

## 2016-09-20 DIAGNOSIS — K219 Gastro-esophageal reflux disease without esophagitis: Secondary | ICD-10-CM | POA: Diagnosis present

## 2016-09-20 DIAGNOSIS — M21371 Foot drop, right foot: Secondary | ICD-10-CM | POA: Diagnosis present

## 2016-09-20 DIAGNOSIS — R740 Nonspecific elevation of levels of transaminase and lactic acid dehydrogenase [LDH]: Secondary | ICD-10-CM

## 2016-09-20 DIAGNOSIS — M79A21 Nontraumatic compartment syndrome of right lower extremity: Secondary | ICD-10-CM | POA: Diagnosis present

## 2016-09-20 DIAGNOSIS — E034 Atrophy of thyroid (acquired): Secondary | ICD-10-CM

## 2016-09-20 DIAGNOSIS — E890 Postprocedural hypothyroidism: Secondary | ICD-10-CM | POA: Diagnosis present

## 2016-09-20 DIAGNOSIS — E785 Hyperlipidemia, unspecified: Secondary | ICD-10-CM | POA: Diagnosis present

## 2016-09-20 DIAGNOSIS — I471 Supraventricular tachycardia: Secondary | ICD-10-CM | POA: Diagnosis present

## 2016-09-20 DIAGNOSIS — F17210 Nicotine dependence, cigarettes, uncomplicated: Secondary | ICD-10-CM | POA: Diagnosis present

## 2016-09-20 DIAGNOSIS — M6282 Rhabdomyolysis: Secondary | ICD-10-CM

## 2016-09-20 DIAGNOSIS — J45909 Unspecified asthma, uncomplicated: Secondary | ICD-10-CM | POA: Diagnosis present

## 2016-09-20 DIAGNOSIS — I10 Essential (primary) hypertension: Secondary | ICD-10-CM | POA: Diagnosis present

## 2016-09-20 DIAGNOSIS — R262 Difficulty in walking, not elsewhere classified: Secondary | ICD-10-CM

## 2016-09-20 DIAGNOSIS — R7401 Elevation of levels of liver transaminase levels: Secondary | ICD-10-CM

## 2016-09-20 DIAGNOSIS — D649 Anemia, unspecified: Secondary | ICD-10-CM | POA: Diagnosis present

## 2016-09-20 DIAGNOSIS — E119 Type 2 diabetes mellitus without complications: Secondary | ICD-10-CM | POA: Diagnosis present

## 2016-09-20 LAB — CBC WITH DIFFERENTIAL/PLATELET
Basophils Absolute: 0 10*3/uL (ref 0.0–0.1)
Basophils Relative: 0 %
EOS ABS: 0.2 10*3/uL (ref 0.0–0.7)
Eosinophils Relative: 2 %
HEMATOCRIT: 35.9 % — AB (ref 36.0–46.0)
HEMOGLOBIN: 12 g/dL (ref 12.0–15.0)
LYMPHS ABS: 1.2 10*3/uL (ref 0.7–4.0)
LYMPHS PCT: 13 %
MCH: 31.2 pg (ref 26.0–34.0)
MCHC: 33.4 g/dL (ref 30.0–36.0)
MCV: 93.2 fL (ref 78.0–100.0)
Monocytes Absolute: 0.7 10*3/uL (ref 0.1–1.0)
Monocytes Relative: 7 %
NEUTROS ABS: 7.7 10*3/uL (ref 1.7–7.7)
NEUTROS PCT: 78 %
Platelets: 291 10*3/uL (ref 150–400)
RBC: 3.85 MIL/uL — AB (ref 3.87–5.11)
RDW: 12.7 % (ref 11.5–15.5)
WBC: 9.9 10*3/uL (ref 4.0–10.5)

## 2016-09-20 LAB — TSH: TSH: 52.086 u[IU]/mL — ABNORMAL HIGH (ref 0.350–4.500)

## 2016-09-20 LAB — URINE MICROSCOPIC-ADD ON

## 2016-09-20 LAB — I-STAT CG4 LACTIC ACID, ED
LACTIC ACID, VENOUS: 2.88 mmol/L — AB (ref 0.5–1.9)
Lactic Acid, Venous: 1.81 mmol/L (ref 0.5–1.9)

## 2016-09-20 LAB — COMPREHENSIVE METABOLIC PANEL
ALK PHOS: 118 U/L (ref 38–126)
ALT: 141 U/L — AB (ref 14–54)
AST: 199 U/L — ABNORMAL HIGH (ref 15–41)
Albumin: 4.3 g/dL (ref 3.5–5.0)
Anion gap: 13 (ref 5–15)
BILIRUBIN TOTAL: 0.8 mg/dL (ref 0.3–1.2)
BUN: 9 mg/dL (ref 6–20)
CALCIUM: 9.3 mg/dL (ref 8.9–10.3)
CO2: 27 mmol/L (ref 22–32)
CREATININE: 0.68 mg/dL (ref 0.44–1.00)
Chloride: 90 mmol/L — ABNORMAL LOW (ref 101–111)
GFR calc non Af Amer: >60 mL/min (ref >60)
GLUCOSE: 259 mg/dL — AB (ref 65–99)
Potassium: 3.7 mmol/L (ref 3.5–5.1)
SODIUM: 130 mmol/L — AB (ref 135–145)
Total Protein: 7.8 g/dL (ref 6.5–8.1)

## 2016-09-20 LAB — URINALYSIS, ROUTINE W REFLEX MICROSCOPIC
BILIRUBIN URINE: NEGATIVE
Glucose, UA: 100 mg/dL — AB
HGB URINE DIPSTICK: NEGATIVE
Ketones, ur: NEGATIVE mg/dL
Nitrite: NEGATIVE
PH: 6.5 (ref 5.0–8.0)
Protein, ur: NEGATIVE mg/dL
SPECIFIC GRAVITY, URINE: 1.007 (ref 1.005–1.030)

## 2016-09-20 LAB — T4, FREE: Free T4: 0.31 ng/dL — ABNORMAL LOW (ref 0.61–1.12)

## 2016-09-20 LAB — C-REACTIVE PROTEIN: CRP: 12.3 mg/dL — ABNORMAL HIGH (ref <1.0)

## 2016-09-20 LAB — MAGNESIUM: Magnesium: 1.6 mg/dL — ABNORMAL LOW (ref 1.7–2.4)

## 2016-09-20 LAB — SEDIMENTATION RATE: Sed Rate: 68 mm/hr — ABNORMAL HIGH (ref 0–22)

## 2016-09-20 MED ORDER — MAGNESIUM SULFATE 2 GM/50ML IV SOLN
2.0000 g | Freq: Once | INTRAVENOUS | Status: AC
  Administered 2016-09-20: 2 g via INTRAVENOUS
  Filled 2016-09-20: qty 50

## 2016-09-20 MED ORDER — HYDROMORPHONE HCL 1 MG/ML IJ SOLN
0.5000 mg | Freq: Once | INTRAMUSCULAR | Status: AC
  Administered 2016-09-20: 0.5 mg via INTRAVENOUS
  Filled 2016-09-20: qty 1

## 2016-09-20 MED ORDER — SODIUM CHLORIDE 0.9 % IV BOLUS (SEPSIS)
500.0000 mL | Freq: Once | INTRAVENOUS | Status: AC
  Administered 2016-09-20: 500 mL via INTRAVENOUS

## 2016-09-20 NOTE — ED Provider Notes (Signed)
[facility] Provider Note   CSN: [id number] Arrival date & time: 09/20/16  1252     History   Chief Complaint Chief Complaint  Patient presents with  . Leg Pain    HPI Jessica Knox is a 50 y.o. female.  HPI 50 year old female who presents with extremity swelling and pain. History of DM, asthma, GERD. Ongoing for 2 weeks where she has noticed increased abdominal distension, LE and UE swelling. Has had pain in bilateral arms and legs diffusely. No abdominal pain, nausea or vomiting, fever or chills, or night sweats. Recently treated for asthma exacerbation in October with course of steroids. States she has been very lethargic and tired. No chest pain or shortness of breath.  During this past 2 weeks, has been speaking to family doctor via telephone regarding issue. Discontinued on her lipitor. Placed on glipizide and increased on metformin dosage recently as well. States history of alcohol abuse, drinking 1 bottle of wine up to 3-4 months ago, now drinking less frequently.  No recent travel.    Past Medical History:  Diagnosis Date  . Diabetes mellitus without complication ([place])   . GERD (gastroesophageal reflux disease)   . Hypertension   . SVT (supraventricular tachycardia) ([place])   . Thyroid disease    multinodular thyroid total thyroidectomy    Patient Active Problem List   Diagnosis Date Noted  . Edema 09/20/2016  . Hypothyroidism 07/30/2016  . Essential hypertension 07/30/2016  . Diabetes mellitus type 2 in obese ([place]) 07/30/2016  . Adjustment disorder with mixed anxiety and depressed mood 07/30/2016  . History of colonic polyps 07/30/2016  . Smoker 01/26/2015  . BMI 28.0-28.9,adult 01/26/2015  . Stress incontinence 01/26/2015  . Adnexal pain 12/17/2014  . Abdominal pain 04/24/2013  . Pancreatitis 04/24/2013    Past Surgical History:  Procedure Laterality Date  . ABDOMINAL HYSTERECTOMY    . CHOLECYSTECTOMY    . RIGHT OOPHORECTOMY  12/06/2012  .  RIGHT OOPHORECTOMY Right   . ROBOTIC ASSISTED DIAGNOSTIC LAPAROSCOPY  01/10/2015  . THYROIDECTOMY      OB History    No data available       Home Medications    Prior to Admission medications   Medication Sig Start Date End Date Taking? Authorizing Provider  albuterol (PROVENTIL HFA;VENTOLIN HFA) 108 (90 Base) MCG/ACT inhaler Inhale 2 puffs into the lungs every 4 (four) hours as needed for wheezing or shortness of breath (cough, shortness of breath or wheezing.). 08/28/16   Chelle Jeffery, PA-C  ALPRAZolam (XANAX) 0.5 MG tablet Take 1 tablet (0.5 mg total) by mouth 2 (two) times daily as needed for anxiety. 09/13/16   Chelle Jeffery, PA-C  aspirin EC 81 MG tablet Take 81 mg by mouth daily.    Historical Provider, MD  atorvastatin (LIPITOR) 20 MG tablet Take 1 tablet (20 mg total) by mouth daily. 08/28/16   Chelle Jeffery, PA-C  benzonatate (TESSALON) 100 MG capsule Take 1 capsule (100 mg total) by mouth every 8 (eight) hours. 08/15/16   Okey Regal, PA-C  Blood Glucose Monitoring Suppl KIT Check blood sugar daily as needed 08/03/16   Chelle Jeffery, PA-C  buPROPion (WELLBUTRIN SR) 150 MG 12 hr tablet Take 1 tablet (150 mg total) by mouth 2 (two) times daily. 08/07/16   Chelle Jeffery, PA-C  CARTIA XT 180 MG 24 hr capsule TAKE 1 CAPSULE DAILY 09/19/16   Chelle Jeffery, PA-C  cetirizine (ZYRTEC) 10 MG tablet Take 10 mg by mouth daily.  Historical Provider, MD  esomeprazole (NEXIUM) 40 MG capsule TAKE 1 CAPSULE DAILY 09/19/16   Chelle Jeffery, PA-C  estradiol (ESTRACE) 1 MG tablet TAKE 1 TABLET DAILY 09/19/16   Chelle Jeffery, PA-C  glipiZIDE (GLUCOTROL) 10 MG tablet Take 1 tablet (10 mg total) by mouth 2 (two) times daily before a meal. 09/12/16   Chelle Jeffery, PA-C  glucose blood test strip Use as instructed 08/28/16   Chelle Jeffery, PA-C  Ipratropium-Albuterol (COMBIVENT) 20-100 MCG/ACT AERS respimat Inhale 1 puff into the lungs every 6 (six) hours. 08/28/16   Chelle Jeffery, PA-C    Lancets (ONETOUCH ULTRASOFT) lancets Test blood sugar daily. 08/17/16   Chelle Jeffery, PA-C  levothyroxine (SYNTHROID, LEVOTHROID) 175 MCG tablet Take 1 tablet (175 mcg total) by mouth daily before breakfast. 01/30/16   Deborah B Gessner, FNP  meloxicam (MOBIC) 15 MG tablet Take 15 mg by mouth daily. 08/28/16   Chelle Jeffery, PA-C  metFORMIN (GLUCOPHAGE) 500 MG tablet Take 2 tablets (1,000 mg total) by mouth 2 (two) times daily with a meal. 08/01/16 10/30/16  Chelle Jeffery, PA-C  metoprolol (LOPRESSOR) 50 MG tablet Take 50 mg by mouth 2 (two) times daily.    Historical Provider, MD  nitrofurantoin (MACRODANTIN) 50 MG capsule TAKE 1 CAPSULE AT BEDTIME 09/19/16   Chelle Jeffery, PA-C  ondansetron (ZOFRAN-ODT) 8 MG disintegrating tablet Take 1 tablet (8 mg total) by mouth every 8 (eight) hours as needed for nausea. 07/19/16   Kristi M Smith, MD  predniSONE (DELTASONE) 20 MG tablet Take 2 tablets (40 mg total) by mouth daily with breakfast. 08/15/16   Jeffrey Hedges, PA-C  spironolactone (ALDACTONE) 50 MG tablet TAKE 1 TABLET DAILY 09/19/16   Chelle Jeffery, PA-C  valsartan-hydrochlorothiazide (DIOVAN-HCT) 160-25 MG tablet Take 1 tablet by mouth daily.    Historical Provider, MD    Family History Family History  Problem Relation Age of Onset  . Adopted: Yes  . Family history unknown: Yes    Social History Social History  Substance Use Topics  . Smoking status: Current Every Day Smoker    Packs/day: 1.00    Years: 30.00    Types: Cigarettes  . Smokeless tobacco: Never Used  . Alcohol use 0.0 oz/week     Allergies   Morphine and related; Prednisone; and Ciprofloxacin   Review of Systems Review of Systems 10/14 systems reviewed and are negative other than those stated in the HPI   Physical Exam Updated Vital Signs BP 115/66   Pulse 90   Temp 97.8 F (36.6 C) (Oral)   Resp 16   SpO2 94%   Physical Exam Physical Exam  Nursing note and vitals reviewed. Constitutional:  Appears listless and lethargic,  in no acute distress Head: Normocephalic and atraumatic.  Mouth/Throat: Oropharynx is clear and dry mucous membranes.  Neck: Normal range of motion. Neck supple.  Cardiovascular: Normal rate and regular rhythm.  +1 edema in bilateral LE up to knee and bilateral  Upper extremities up to elbow. No significant facial edema. Pulmonary/Chest: Effort normal and breath sounds normal.  Abdominal: Soft. Moderate distension. There is no tenderness. There is no rebound and no guarding.  Musculoskeletal: No deformities.  Neurological: Alert, no facial droop, fluent speech, moves all extremities symmetrically Skin: Skin is warm and dry. Patches of erythema over bilateral hands, feet and extensor surfaces of extremities. Psychiatric: Cooperative   ED Treatments / Results  Labs (all labs ordered are listed, but only abnormal results are displayed) Labs Reviewed  CBC WITH DIFFERENTIAL/PLATELET -   Abnormal; Notable for the following:       Result Value   RBC 3.85 (*)    HCT 35.9 (*)    All other components within normal limits  COMPREHENSIVE METABOLIC PANEL - Abnormal; Notable for the following:    Sodium 130 (*)    Chloride 90 (*)    Glucose, Bld 259 (*)    AST 199 (*)    ALT 141 (*)    All other components within normal limits  MAGNESIUM - Abnormal; Notable for the following:    Magnesium 1.6 (*)    All other components within normal limits  SEDIMENTATION RATE - Abnormal; Notable for the following:    Sed Rate 68 (*)    All other components within normal limits  I-STAT CG4 LACTIC ACID, ED - Abnormal; Notable for the following:    Lactic Acid, Venous 2.88 (*)    All other components within normal limits  URINALYSIS, ROUTINE W REFLEX MICROSCOPIC (NOT AT [facility])  TSH  T4, FREE  C-REACTIVE PROTEIN  I-STAT CG4 LACTIC ACID, ED    EKG  EKG Interpretation None       Radiology No results found.  Procedures Procedures (including critical care  time)  Medications Ordered in ED Medications  sodium chloride 0.9 % bolus 500 mL (not administered)  HYDROmorphone (DILAUDID) injection 0.5 mg (0.5 mg Intravenous Given 09/20/16 1450)  magnesium sulfate IVPB 2 g 50 mL (0 g Intravenous Stopped 09/20/16 1640)     Initial Impression / Assessment and Plan / ED Course  I have reviewed the triage vital signs and the nursing notes.  Pertinent labs & imaging results that were available during my care of the patient were reviewed by me and considered in my medical decision making (see chart for details).  Clinical Course     Presenting with 2 weeks of increased abdominal distension, LE and UE edema, lethargy and fatigue, diffuse extremity pain. Vital signs stable on presentation. She appears listless with LE and UE edema in bilateral upper and lower extremities and abdominal distension. There is erythematous patches involving the extensor surfaces of her bilateral upper and lower extremities.  Unclear etiology. Question potential steroid side effect but has tolerated steroids w/o difficulty in the past. Question other medication effect or new autoimmune type presenation. Blood work with mild hyponatremia/hypochloremia and hypomagnesemia. Increased transaminitis from baseline.  Has been trying to follow-up with PCP via telephone regarding this issue. Discussed with Dr. Marily Memos. Plan to admit for observation for further w/u.  Final Clinical Impressions(s) / ED Diagnoses   Final diagnoses:  Transaminitis  Edema, unspecified type    New Prescriptions New Prescriptions   No medications on file     Forde Dandy, MD 09/20/16 1658

## 2016-09-20 NOTE — ED Notes (Signed)
Husband will like to be the emergency contact, name Jessica Knox, [phone].

## 2016-09-20 NOTE — ED Notes (Signed)
Had to call [facility] back.  Second, put me on hold--Doug.  Will call back again later.

## 2016-09-20 NOTE — ED Triage Notes (Signed)
Pain, redness and swelling to her arms and legs. Symptoms started 2 weeks ago.

## 2016-09-20 NOTE — ED Notes (Signed)
ED Provider at bedside. 

## 2016-09-20 NOTE — ED Notes (Signed)
ED Provider at bedside. Dr. Verdie MosherLiu

## 2016-09-20 NOTE — ED Notes (Signed)
Ambulating to bathroom with assistance.

## 2016-09-20 NOTE — ED Notes (Signed)
[facility]

## 2016-09-21 ENCOUNTER — Encounter (HOSPITAL_COMMUNITY): Payer: Self-pay | Admitting: Internal Medicine

## 2016-09-21 ENCOUNTER — Observation Stay (HOSPITAL_COMMUNITY): Payer: BLUE CROSS/BLUE SHIELD

## 2016-09-21 ENCOUNTER — Ambulatory Visit: Payer: BLUE CROSS/BLUE SHIELD

## 2016-09-21 DIAGNOSIS — R21 Rash and other nonspecific skin eruption: Secondary | ICD-10-CM | POA: Diagnosis present

## 2016-09-21 DIAGNOSIS — R7401 Elevation of levels of liver transaminase levels: Secondary | ICD-10-CM

## 2016-09-21 DIAGNOSIS — R740 Nonspecific elevation of levels of transaminase and lactic acid dehydrogenase [LDH]: Secondary | ICD-10-CM | POA: Diagnosis not present

## 2016-09-21 DIAGNOSIS — R609 Edema, unspecified: Secondary | ICD-10-CM

## 2016-09-21 HISTORY — DX: Elevation of levels of liver transaminase levels: R74.01

## 2016-09-21 LAB — GLUCOSE, CAPILLARY
GLUCOSE-CAPILLARY: 211 mg/dL — AB (ref 65–99)
GLUCOSE-CAPILLARY: 182 mg/dL — AB (ref 65–99)
GLUCOSE-CAPILLARY: 165 mg/dL — AB (ref 65–99)
Glucose-Capillary: 163 mg/dL — ABNORMAL HIGH (ref 65–99)

## 2016-09-21 LAB — BASIC METABOLIC PANEL
Anion gap: 16 — ABNORMAL HIGH (ref 5–15)
BUN: 6 mg/dL (ref 6–20)
CO2: 24 mmol/L (ref 22–32)
CREATININE: 0.68 mg/dL (ref 0.44–1.00)
Calcium: 8.5 mg/dL — ABNORMAL LOW (ref 8.9–10.3)
Chloride: 91 mmol/L — ABNORMAL LOW (ref 101–111)
GFR calc Af Amer: >60 mL/min (ref >60)
Glucose, Bld: 190 mg/dL — ABNORMAL HIGH (ref 65–99)
Potassium: 3.6 mmol/L (ref 3.5–5.1)
SODIUM: 131 mmol/L — AB (ref 135–145)

## 2016-09-21 LAB — CBC WITH DIFFERENTIAL/PLATELET
Basophils Absolute: 0 10*3/uL (ref 0.0–0.1)
Basophils Relative: 0 %
EOS ABS: 0.3 10*3/uL (ref 0.0–0.7)
EOS PCT: 3 %
HCT: 36.8 % (ref 36.0–46.0)
Hemoglobin: 12.3 g/dL (ref 12.0–15.0)
LYMPHS ABS: 1.4 10*3/uL (ref 0.7–4.0)
Lymphocytes Relative: 14 %
MCH: 31.2 pg (ref 26.0–34.0)
MCHC: 33.4 g/dL (ref 30.0–36.0)
MCV: 93.4 fL (ref 78.0–100.0)
MONO ABS: 0.8 10*3/uL (ref 0.1–1.0)
MONOS PCT: 8 %
Neutro Abs: 7.2 10*3/uL (ref 1.7–7.7)
Neutrophils Relative %: 75 %
PLATELETS: 279 10*3/uL (ref 150–400)
RBC: 3.94 MIL/uL (ref 3.87–5.11)
RDW: 13.1 % (ref 11.5–15.5)
WBC: 9.7 10*3/uL (ref 4.0–10.5)

## 2016-09-21 LAB — HEPATIC FUNCTION PANEL
ALK PHOS: 109 U/L (ref 38–126)
ALT: 127 U/L — AB (ref 14–54)
AST: 203 U/L — ABNORMAL HIGH (ref 15–41)
Albumin: 4 g/dL (ref 3.5–5.0)
BILIRUBIN TOTAL: 0.8 mg/dL (ref 0.3–1.2)
Bilirubin, Direct: 0.2 mg/dL (ref 0.1–0.5)
Indirect Bilirubin: 0.6 mg/dL (ref 0.3–0.9)
Total Protein: 6.9 g/dL (ref 6.5–8.1)

## 2016-09-21 LAB — CK: Total CK: 9149 U/L — ABNORMAL HIGH (ref 38–234)

## 2016-09-21 LAB — RAPID URINE DRUG SCREEN, HOSP PERFORMED
Amphetamines: NOT DETECTED
Barbiturates: NOT DETECTED
Benzodiazepines: POSITIVE — AB
Cocaine: NOT DETECTED
OPIATES: POSITIVE — AB
Tetrahydrocannabinol: NOT DETECTED

## 2016-09-21 LAB — MRSA PCR SCREENING: MRSA BY PCR: NEGATIVE

## 2016-09-21 LAB — HIV ANTIBODY (ROUTINE TESTING W REFLEX): HIV SCREEN 4TH GENERATION: NONREACTIVE

## 2016-09-21 LAB — LACTIC ACID, PLASMA: LACTIC ACID, VENOUS: 2.1 mmol/L — AB (ref 0.5–1.9)

## 2016-09-21 MED ORDER — METOPROLOL TARTRATE 50 MG PO TABS
50.0000 mg | ORAL_TABLET | Freq: Two times a day (BID) | ORAL | Status: DC
  Administered 2016-09-21 – 2016-09-27 (×12): 50 mg via ORAL
  Filled 2016-09-21 (×13): qty 1

## 2016-09-21 MED ORDER — CLINDAMYCIN PHOSPHATE 600 MG/50ML IV SOLN
600.0000 mg | Freq: Three times a day (TID) | INTRAVENOUS | Status: DC
  Administered 2016-09-21 – 2016-09-26 (×16): 600 mg via INTRAVENOUS
  Filled 2016-09-21 (×19): qty 50

## 2016-09-21 MED ORDER — LEVOTHYROXINE SODIUM 100 MCG IV SOLR
100.0000 ug | Freq: Every day | INTRAVENOUS | Status: DC
  Administered 2016-09-22: 100 ug via INTRAVENOUS
  Filled 2016-09-21: qty 5

## 2016-09-21 MED ORDER — IRBESARTAN 150 MG PO TABS
150.0000 mg | ORAL_TABLET | Freq: Every day | ORAL | Status: DC
  Administered 2016-09-21 – 2016-09-27 (×6): 150 mg via ORAL
  Filled 2016-09-21 (×6): qty 1

## 2016-09-21 MED ORDER — DILTIAZEM HCL ER COATED BEADS 180 MG PO CP24
180.0000 mg | ORAL_CAPSULE | Freq: Every day | ORAL | Status: DC
  Administered 2016-09-21 – 2016-09-27 (×6): 180 mg via ORAL
  Filled 2016-09-21 (×7): qty 1

## 2016-09-21 MED ORDER — IOPAMIDOL (ISOVUE-300) INJECTION 61%
INTRAVENOUS | Status: AC
  Administered 2016-09-21: 100 mL
  Filled 2016-09-21: qty 100

## 2016-09-21 MED ORDER — BENZONATATE 100 MG PO CAPS
100.0000 mg | ORAL_CAPSULE | Freq: Three times a day (TID) | ORAL | Status: DC
  Administered 2016-09-21 – 2016-09-25 (×7): 100 mg via ORAL
  Filled 2016-09-21 (×11): qty 1

## 2016-09-21 MED ORDER — BUPROPION HCL ER (SR) 150 MG PO TB12
150.0000 mg | ORAL_TABLET | Freq: Two times a day (BID) | ORAL | Status: DC
  Administered 2016-09-21 – 2016-09-27 (×13): 150 mg via ORAL
  Filled 2016-09-21 (×13): qty 1

## 2016-09-21 MED ORDER — HEPARIN SODIUM (PORCINE) 5000 UNIT/ML IJ SOLN
5000.0000 [IU] | Freq: Three times a day (TID) | INTRAMUSCULAR | Status: DC
  Administered 2016-09-21 – 2016-09-22 (×4): 5000 [IU] via SUBCUTANEOUS
  Filled 2016-09-21 (×4): qty 1

## 2016-09-21 MED ORDER — OXYCODONE-ACETAMINOPHEN 5-325 MG PO TABS
1.0000 | ORAL_TABLET | ORAL | Status: DC | PRN
  Administered 2016-09-21 – 2016-09-23 (×7): 1 via ORAL
  Filled 2016-09-21 (×7): qty 1

## 2016-09-21 MED ORDER — PANTOPRAZOLE SODIUM 40 MG PO TBEC
40.0000 mg | DELAYED_RELEASE_TABLET | Freq: Every day | ORAL | Status: DC
  Administered 2016-09-21 – 2016-09-27 (×7): 40 mg via ORAL
  Filled 2016-09-21 (×6): qty 1

## 2016-09-21 MED ORDER — INSULIN ASPART 100 UNIT/ML ~~LOC~~ SOLN
0.0000 [IU] | Freq: Three times a day (TID) | SUBCUTANEOUS | Status: DC
  Administered 2016-09-21: 2 [IU] via SUBCUTANEOUS
  Administered 2016-09-21: 3 [IU] via SUBCUTANEOUS
  Administered 2016-09-22 (×3): 2 [IU] via SUBCUTANEOUS
  Administered 2016-09-23: 1 [IU] via SUBCUTANEOUS
  Administered 2016-09-23: 3 [IU] via SUBCUTANEOUS
  Administered 2016-09-23 – 2016-09-24 (×3): 2 [IU] via SUBCUTANEOUS
  Administered 2016-09-26: 1 [IU] via SUBCUTANEOUS
  Administered 2016-09-26: 2 [IU] via SUBCUTANEOUS

## 2016-09-21 MED ORDER — HYDRALAZINE HCL 20 MG/ML IJ SOLN: 10.0000 mg | INTRAMUSCULAR | Status: DC | PRN

## 2016-09-21 MED ORDER — HYDROMORPHONE HCL 2 MG/ML IJ SOLN
0.5000 mg | INTRAMUSCULAR | Status: DC | PRN
  Administered 2016-09-21 (×2): 0.5 mg via INTRAVENOUS
  Filled 2016-09-21 (×2): qty 1

## 2016-09-21 MED ORDER — HYDROMORPHONE HCL 2 MG/ML IJ SOLN
0.5000 mg | INTRAMUSCULAR | Status: DC | PRN
  Administered 2016-09-21 – 2016-09-23 (×9): 0.5 mg via INTRAVENOUS
  Filled 2016-09-21 (×9): qty 1

## 2016-09-21 MED ORDER — ONDANSETRON HCL 4 MG PO TABS: 4.0000 mg | ORAL_TABLET | Freq: Four times a day (QID) | ORAL | Status: DC | PRN

## 2016-09-21 MED ORDER — LEVOTHYROXINE SODIUM 100 MCG IV SOLR
87.5000 ug | Freq: Every day | INTRAVENOUS | Status: DC
  Filled 2016-09-21: qty 5

## 2016-09-21 MED ORDER — ALPRAZOLAM 0.5 MG PO TABS
0.5000 mg | ORAL_TABLET | Freq: Two times a day (BID) | ORAL | Status: DC | PRN
  Administered 2016-09-21 – 2016-09-26 (×8): 0.5 mg via ORAL
  Filled 2016-09-21 (×8): qty 1

## 2016-09-21 MED ORDER — ALBUTEROL SULFATE (2.5 MG/3ML) 0.083% IN NEBU: 3.0000 mL | INHALATION_SOLUTION | RESPIRATORY_TRACT | Status: DC | PRN

## 2016-09-21 MED ORDER — IPRATROPIUM-ALBUTEROL 0.5-2.5 (3) MG/3ML IN SOLN: 3.0000 mL | Freq: Four times a day (QID) | RESPIRATORY_TRACT | Status: DC

## 2016-09-21 MED ORDER — ONDANSETRON HCL 4 MG/2ML IJ SOLN
4.0000 mg | Freq: Four times a day (QID) | INTRAMUSCULAR | Status: DC | PRN
  Filled 2016-09-21: qty 2

## 2016-09-21 NOTE — H&P (Signed)
History and Physical    Amrutha Avera TTS:[id number] DOB: [birth year] DOA: 09/20/2016  PCP: Harrison Mons, PA-C  Patient coming from: Home.  Chief Complaint: Peripheral edema and abdominal distention.  HPI: Jessica Knox is a 50 y.o. female with hypertension, SVT, diabetes mellitus type 2, hyperlipidemia and hypothyroidism has been experiencing increasing peripheral edema involving the upper and lower extremity and abdominal distention. Patient also has been having severe pain all extremities. Patient has been recently placed on Lipitor last month which patient discontinued after the onset of above symptoms 2 weeks ago. Patient's metformin dose was doubled last month. Denies any nausea vomiting. Denies any chest pain or shortness of breath. Has been having some diarrhea off and on. Denies any recent travel or insect bites.  In the ER patient's labs revealed elevated LFTs with elevated CRP and sedimentation rate and on exam patient has rash involving upper and lower extremities. Patient's lactate level was elevated and was given 500 mL bolus followed which lactate improved. UA does not show any proteinuria and albumin is normal in the blood work.  Patient states she used to drink a lot of alcohol but off recently has been only drinking rarely.  Patient also was recently placed on prednisone for asthma.  ED Course: See history of presenting illness.  Review of Systems: As per HPI, rest all negative.   Past Medical History:  Diagnosis Date  . Diabetes mellitus without complication ([place])   . GERD (gastroesophageal reflux disease)   . Hypertension   . SVT (supraventricular tachycardia) ([place])   . Thyroid disease    multinodular thyroid total thyroidectomy    Past Surgical History:  Procedure Laterality Date  . ABDOMINAL HYSTERECTOMY    . CHOLECYSTECTOMY    . RIGHT OOPHORECTOMY  12/06/2012  . RIGHT OOPHORECTOMY Right   . ROBOTIC ASSISTED DIAGNOSTIC LAPAROSCOPY  01/10/2015  .  THYROIDECTOMY       reports that she has been smoking Cigarettes.  She has a 30.00 pack-year smoking history. She has never used smokeless tobacco. She reports that she drinks alcohol. She reports that she does not use drugs.  Allergies  Allergen Reactions  . Morphine And Related Itching  . Prednisone   . Ciprofloxacin Palpitations    Family History  Problem Relation Age of Onset  . Adopted: Yes  . Family history unknown: Yes    Prior to Admission medications   Medication Sig Start Date End Date Taking? Authorizing Provider  albuterol (PROVENTIL HFA;VENTOLIN HFA) 108 (90 Base) MCG/ACT inhaler Inhale 2 puffs into the lungs every 4 (four) hours as needed for wheezing or shortness of breath (cough, shortness of breath or wheezing.). 08/28/16   Chelle Jeffery, PA-C  ALPRAZolam (XANAX) 0.5 MG tablet Take 1 tablet (0.5 mg total) by mouth 2 (two) times daily as needed for anxiety. 09/13/16   Chelle Jeffery, PA-C  aspirin EC 81 MG tablet Take 81 mg by mouth daily.    Historical Provider, MD  atorvastatin (LIPITOR) 20 MG tablet Take 1 tablet (20 mg total) by mouth daily. 08/28/16   Chelle Jeffery, PA-C  benzonatate (TESSALON) 100 MG capsule Take 1 capsule (100 mg total) by mouth every 8 (eight) hours. 08/15/16   Okey Regal, PA-C  Blood Glucose Monitoring Suppl KIT Check blood sugar daily as needed 08/03/16   Chelle Jeffery, PA-C  buPROPion (WELLBUTRIN SR) 150 MG 12 hr tablet Take 1 tablet (150 mg total) by mouth 2 (two) times daily. 08/07/16   Harrison Mons, PA-C  CARTIA XT  180 MG 24 hr capsule TAKE 1 CAPSULE DAILY 09/19/16   Chelle Jeffery, PA-C  cetirizine (ZYRTEC) 10 MG tablet Take 10 mg by mouth daily.    Historical Provider, MD  esomeprazole (NEXIUM) 40 MG capsule TAKE 1 CAPSULE DAILY 09/19/16   Harrison Mons, PA-C  estradiol (ESTRACE) 1 MG tablet TAKE 1 TABLET DAILY 09/19/16   Chelle Jeffery, PA-C  glipiZIDE (GLUCOTROL) 10 MG tablet Take 1 tablet (10 mg total) by mouth 2 (two) times  daily before a meal. 09/12/16   Chelle Jeffery, PA-C  glucose blood test strip Use as instructed 08/28/16   Chelle Jeffery, PA-C  Ipratropium-Albuterol (COMBIVENT) 20-100 MCG/ACT AERS respimat Inhale 1 puff into the lungs every 6 (six) hours. 08/28/16   Chelle Jeffery, PA-C  Lancets (ONETOUCH ULTRASOFT) lancets Test blood sugar daily. 08/17/16   Chelle Jeffery, PA-C  levothyroxine (SYNTHROID, LEVOTHROID) 175 MCG tablet Take 1 tablet (175 mcg total) by mouth daily before breakfast. 01/30/16   Elby Beck, FNP  meloxicam (MOBIC) 15 MG tablet Take 15 mg by mouth daily. 08/28/16   Chelle Jeffery, PA-C  metFORMIN (GLUCOPHAGE) 500 MG tablet Take 2 tablets (1,000 mg total) by mouth 2 (two) times daily with a meal. 08/01/16 10/30/16  Chelle Jeffery, PA-C  metoprolol (LOPRESSOR) 50 MG tablet Take 50 mg by mouth 2 (two) times daily.    Historical Provider, MD  nitrofurantoin (MACRODANTIN) 50 MG capsule TAKE 1 CAPSULE AT BEDTIME 09/19/16   Chelle Jeffery, PA-C  ondansetron (ZOFRAN-ODT) 8 MG disintegrating tablet Take 1 tablet (8 mg total) by mouth every 8 (eight) hours as needed for nausea. 07/19/16   Wardell Honour, MD  predniSONE (DELTASONE) 20 MG tablet Take 2 tablets (40 mg total) by mouth daily with breakfast. 08/15/16   Okey Regal, PA-C  spironolactone (ALDACTONE) 50 MG tablet TAKE 1 TABLET DAILY 09/19/16   Chelle Jeffery, PA-C  valsartan-hydrochlorothiazide (DIOVAN-HCT) 160-25 MG tablet Take 1 tablet by mouth daily.    Historical Provider, MD    Physical Exam: Vitals:   09/20/16 2120 09/20/16 2222 09/21/16 0008 09/21/16 0050  BP: 117/62 119/68  132/75  Pulse: 89 89  98  Resp: '18 19  17  ' Temp:  98.3 F (36.8 C)  97.7 F (36.5 C)  TempSrc:    Oral  SpO2: 96% 98%  97%  Weight:   74.8 kg (165 lb)   Height:   5' (1.524 m)       Constitutional: Moderately built and nourished. Vitals:   09/20/16 2120 09/20/16 2222 09/21/16 0008 09/21/16 0050  BP: 117/62 119/68  132/75  Pulse: 89 89  98   Resp: '18 19  17  ' Temp:  98.3 F (36.8 C)  97.7 F (36.5 C)  TempSrc:    Oral  SpO2: 96% 98%  97%  Weight:   74.8 kg (165 lb)   Height:   5' (1.524 m)    Eyes: Anicteric without pallor. ENMT: No discharge from the ears eyes nose and mouth. Neck: No neck rigidity no mass felt. No JVD appreciated. Respiratory: No rhonchi or crepitations. Cardiovascular: S1 and S2 heard no murmurs appreciated. Abdomen: Distended abdomen nontender. Bowel sounds present. No guarding or rigidity. Musculoskeletal: Edema of the upper and lower extremity. Skin: Rash involving the upper and lower extremity. Neurologic: Alert awake oriented to time place and person. Moves all extremities. Psychiatric: Appears normal. Normal affect.   Labs on Admission: I have personally reviewed following labs and imaging studies  CBC:  Recent Labs Lab 09/20/16  1411  WBC 9.9  NEUTROABS 7.7  HGB 12.0  HCT 35.9*  MCV 93.2  PLT 831   Basic Metabolic Panel:  Recent Labs Lab 09/20/16 1411  NA 130*  K 3.7  CL 90*  CO2 27  GLUCOSE 259*  BUN 9  CREATININE 0.68  CALCIUM 9.3  MG 1.6*   GFR: Estimated Creatinine Clearance: 77.7 mL/min (by C-G formula based on SCr of 0.68 mg/dL). Liver Function Tests:  Recent Labs Lab 09/20/16 1411  AST 199*  ALT 141*  ALKPHOS 118  BILITOT 0.8  PROT 7.8  ALBUMIN 4.3   No results for input(s): LIPASE, AMYLASE in the last 168 hours. No results for input(s): AMMONIA in the last 168 hours. Coagulation Profile: No results for input(s): INR, PROTIME in the last 168 hours. Cardiac Enzymes: No results for input(s): CKTOTAL, CKMB, CKMBINDEX, TROPONINI in the last 168 hours. BNP (last 3 results) No results for input(s): PROBNP in the last 8760 hours. HbA1C: No results for input(s): HGBA1C in the last 72 hours. CBG: No results for input(s): GLUCAP in the last 168 hours. Lipid Profile: No results for input(s): CHOL, HDL, LDLCALC, TRIG, CHOLHDL, LDLDIRECT in the last 72  hours. Thyroid Function Tests:  Recent Labs  09/20/16 1411  TSH 52.086*  FREET4 0.31*   Anemia Panel: No results for input(s): VITAMINB12, FOLATE, FERRITIN, TIBC, IRON, RETICCTPCT in the last 72 hours. Urine analysis:    Component Value Date/Time   COLORURINE YELLOW 09/20/2016 1728   APPEARANCEUR CLEAR 09/20/2016 1728   LABSPEC 1.007 09/20/2016 1728   PHURINE 6.5 09/20/2016 1728   GLUCOSEU 100 (A) 09/20/2016 1728   HGBUR NEGATIVE 09/20/2016 1728   BILIRUBINUR NEGATIVE 09/20/2016 1728   BILIRUBINUR negative 07/30/2016 1734   BILIRUBINUR Moderate 06/20/2015 1257   KETONESUR NEGATIVE 09/20/2016 1728   PROTEINUR NEGATIVE 09/20/2016 1728   UROBILINOGEN 0.2 07/30/2016 1734   NITRITE NEGATIVE 09/20/2016 1728   LEUKOCYTESUR TRACE (A) 09/20/2016 1728   Sepsis Labs: '@LABRCNTIP' (procalcitonin:4,lacticidven:4) )No results found for this or any previous visit (from the past 240 hour(s)).   Radiological Exams on Admission: No results found.   Assessment/Plan Active Problems:   Hypothyroidism   Essential hypertension   Diabetes mellitus type 2 in obese ([facility])   Edema   Rash   Transaminitis    1. Peripheral edema with abdominal distention, elevated LFTs and rash - cause not clear. Given the elevated CRP and sedimentation rate, inflammatory process/rheumatological cause to be considered. At this time I have ordered CT abdomen and pelvis to rule out any intra-abdominal cause for the abdominal distention and elevated LFTs. Will check ANA, anti-double-stranded DNA, rheumatoid factor. Follow LFTs closely. Check acute hepatitis panel. Patient is placed on pain relief medications for now. Patient probably will need diuretics. Check CK levels due to pain. TSH is markedly elevated see #4. 2. Hypertension - continue Diovan. Will hold off hydrochlorothiazide and spironolactone for now. When necessary IV hydralazine for systolic blood pressure more than 160. 3. Diabetes mellitus type 2 - hold oral  hypoglycemics and I have placed patient on sliding scale coverage. 4. Hypothyroidism - TSH is markedly elevated. Patient states she has been compliant with her Synthroid. I have placed patient on IV Synthroid for now. 5. History of SVT on Cardizem. 6. History of asthma - not wheezing.   DVT prophylaxis: Heparin.  Code Status: Full code.  Family Communication: Discussed with patient.  Disposition Plan: Home.  Consults called: None.  Admission status: Observation.    Shylie Polo N. MD  [facility] Pager [phone].  If 7PM-7AM, please contact night-coverage [email or web address] Password [email or web address]  09/21/2016, 2:18 AM

## 2016-09-21 NOTE — Progress Notes (Addendum)
PROGRESS NOTE    Jessica Knox  ZOX:[id number] DOB: [birth year] DOA: 09/20/2016 PCP: Porfirio Oar, PA-C   Brief Narrative: 50 y.o. female with hypertension, SVT, diabetes mellitus type 2, hyperlipidemia and hypothyroidism has been experiencing increasing peripheral edema involving the upper and lower extremity and abdominal distention. Patient also has been having severe pain all extremities. Patient has been recently placed on Lipitor last month which patient discontinued after the onset of above symptoms 2 weeks ago. Patient's metformin dose was doubled last month.  Assessment & Plan:   # Peripheral edema, erythematous rash and elevated LFTs (transaminitis): Exact etiology unknown. -Patient also has elevated CK level of 9149, elevated TSH level.  -Transaminitis may be caused by statin versus her use of alcohol. Elevated CK level may be caused by hypothyroidism or statin. -Patient had elevated lactic acid on admission. Also has elevated CRP level. -HIV negative. -Acute hepatitis panel, anti-DNA antibody, rheumatoid factor, ANA pending. -Urine toxic positive for opiates and benzodiazepine which is probably because of her home medications. -Given elevated lactic acid level, erythematous rashes: I will start empiric antibiotics for possible cellulitis. I am planning to narrow down or discontinue antibiotics when I have tests result. -Doppler ultrasound ordered to rule out DVT. -Pain medications adjusted.  # Abdomen distention: CT abdomen pelvis showed mild hepatomegaly with hepatic steatosis. No acute inflammatory changes.  # Nontraumatic rhabdomyolysis: Exact etiology unknown. May related with recent use of statin. Patient has normal kidney function. We will not start IV fluid today because of extremities edema, pending Doppler ultrasound. Repeat CK in the morning.  # Hypothyroidism: TSH level mildly elevated to 52 with low free T4. Currently receiving IV Synthroid. May change to oral dose  tomorrow. Patient reported taking medication regularly at home. I will increase the dose.  # Essential hypertension: Blood pressure acceptable. Continue current medications.    #Type 2 diabetes in obese patient: Continue sliding scale.  #History of supraventricular tachycardia: On Cardizem and metoprolol.  DVT prophylaxis: On heparin subcutaneous Code Status: Full code Family Communication: No family present at bedside Disposition Plan: Likely discharge home in 1-2 days depending on clinical improvement.  Consultants:   None  Procedures: None Antimicrobials: Starting vancomycin today.  Subjective: Patient was seen and examined at bedside. Patient was complaining of generalized body pain especially on upper and lower extremities. Feels weak. Denies nausea vomiting chest pain or shortness of breath. No coughing. No headache.   Objective: Vitals:   09/21/16 0008 09/21/16 0050 09/21/16 0500 09/21/16 1343  BP:  132/75 118/70 113/71  Pulse:  98 96 79  Resp:  17 16 16   Temp:  97.7 F (36.5 C) 97.8 F (36.6 C) 98.2 F (36.8 C)  TempSrc:  Oral Oral   SpO2:  97% 93% 96%  Weight: 74.8 kg (165 lb)  75.1 kg (165 lb 9 oz)   Height: 5' (1.524 m)       Intake/Output Summary (Last 24 hours) at 09/21/16 1609 Last data filed at 09/21/16 1300  Gross per 24 hour  Intake             1030 ml  Output                0 ml  Net             1030 ml   Filed Weights   09/21/16 0008 09/21/16 0500  Weight: 74.8 kg (165 lb) 75.1 kg (165 lb 9 oz)    Examination:  General exam: Appears calm and  comfortable  Respiratory system: Clear to auscultation. Respiratory effort normal. No wheezing or crackle Cardiovascular system: S1 & S2 heard, RRR.   Gastrointestinal system: Abdomen is Soft, nontender. Distended+, BS+ Central nervous system: Alert, awake, non -focal neuro exam Extremities: Bilateral upper and lower extremity erythematous rash, swelling, warmth. Skin: erythematous macular rash on  upper and lower extremities. Psychiatry: Judgement and insight appear normal. Mood & affect appropriate.     Data Reviewed: I have personally reviewed following labs and imaging studies  CBC:  Recent Labs Lab 09/20/16 1411 09/21/16 0524  WBC 9.9 9.7  NEUTROABS 7.7 7.2  HGB 12.0 12.3  HCT 35.9* 36.8  MCV 93.2 93.4  PLT 291 279   Basic Metabolic Panel:  Recent Labs Lab 09/20/16 1411 09/21/16 0524  NA 130* 131*  K 3.7 3.6  CL 90* 91*  CO2 27 24  GLUCOSE 259* 190*  BUN 9 6  CREATININE 0.68 0.68  CALCIUM 9.3 8.5*  MG 1.6*  --    GFR: Estimated Creatinine Clearance: 77.8 mL/min (by C-G formula based on SCr of 0.68 mg/dL). Liver Function Tests:  Recent Labs Lab 09/20/16 1411 09/21/16 0524  AST 199* 203*  ALT 141* 127*  ALKPHOS 118 109  BILITOT 0.8 0.8  PROT 7.8 6.9  ALBUMIN 4.3 4.0   No results for input(s): LIPASE, AMYLASE in the last 168 hours. No results for input(s): AMMONIA in the last 168 hours. Coagulation Profile: No results for input(s): INR, PROTIME in the last 168 hours. Cardiac Enzymes:  Recent Labs Lab 09/21/16 0524  CKTOTAL 9,149*   BNP (last 3 results) No results for input(s): PROBNP in the last 8760 hours. HbA1C: No results for input(s): HGBA1C in the last 72 hours. CBG:  Recent Labs Lab 09/21/16 0532 09/21/16 1406  GLUCAP 211* 163*   Lipid Profile: No results for input(s): CHOL, HDL, LDLCALC, TRIG, CHOLHDL, LDLDIRECT in the last 72 hours. Thyroid Function Tests:  Recent Labs  09/20/16 1411  TSH 52.086*  FREET4 0.31*   Anemia Panel: No results for input(s): VITAMINB12, FOLATE, FERRITIN, TIBC, IRON, RETICCTPCT in the last 72 hours. Sepsis Labs:  Recent Labs Lab 09/20/16 1432 09/20/16 1822 09/21/16 0524  LATICACIDVEN 2.88* 1.81 2.1*    Recent Results (from the past 240 hour(s))  MRSA PCR Screening     Status: None   Collection Time: 09/21/16  1:22 AM  Result Value Ref Range Status   MRSA by PCR NEGATIVE  NEGATIVE Final    Comment:        The GeneXpert MRSA Assay (FDA approved for NASAL specimens only), is one component of a comprehensive MRSA colonization surveillance program. It is not intended to diagnose MRSA infection nor to guide or monitor treatment for MRSA infections.          Radiology Studies: Ct Abdomen Pelvis W Contrast  Result Date: 09/21/2016 CLINICAL DATA:  50 year old with abdominal pain. History of right oophorectomy, cholecystectomy and hysterectomy. EXAM: CT ABDOMEN AND PELVIS WITH CONTRAST TECHNIQUE: Multidetector CT imaging of the abdomen and pelvis was performed using the standard protocol following bolus administration of intravenous contrast. CONTRAST:  100mL ISOVUE-300 IOPAMIDOL (ISOVUE-300) INJECTION 61% COMPARISON:  01/26/2015 FINDINGS: Lower chest: Bandlike density at the left lung base is most suggestive for subsegmental atelectasis. No significant pleural effusions. Hepatobiliary: Diffuse low-density in the liver parenchyma is compatible with hepatic steatosis. Focal fat along the medial right hepatic lobe on sequence 2, image 35. Portal venous system and hepatic veins are patent. The gallbladder has been  removed without biliary dilatation. There also appears to be new focal fat along the anterior aspect of the caudate lobe that measures 3.2 x 1.6 cm. There is a vessel extending directly through this focal low-density area in the caudate which is suggestive for focal fat. Liver is also large for size measuring 20 cm in the craniocaudal dimension. Pancreas: Normal appearance of the pancreas without inflammation or duct dilatation. Spleen: Normal appearance of spleen without enlargement. Adrenals/Urinary Tract: Normal adrenal glands. Urinary bladder is decompressed unremarkable. Normal appearance of both kidneys. Stomach/Bowel: Stomach is within normal limits. Appendix appears normal. No evidence of bowel wall thickening, distention, or inflammatory changes.  Vascular/Lymphatic: Minimal atherosclerotic disease in the distal abdominal aorta. There is no evidence for aortic aneurysm. No significant lymph node enlargement in the abdomen or pelvis. Reproductive: Status post hysterectomy. No adnexal masses. Again noted are small low-density structures in the vagina region. The largest is on the right side measuring 1.3 x 0.4 cm. This structure has decreased in size and previously measured 2.0 x 1.0 cm. These are probably incidental findings especially since they are getting smaller but could represent Gartner duct cysts based on the location. Other: No free fluid.  No free air. Musculoskeletal: Disc space narrowing at L4-L5 and L5-S1. There is a central disc protrusion at L5-S1. IMPRESSION: Mild hepatomegaly with hepatic steatosis. Areas of focal fat in the liver, most prominent in the caudate lobe. No acute inflammatory changes in the abdomen or pelvis. Disc space disease in lower lumbar spine. Electronically Signed   By: Richarda OverlieAdam  Henn M.D.   On: 09/21/2016 09:58        Scheduled Meds: . benzonatate  100 mg Oral Q8H  . buPROPion  150 mg Oral BID  . diltiazem  180 mg Oral Daily  . heparin  5,000 Units Subcutaneous Q8H  . insulin aspart  0-9 Units Subcutaneous TID WC  . irbesartan  150 mg Oral Daily  . levothyroxine  87.5 mcg Intravenous QAC breakfast  . metoprolol  50 mg Oral BID  . pantoprazole  40 mg Oral Daily   Continuous Infusions:   LOS: 0 days    Time spent: 35 minutes.    Dron Jaynie CollinsPrasad Bhandari, MD [facility] Pager [phone]  If 7PM-7AM, please contact night-coverage [email or web address] Password [email or web address] 09/21/2016, 4:09 PM

## 2016-09-21 NOTE — Progress Notes (Signed)
Patient refuses to wear slip-resistant socks and does not call for help when getting out of bed. Patient educated. Will continue to monitor patient.

## 2016-09-22 ENCOUNTER — Inpatient Hospital Stay (HOSPITAL_COMMUNITY): Payer: BLUE CROSS/BLUE SHIELD

## 2016-09-22 DIAGNOSIS — F17210 Nicotine dependence, cigarettes, uncomplicated: Secondary | ICD-10-CM | POA: Diagnosis present

## 2016-09-22 DIAGNOSIS — E669 Obesity, unspecified: Secondary | ICD-10-CM | POA: Diagnosis present

## 2016-09-22 DIAGNOSIS — R21 Rash and other nonspecific skin eruption: Secondary | ICD-10-CM | POA: Diagnosis present

## 2016-09-22 DIAGNOSIS — E039 Hypothyroidism, unspecified: Secondary | ICD-10-CM | POA: Diagnosis present

## 2016-09-22 DIAGNOSIS — D649 Anemia, unspecified: Secondary | ICD-10-CM | POA: Diagnosis present

## 2016-09-22 DIAGNOSIS — E876 Hypokalemia: Secondary | ICD-10-CM | POA: Diagnosis not present

## 2016-09-22 DIAGNOSIS — M7989 Other specified soft tissue disorders: Secondary | ICD-10-CM | POA: Diagnosis not present

## 2016-09-22 DIAGNOSIS — M79A21 Nontraumatic compartment syndrome of right lower extremity: Secondary | ICD-10-CM | POA: Diagnosis present

## 2016-09-22 DIAGNOSIS — I10 Essential (primary) hypertension: Secondary | ICD-10-CM | POA: Diagnosis present

## 2016-09-22 DIAGNOSIS — M25673 Stiffness of unspecified ankle, not elsewhere classified: Secondary | ICD-10-CM | POA: Diagnosis not present

## 2016-09-22 DIAGNOSIS — R262 Difficulty in walking, not elsewhere classified: Secondary | ICD-10-CM | POA: Diagnosis not present

## 2016-09-22 DIAGNOSIS — J45909 Unspecified asthma, uncomplicated: Secondary | ICD-10-CM | POA: Diagnosis present

## 2016-09-22 DIAGNOSIS — K219 Gastro-esophageal reflux disease without esophagitis: Secondary | ICD-10-CM | POA: Diagnosis present

## 2016-09-22 DIAGNOSIS — E034 Atrophy of thyroid (acquired): Secondary | ICD-10-CM | POA: Diagnosis not present

## 2016-09-22 DIAGNOSIS — Z7984 Long term (current) use of oral hypoglycemic drugs: Secondary | ICD-10-CM | POA: Diagnosis not present

## 2016-09-22 DIAGNOSIS — E785 Hyperlipidemia, unspecified: Secondary | ICD-10-CM | POA: Diagnosis present

## 2016-09-22 DIAGNOSIS — E890 Postprocedural hypothyroidism: Secondary | ICD-10-CM | POA: Diagnosis present

## 2016-09-22 DIAGNOSIS — Z7952 Long term (current) use of systemic steroids: Secondary | ICD-10-CM | POA: Diagnosis not present

## 2016-09-22 DIAGNOSIS — L03115 Cellulitis of right lower limb: Secondary | ICD-10-CM | POA: Diagnosis present

## 2016-09-22 DIAGNOSIS — Z791 Long term (current) use of non-steroidal anti-inflammatories (NSAID): Secondary | ICD-10-CM | POA: Diagnosis not present

## 2016-09-22 DIAGNOSIS — E1169 Type 2 diabetes mellitus with other specified complication: Secondary | ICD-10-CM | POA: Diagnosis not present

## 2016-09-22 DIAGNOSIS — T79A22A Traumatic compartment syndrome of left lower extremity, initial encounter: Secondary | ICD-10-CM | POA: Diagnosis not present

## 2016-09-22 DIAGNOSIS — E119 Type 2 diabetes mellitus without complications: Secondary | ICD-10-CM | POA: Diagnosis present

## 2016-09-22 DIAGNOSIS — R109 Unspecified abdominal pain: Secondary | ICD-10-CM | POA: Diagnosis not present

## 2016-09-22 DIAGNOSIS — X58XXXA Exposure to other specified factors, initial encounter: Secondary | ICD-10-CM | POA: Diagnosis not present

## 2016-09-22 DIAGNOSIS — R740 Nonspecific elevation of levels of transaminase and lactic acid dehydrogenase [LDH]: Secondary | ICD-10-CM | POA: Diagnosis not present

## 2016-09-22 DIAGNOSIS — R609 Edema, unspecified: Secondary | ICD-10-CM | POA: Diagnosis present

## 2016-09-22 DIAGNOSIS — M609 Myositis, unspecified: Secondary | ICD-10-CM | POA: Diagnosis not present

## 2016-09-22 DIAGNOSIS — M79661 Pain in right lower leg: Secondary | ICD-10-CM | POA: Diagnosis not present

## 2016-09-22 DIAGNOSIS — Z79899 Other long term (current) drug therapy: Secondary | ICD-10-CM | POA: Diagnosis not present

## 2016-09-22 DIAGNOSIS — M6282 Rhabdomyolysis: Secondary | ICD-10-CM | POA: Diagnosis present

## 2016-09-22 DIAGNOSIS — I471 Supraventricular tachycardia: Secondary | ICD-10-CM | POA: Diagnosis present

## 2016-09-22 DIAGNOSIS — M21371 Foot drop, right foot: Secondary | ICD-10-CM | POA: Diagnosis present

## 2016-09-22 DIAGNOSIS — Z7982 Long term (current) use of aspirin: Secondary | ICD-10-CM | POA: Diagnosis not present

## 2016-09-22 LAB — COMPREHENSIVE METABOLIC PANEL
ALBUMIN: 3.9 g/dL (ref 3.5–5.0)
ALT: 144 U/L — ABNORMAL HIGH (ref 14–54)
ANION GAP: 11 (ref 5–15)
AST: 256 U/L — ABNORMAL HIGH (ref 15–41)
Alkaline Phosphatase: 142 U/L — ABNORMAL HIGH (ref 38–126)
BUN: 5 mg/dL — ABNORMAL LOW (ref 6–20)
CHLORIDE: 92 mmol/L — AB (ref 101–111)
CO2: 29 mmol/L (ref 22–32)
Calcium: 8.6 mg/dL — ABNORMAL LOW (ref 8.9–10.3)
Creatinine, Ser: 0.76 mg/dL (ref 0.44–1.00)
GFR calc non Af Amer: >60 mL/min (ref >60)
GLUCOSE: 205 mg/dL — AB (ref 65–99)
POTASSIUM: 4.3 mmol/L (ref 3.5–5.1)
SODIUM: 132 mmol/L — AB (ref 135–145)
Total Bilirubin: 1 mg/dL (ref 0.3–1.2)
Total Protein: 7.7 g/dL (ref 6.5–8.1)

## 2016-09-22 LAB — HEPATITIS PANEL, ACUTE
Hep A IgM: NEGATIVE
Hep B C IgM: NEGATIVE
Hepatitis B Surface Ag: NEGATIVE

## 2016-09-22 LAB — GLUCOSE, CAPILLARY
GLUCOSE-CAPILLARY: 191 mg/dL — AB (ref 65–99)
GLUCOSE-CAPILLARY: 192 mg/dL — AB (ref 65–99)
GLUCOSE-CAPILLARY: 159 mg/dL — AB (ref 65–99)
Glucose-Capillary: 185 mg/dL — ABNORMAL HIGH (ref 65–99)

## 2016-09-22 LAB — CBC WITH DIFFERENTIAL/PLATELET
BASOS PCT: 0 %
Basophils Absolute: 0 10*3/uL (ref 0.0–0.1)
EOS ABS: 0.3 10*3/uL (ref 0.0–0.7)
EOS PCT: 3 %
HCT: 38.9 % (ref 36.0–46.0)
HEMOGLOBIN: 12.9 g/dL (ref 12.0–15.0)
LYMPHS ABS: 1.9 10*3/uL (ref 0.7–4.0)
Lymphocytes Relative: 17 %
MCH: 31.3 pg (ref 26.0–34.0)
MCHC: 33.2 g/dL (ref 30.0–36.0)
MCV: 94.4 fL (ref 78.0–100.0)
MONOS PCT: 7 %
Monocytes Absolute: 0.7 10*3/uL (ref 0.1–1.0)
NEUTROS PCT: 73 %
Neutro Abs: 7.9 10*3/uL — ABNORMAL HIGH (ref 1.7–7.7)
PLATELETS: 299 10*3/uL (ref 150–400)
RBC: 4.12 MIL/uL (ref 3.87–5.11)
RDW: 13.2 % (ref 11.5–15.5)
WBC: 10.8 10*3/uL — AB (ref 4.0–10.5)

## 2016-09-22 LAB — BRAIN NATRIURETIC PEPTIDE: B NATRIURETIC PEPTIDE 5: 41.6 pg/mL (ref 0.0–100.0)

## 2016-09-22 LAB — ANTI-DNA ANTIBODY, DOUBLE-STRANDED: ds DNA Ab: <1 IU/mL (ref 0–9)

## 2016-09-22 LAB — CK: Total CK: 9600 U/L — ABNORMAL HIGH (ref 38–234)

## 2016-09-22 LAB — RHEUMATOID FACTOR: Rhuematoid fact SerPl-aCnc: <10 IU/mL (ref 0.0–13.9)

## 2016-09-22 MED ORDER — SODIUM CHLORIDE 0.9 % IV SOLN
INTRAVENOUS | Status: DC
  Administered 2016-09-22 – 2016-09-23 (×3): via INTRAVENOUS

## 2016-09-22 MED ORDER — DIPHENHYDRAMINE HCL 50 MG/ML IJ SOLN: 25.0000 mg | Freq: Four times a day (QID) | INTRAMUSCULAR | Status: DC | PRN

## 2016-09-22 MED ORDER — DIPHENHYDRAMINE HCL 25 MG PO CAPS
25.0000 mg | ORAL_CAPSULE | Freq: Four times a day (QID) | ORAL | Status: DC | PRN
  Administered 2016-09-22: 25 mg via ORAL
  Filled 2016-09-22: qty 1

## 2016-09-22 MED ORDER — ENOXAPARIN SODIUM 40 MG/0.4ML ~~LOC~~ SOLN
40.0000 mg | SUBCUTANEOUS | Status: DC
  Administered 2016-09-22 – 2016-09-25 (×3): 40 mg via SUBCUTANEOUS
  Filled 2016-09-22 (×4): qty 0.4

## 2016-09-22 MED ORDER — LEVOTHYROXINE SODIUM 100 MCG PO TABS
200.0000 ug | ORAL_TABLET | Freq: Every day | ORAL | Status: DC
  Administered 2016-09-23 – 2016-09-27 (×5): 200 ug via ORAL
  Filled 2016-09-22 (×5): qty 2

## 2016-09-22 NOTE — Progress Notes (Signed)
VASCULAR LAB PRELIMINARY  PRELIMINARY  PRELIMINARY  PRELIMINARY  Bilateral upper extremity venous duplex completed.    Preliminary report:   There is no obvious evidence of DVT or SVT noted in the bilateral upper extremities.   Destinie Thornsberry, RVT 09/22/2016, 4:36 PM

## 2016-09-22 NOTE — Progress Notes (Signed)
VASCULAR LAB PRELIMINARY  PRELIMINARY  PRELIMINARY  PRELIMINARY  Bilateral lower extremity venous duplex completed.    Preliminary report:  There is no obvious evidence of DVT or SVT noted in the bilateral lower extremities.   Loula Marcella, RVT 09/22/2016, 4:37 PM

## 2016-09-22 NOTE — Progress Notes (Addendum)
PROGRESS NOTE    Mathews ArgyleDana Malpass  UJW:[id number] DOB: [birth year] DOA: 09/20/2016 PCP: Porfirio Oarhelle Jeffery, PA-C   Brief Narrative: 50 y.o. female with hypertension, SVT, diabetes mellitus type 2, hyperlipidemia and hypothyroidism has been experiencing increasing peripheral edema involving the upper and lower extremity and abdominal distention. Patient also has been having severe pain all extremities. Patient has been recently placed on Lipitor last month which patient discontinued after the onset of above symptoms 2 weeks ago. Patient's metformin dose was doubled last month.  Assessment & Plan:   # Peripheral edema, erythematous rash and elevated LFTs (transaminitis): Exact etiology unknown. -Patient also has elevated CK level of 9149 on admission, elevated TSH level of 52. -CK level trending of associated with elevated liver enzymes. CT scan of abdomen and pelvis unremarkable. -Transaminitis may be caused by rhabdomyolysis and May related with her use of alcohol.. Monitor liver enzymes. Discussed with the gastroenterology today. -Elevated CK level may be caused by hypothyroidism or statin. -Patient had elevated lactic acid on admission. Also has elevated CRP and sedimentation level. -HIV negative, acute hepatitis panel unremarkable, anti-DNA antibody negative, rheumatoid factor negative. ANA level pending. -I will start gentle IV fluid because of worsening CK level today.  -I will continue empiric antibiotics for possible cellulitis with clindamycin. -Doppler ultrasound pending for upper and lower extremity.  # Abdomen distention: CT abdomen pelvis showed mild hepatomegaly with hepatic steatosis. No acute inflammatory changes.  # Nontraumatic rhabdomyolysis: Exact etiology unknown. May related with recent use of statin. Patient has normal kidney function. Worsening CK level today therefore I started gentle IV hydration. Monitor CK level.  # Hypothyroidism: TSH level mildly elevated to 52 with low  free T4. Change Synthroid to oral high-dose. Patient reported that she was taking Synthroid at home regularly and never missed a dose.  # Essential hypertension: Blood pressure acceptable. Continue current medications.    #Type 2 diabetes in obese patient: Continue sliding scale.  #History of supraventricular tachycardia: On Cardizem and metoprolol.  DVT prophylaxis: Change to Lovenox subcutaneous (once a day dose) Code Status: Full code Family Communication: No family present at bedside Disposition Plan: Likely discharge home in 1-2 days depending on clinical improvement.  Consultants:   None  Procedures: None Antimicrobials: Started clindamycin on 11/17.  Subjective: Patient was seen and examined at bedside. Patient continues to complain of all extremities pain. Denied nausea, vomiting, chest pain, shortness of breath, abdominal pain. Feels weak.  Objective: Vitals:   09/21/16 1343 09/21/16 2145 09/22/16 0520 09/22/16 0900  BP: 113/71 116/68 109/66 110/70  Pulse: 79 88 83 85  Resp: 16  17   Temp: 98.2 F (36.8 C) 99.1 F (37.3 C) 98.6 F (37 C)   TempSrc:  Oral Oral   SpO2: 96% 97% 97% 99%  Weight:      Height:        Intake/Output Summary (Last 24 hours) at 09/22/16 1400 Last data filed at 09/21/16 2109  Gross per 24 hour  Intake              360 ml  Output                0 ml  Net              360 ml   Filed Weights   09/21/16 0008 09/21/16 0500  Weight: 74.8 kg (165 lb) 75.1 kg (165 lb 9 oz)    Examination:  General exam: Not in distress, lying on bed comfortable  Respiratory  system: Clear to auscultation bilateral, no wheezing or crackle Cardiovascular system: Regular rate rhythm, S1-S2 normal.   Gastrointestinal system: Abdomen is Soft, nontender. Distended+, BS+ Central nervous system: Alert, awake, non -focal neuro exam Extremities: Bilateral upper and lower extremity erythematous rash, swelling, warmth. Skin: erythematous macular rash on upper and  lower extremities ?cellulitis Psychiatry: Judgement and insight appear normal. Mood & affect appropriate.     Data Reviewed: I have personally reviewed following labs and imaging studies  CBC:  Recent Labs Lab 09/20/16 1411 09/21/16 0524 09/22/16 0435  WBC 9.9 9.7 10.8*  NEUTROABS 7.7 7.2 7.9*  HGB 12.0 12.3 12.9  HCT 35.9* 36.8 38.9  MCV 93.2 93.4 94.4  PLT 291 279 299   Basic Metabolic Panel:  Recent Labs Lab 09/20/16 1411 09/21/16 0524 09/22/16 0435  NA 130* 131* 132*  K 3.7 3.6 4.3  CL 90* 91* 92*  CO2 27 24 29   GLUCOSE 259* 190* 205*  BUN 9 6 5*  CREATININE 0.68 0.68 0.76  CALCIUM 9.3 8.5* 8.6*  MG 1.6*  --   --    GFR: Estimated Creatinine Clearance: 77.8 mL/min (by C-G formula based on SCr of 0.76 mg/dL). Liver Function Tests:  Recent Labs Lab 09/20/16 1411 09/21/16 0524 09/22/16 0435  AST 199* 203* 256*  ALT 141* 127* 144*  ALKPHOS 118 109 142*  BILITOT 0.8 0.8 1.0  PROT 7.8 6.9 7.7  ALBUMIN 4.3 4.0 3.9   No results for input(s): LIPASE, AMYLASE in the last 168 hours. No results for input(s): AMMONIA in the last 168 hours. Coagulation Profile: No results for input(s): INR, PROTIME in the last 168 hours. Cardiac Enzymes:  Recent Labs Lab 09/21/16 0524 09/22/16 0435  CKTOTAL 9,149* 9,600*   BNP (last 3 results) No results for input(s): PROBNP in the last 8760 hours. HbA1C: No results for input(s): HGBA1C in the last 72 hours. CBG:  Recent Labs Lab 09/21/16 1406 09/21/16 1651 09/21/16 2143 09/22/16 0657 09/22/16 1143  GLUCAP 163* 182* 165* 191* 192*   Lipid Profile: No results for input(s): CHOL, HDL, LDLCALC, TRIG, CHOLHDL, LDLDIRECT in the last 72 hours. Thyroid Function Tests:  Recent Labs  09/20/16 1411  TSH 52.086*  FREET4 0.31*   Anemia Panel: No results for input(s): VITAMINB12, FOLATE, FERRITIN, TIBC, IRON, RETICCTPCT in the last 72 hours. Sepsis Labs:  Recent Labs Lab 09/20/16 1432 09/20/16 1822  09/21/16 0524  LATICACIDVEN 2.88* 1.81 2.1*    Recent Results (from the past 240 hour(s))  MRSA PCR Screening     Status: None   Collection Time: 09/21/16  1:22 AM  Result Value Ref Range Status   MRSA by PCR NEGATIVE NEGATIVE Final    Comment:        The GeneXpert MRSA Assay (FDA approved for NASAL specimens only), is one component of a comprehensive MRSA colonization surveillance program. It is not intended to diagnose MRSA infection nor to guide or monitor treatment for MRSA infections.          Radiology Studies: Ct Abdomen Pelvis W Contrast  Result Date: 09/21/2016 CLINICAL DATA:  50 year old with abdominal pain. History of right oophorectomy, cholecystectomy and hysterectomy. EXAM: CT ABDOMEN AND PELVIS WITH CONTRAST TECHNIQUE: Multidetector CT imaging of the abdomen and pelvis was performed using the standard protocol following bolus administration of intravenous contrast. CONTRAST:  ISOVUE-300 IOPAMIDOL (ISOVUE-300) INJECTION 61% COMPARISON:  01/26/2015 FINDINGS: Lower chest: Bandlike density at the left lung base is most suggestive for subsegmental atelectasis. No significant pleural effusions.  Hepatobiliary: Diffuse low-density in the liver parenchyma is compatible with hepatic steatosis. Focal fat along the medial right hepatic lobe on sequence 2, image 35. Portal venous system and hepatic veins are patent. The gallbladder has been removed without biliary dilatation. There also appears to be new focal fat along the anterior aspect of the caudate lobe that measures 3.2 x 1.6 cm. There is a vessel extending directly through this focal low-density area in the caudate which is suggestive for focal fat. Liver is also large for size measuring 20 cm in the craniocaudal dimension. Pancreas: Normal appearance of the pancreas without inflammation or duct dilatation. Spleen: Normal appearance of spleen without enlargement. Adrenals/Urinary Tract: Normal adrenal glands. Urinary  bladder is decompressed unremarkable. Normal appearance of both kidneys. Stomach/Bowel: Stomach is within normal limits. Appendix appears normal. No evidence of bowel wall thickening, distention, or inflammatory changes. Vascular/Lymphatic: Minimal atherosclerotic disease in the distal abdominal aorta. There is no evidence for aortic aneurysm. No significant lymph node enlargement in the abdomen or pelvis. Reproductive: Status post hysterectomy. No adnexal masses. Again noted are small low-density structures in the vagina region. The largest is on the right side measuring 1.3 x 0.4 cm. This structure has decreased in size and previously measured 2.0 x 1.0 cm. These are probably incidental findings especially since they are getting smaller but could represent Gartner duct cysts based on the location. Other: No free fluid.  No free air. Musculoskeletal: Disc space narrowing at L4-L5 and L5-S1. There is a central disc protrusion at L5-S1. IMPRESSION: Mild hepatomegaly with hepatic steatosis. Areas of focal fat in the liver, most prominent in the caudate lobe. No acute inflammatory changes in the abdomen or pelvis. Disc space disease in lower lumbar spine. Electronically Signed   By: Richarda OverlieAdam  Henn M.D.   On: 09/21/2016 09:58        Scheduled Meds: . benzonatate  100 mg Oral Q8H  . buPROPion  150 mg Oral BID  . clindamycin (CLEOCIN) IV  600 mg Intravenous Q8H  . diltiazem  180 mg Oral Daily  . heparin  5,000 Units Subcutaneous Q8H  . insulin aspart  0-9 Units Subcutaneous TID WC  . irbesartan  150 mg Oral Daily  . [START ON 09/23/2016] levothyroxine  200 mcg Oral QAC breakfast  . metoprolol  50 mg Oral BID  . pantoprazole  40 mg Oral Daily   Continuous Infusions: . sodium chloride       LOS: 0 days    Time spent: 28 minutes.    Dylin Ihnen Jaynie CollinsPrasad Andraya Frigon, MD [facility] Pager [phone]  If 7PM-7AM, please contact night-coverage [email or web address] Password [email or web address] 09/22/2016, 2:00 PM

## 2016-09-23 DIAGNOSIS — M6282 Rhabdomyolysis: Secondary | ICD-10-CM

## 2016-09-23 LAB — COMPREHENSIVE METABOLIC PANEL
ALK PHOS: 130 U/L — AB (ref 38–126)
ALT: 110 U/L — AB (ref 14–54)
AST: 170 U/L — AB (ref 15–41)
Albumin: 3.4 g/dL — ABNORMAL LOW (ref 3.5–5.0)
Anion gap: 8 (ref 5–15)
BILIRUBIN TOTAL: 0.5 mg/dL (ref 0.3–1.2)
BUN: 6 mg/dL (ref 6–20)
CALCIUM: 8.1 mg/dL — AB (ref 8.9–10.3)
CO2: 29 mmol/L (ref 22–32)
CREATININE: 0.68 mg/dL (ref 0.44–1.00)
Chloride: 98 mmol/L — ABNORMAL LOW (ref 101–111)
GFR calc Af Amer: >60 mL/min (ref >60)
Glucose, Bld: 177 mg/dL — ABNORMAL HIGH (ref 65–99)
Potassium: 3.4 mmol/L — ABNORMAL LOW (ref 3.5–5.1)
Sodium: 135 mmol/L (ref 135–145)
TOTAL PROTEIN: 6.9 g/dL (ref 6.5–8.1)

## 2016-09-23 LAB — GLUCOSE, CAPILLARY
GLUCOSE-CAPILLARY: 210 mg/dL — AB (ref 65–99)
GLUCOSE-CAPILLARY: 161 mg/dL — AB (ref 65–99)
GLUCOSE-CAPILLARY: 211 mg/dL — AB (ref 65–99)
GLUCOSE-CAPILLARY: 130 mg/dL — AB (ref 65–99)

## 2016-09-23 LAB — PROTIME-INR
INR: 1.1
PROTHROMBIN TIME: 14.2 s (ref 11.4–15.2)

## 2016-09-23 LAB — CK: Total CK: 7113 U/L — ABNORMAL HIGH (ref 38–234)

## 2016-09-23 MED ORDER — HYDROMORPHONE HCL 2 MG/ML IJ SOLN
0.5000 mg | Freq: Three times a day (TID) | INTRAMUSCULAR | Status: DC | PRN
  Administered 2016-09-23 – 2016-09-26 (×6): 0.5 mg via INTRAVENOUS
  Filled 2016-09-23 (×6): qty 1

## 2016-09-23 MED ORDER — OXYCODONE HCL 5 MG PO TABS
5.0000 mg | ORAL_TABLET | ORAL | Status: DC | PRN
  Administered 2016-09-23 – 2016-09-27 (×16): 5 mg via ORAL
  Filled 2016-09-23 (×16): qty 1

## 2016-09-23 MED ORDER — POTASSIUM CHLORIDE CRYS ER 20 MEQ PO TBCR
40.0000 meq | EXTENDED_RELEASE_TABLET | Freq: Every day | ORAL | Status: AC
  Administered 2016-09-23 – 2016-09-24 (×2): 40 meq via ORAL
  Filled 2016-09-23 (×2): qty 2

## 2016-09-23 NOTE — Progress Notes (Signed)
PROGRESS NOTE    Jessica ArgyleDana Knox  GEX:[id number] DOB: [birth year] DOA: 09/20/2016 PCP: Porfirio Oarhelle Jeffery, PA-C   Brief Narrative: 50 y.o. female with hypertension, SVT, diabetes mellitus type 2, hyperlipidemia and hypothyroidism has been experiencing increasing peripheral edema involving the upper and lower extremity and abdominal distention. Patient also has been having severe pain all extremities. Patient has been recently placed on Lipitor last month which patient discontinued after the onset of above symptoms 2 weeks ago. Patient's metformin dose was doubled last month.  Assessment & Plan:   # Peripheral edema, erythematous rash and elevated LFTs (transaminitis): Exact etiology unknown. -Patient  has elevated CK level of 9149 on admission, elevated TSH level of 52. -transaminitis likely due to rahbdomyolysis, enzymes trending down.  -Patient had elevated lactic acid on admission. Also has elevated CRP and sedimentation level. -HIV negative, acute hepatitis panel unremarkable, anti-DNA antibody negative, rheumatoid factor negative. ANA level pending. -continue empiric antibiotics for possible cellulitis with clindamycin. -Doppler ultrasound of  upper and lower extremities negative for DVTs  # Abdomen distention: CT abdomen pelvis showed mild hepatomegaly with hepatic steatosis. No acute inflammatory changes.  # Nontraumatic rhabdomyolysis: Exact etiology unknown. May be related with recent use of statin. Patient has normal kidney function. CK level trending down. Continue IVF.  # Hypokalemia: Repleted KCL. Monitor lab.  # Hypothyroidism: TSH level  elevated to 52 with low free T4. Increased synthroid dose. Patient reported that she was taking Synthroid at home regularly and never missed a dose.  # Essential hypertension: Blood pressure acceptable. Continue current medications.    #Type 2 diabetes in obese patient: Continue sliding scale.  #History of supraventricular tachycardia: On  Cardizem and metoprolol.  DVT prophylaxis: lovenox sq Code Status: Full code Family Communication: No family present at bedside Disposition Plan: Likely discharge home in 1-2 days depending on clinical improvement.  Consultants:   None  Procedures: None Antimicrobials: Started clindamycin on 11/17.  Subjective: Patient was seen and examined at bedside. He still has lower extremities pain swelling and redness. Denied chills, headache, dizziness, nausea, vomiting, chest pain or shortness of breath. Eating okay. No urinary or bowel symptoms. Objective: Vitals:   09/22/16 1508 09/22/16 1935 09/23/16 0615 09/23/16 0819  BP: 112/68 111/72  101/70  Pulse: 82 82  85  Resp: 16 16 18 18   Temp: 98.5 F (36.9 C) 98.7 F (37.1 C)  97.8 F (36.6 C)  TempSrc: Oral Oral  Oral  SpO2: 98% 100%  100%  Weight:      Height:        Intake/Output Summary (Last 24 hours) at 09/23/16 1359 Last data filed at 09/23/16 0600  Gross per 24 hour  Intake             1180 ml  Output                0 ml  Net             1180 ml   Filed Weights   09/21/16 0008 09/21/16 0500  Weight: 74.8 kg (165 lb) 75.1 kg (165 lb 9 oz)    Examination:  General exam: Lying on bed comfortable, not in distress Respiratory system: Clear bilaterally, no wheezing or crackle Cardiovascular system: Regular rate and rhythm, S1-S2 normal.   Gastrointestinal system: Abdomen is Soft, nontender. Distended+, BS+ Central nervous system: Alert, awake, non -focal neuro exam Extremities: Bilateral upper and lower extremity erythematous rash, swelling, warmth. No significant improvement Skin: erythematous macular rash on upper and  lower extremities ?cellulitis Psychiatry: Judgement and insight appear normal. Mood & affect appropriate.     Data Reviewed: I have personally reviewed following labs and imaging studies  CBC:  Recent Labs Lab 09/20/16 1411 09/21/16 0524 09/22/16 0435  WBC 9.9 9.7 10.8*  NEUTROABS 7.7 7.2  7.9*  HGB 12.0 12.3 12.9  HCT 35.9* 36.8 38.9  MCV 93.2 93.4 94.4  PLT 291 279 299   Basic Metabolic Panel:  Recent Labs Lab 09/20/16 1411 09/21/16 0524 09/22/16 0435 09/23/16 0538  NA 130* 131* 132* 135  K 3.7 3.6 4.3 3.4*  CL 90* 91* 92* 98*  CO2 27 24 29 29   GLUCOSE 259* 190* 205* 177*  BUN 9 6 5* 6  CREATININE 0.68 0.68 0.76 0.68  CALCIUM 9.3 8.5* 8.6* 8.1*  MG 1.6*  --   --   --    GFR: Estimated Creatinine Clearance: 77.8 mL/min (by C-G formula based on SCr of 0.68 mg/dL). Liver Function Tests:  Recent Labs Lab 09/20/16 1411 09/21/16 0524 09/22/16 0435 09/23/16 0538  AST 199* 203* 256* 170*  ALT 141* 127* 144* 110*  ALKPHOS 118 109 142* 130*  BILITOT 0.8 0.8 1.0 0.5  PROT 7.8 6.9 7.7 6.9  ALBUMIN 4.3 4.0 3.9 3.4*   No results for input(s): LIPASE, AMYLASE in the last 168 hours. No results for input(s): AMMONIA in the last 168 hours. Coagulation Profile:  Recent Labs Lab 09/23/16 0538  INR 1.10   Cardiac Enzymes:  Recent Labs Lab 09/21/16 0524 09/22/16 0435 09/23/16 0538  CKTOTAL 9,149* 9,600* 7,113*   BNP (last 3 results) No results for input(s): PROBNP in the last 8760 hours. HbA1C: No results for input(s): HGBA1C in the last 72 hours. CBG:  Recent Labs Lab 09/22/16 1143 09/22/16 1727 09/22/16 2048 09/23/16 0818 09/23/16 1141  GLUCAP 192* 159* 185* 161* 211*   Lipid Profile: No results for input(s): CHOL, HDL, LDLCALC, TRIG, CHOLHDL, LDLDIRECT in the last 72 hours. Thyroid Function Tests:  Recent Labs  09/20/16 1411  TSH 52.086*  FREET4 0.31*   Anemia Panel: No results for input(s): VITAMINB12, FOLATE, FERRITIN, TIBC, IRON, RETICCTPCT in the last 72 hours. Sepsis Labs:  Recent Labs Lab 09/20/16 1432 09/20/16 1822 09/21/16 0524  LATICACIDVEN 2.88* 1.81 2.1*    Recent Results (from the past 240 hour(s))  MRSA PCR Screening     Status: None   Collection Time: 09/21/16  1:22 AM  Result Value Ref Range Status   MRSA  by PCR NEGATIVE NEGATIVE Final    Comment:        The GeneXpert MRSA Assay (FDA approved for NASAL specimens only), is one component of a comprehensive MRSA colonization surveillance program. It is not intended to diagnose MRSA infection nor to guide or monitor treatment for MRSA infections.          Radiology Studies: No results found.      Scheduled Meds: . benzonatate  100 mg Oral Q8H  . buPROPion  150 mg Oral BID  . clindamycin (CLEOCIN) IV  600 mg Intravenous Q8H  . diltiazem  180 mg Oral Daily  . enoxaparin (LOVENOX) injection  40 mg Subcutaneous Q24H  . insulin aspart  0-9 Units Subcutaneous TID WC  . irbesartan  150 mg Oral Daily  . levothyroxine  200 mcg Oral QAC breakfast  . metoprolol  50 mg Oral BID  . pantoprazole  40 mg Oral Daily  . potassium chloride  40 mEq Oral Daily   Continuous Infusions: .  sodium chloride 75 mL/hr at 09/23/16 0100     LOS: 1 day    Time spent: 26 minutes.    Zaevion Parke Jaynie Collins, MD [facility] Pager [phone]  If 7PM-7AM, please contact night-coverage [email or web address] Password [email or web address] 09/23/2016, 1:59 PM

## 2016-09-23 NOTE — Progress Notes (Signed)
Pt pulled out IV and IV nurse was unable to restart it. Pt requested that IV not be restarted until this morning when day shift IV nurse could attempt to start another IV.

## 2016-09-24 ENCOUNTER — Inpatient Hospital Stay (HOSPITAL_COMMUNITY): Payer: BLUE CROSS/BLUE SHIELD

## 2016-09-24 DIAGNOSIS — I10 Essential (primary) hypertension: Secondary | ICD-10-CM

## 2016-09-24 LAB — COMPREHENSIVE METABOLIC PANEL
ALK PHOS: 109 U/L (ref 38–126)
ALT: 90 U/L — ABNORMAL HIGH (ref 14–54)
ANION GAP: 9 (ref 5–15)
AST: 143 U/L — ABNORMAL HIGH (ref 15–41)
Albumin: 3.4 g/dL — ABNORMAL LOW (ref 3.5–5.0)
BILIRUBIN TOTAL: 0.2 mg/dL — AB (ref 0.3–1.2)
BUN: 6 mg/dL (ref 6–20)
CALCIUM: 7.8 mg/dL — AB (ref 8.9–10.3)
CO2: 25 mmol/L (ref 22–32)
Chloride: 101 mmol/L (ref 101–111)
Creatinine, Ser: 0.74 mg/dL (ref 0.44–1.00)
GFR calc non Af Amer: >60 mL/min (ref >60)
Glucose, Bld: 130 mg/dL — ABNORMAL HIGH (ref 65–99)
Potassium: 4 mmol/L (ref 3.5–5.1)
SODIUM: 135 mmol/L (ref 135–145)
TOTAL PROTEIN: 6.3 g/dL — AB (ref 6.5–8.1)

## 2016-09-24 LAB — GLUCOSE, CAPILLARY
GLUCOSE-CAPILLARY: 152 mg/dL — AB (ref 65–99)
GLUCOSE-CAPILLARY: 181 mg/dL — AB (ref 65–99)
Glucose-Capillary: 165 mg/dL — ABNORMAL HIGH (ref 65–99)
Glucose-Capillary: 172 mg/dL — ABNORMAL HIGH (ref 65–99)

## 2016-09-24 LAB — ANTINUCLEAR ANTIBODIES, IFA: ANA Ab, IFA: NEGATIVE

## 2016-09-24 LAB — CK: CK TOTAL: 5653 U/L — AB (ref 38–234)

## 2016-09-24 MED ORDER — SODIUM CHLORIDE 0.9% FLUSH: 10.0000 mL | INTRAVENOUS | Status: DC | PRN

## 2016-09-24 NOTE — Evaluation (Signed)
Occupational Therapy Evaluation Patient Details Name: Mathews ArgyleDana Malpass MRN: [id number] DOB: 10-29-1966 Today's Date: 09/24/2016    History of Present Illness Pt is a 50 y.o. female who presents with peripheral edema and abdominal distention and severe pain in all extremities. Pt has a past medical history significant for hypertension, SVT, diabetes mellitus type 2, hyperlipidemia, and hypothyroidism. '   Clinical Impression   PTA, pt reports independence with ADL and IADL but reports that performing daily tasks was becoming increasingly taxing requiring increased time and energy. Pt currently limited by pain and initially refusing functional mobility but was willing to complete ADL sitting EOB and [facility]. Pt requiring mod assist for ADL and functional mobility this session. Pt with significant peripheral edema with all extremities affected but with increased pain in R LE as compared to L. Pt would benefit from continued OT services while admitted to improve independence with ADL and functional mobility. OT will continue to follow acutely.     Follow Up Recommendations  No OT follow up;Supervision/Assistance - 24 hour    Equipment Recommendations  3 in 1 bedside comode    Recommendations for Other Services       Precautions / Restrictions Precautions Precautions: Fall Restrictions Weight Bearing Restrictions: No      Mobility Bed Mobility Overal bed mobility: Needs Assistance Bed Mobility: Supine to Sit;Sit to Supine     Supine to sit: Min guard Sit to supine: Min assist   General bed mobility comments: Pt requiring increased time, VC's for safety and sequence as well as min assist to return to bed safely.  Transfers Overall transfer level: Needs assistance Equipment used: Rolling walker (2 wheeled) Transfers: Stand Pivot Transfers   Stand pivot transfers: Mod assist       General transfer comment: Pt impulsive and with decreased willingness  to heed instruction to complete transfers and use of RW safely.    Balance                                            ADL Overall ADL's : Needs assistance/impaired     Grooming: Set up;Sitting   Upper Body Bathing: Set up;Sitting   Lower Body Bathing: Moderate assistance;Sitting/lateral leans   Upper Body Dressing : Set up;Sitting   Lower Body Dressing: Moderate assistance;Sitting/lateral leans   Toilet Transfer: Moderate assistance;Stand-pivot;RW;BSC Toilet Transfer Details (indicate cue type and reason): [facility], pt unwilling to listen to instruction on safe transfer and insistent upon returning to bed facing the bed rather than backing up and sitting down. Educated pt on need to perform safely with pt impulsive and unwilling to heed instruction. Toileting- Clothing Manipulation and Hygiene: Moderate assistance;Sit to/from stand         General ADL Comments: Husband present but sleeping during session. Pt with increased pain limiting functional mobility this session. Pt initially refusing mobility but willing to complete stand-pivot [facility]. Pt unable to bear weight on R LE.     Vision Vision Assessment?: No apparent visual deficits   Perception     Praxis      Pertinent Vitals/Pain Pain Assessment: 0-10 Pain Score: 9  Pain Location: R leg mostly; but pain in all extremities. Pain Descriptors / Indicators: Throbbing ([place]) Pain Intervention(s): Limited activity within patient's tolerance;Monitored during session;Relaxation;Utilized relaxation techniques;Repositioned  Hand Dominance Right   Extremity/Trunk Assessment Upper Extremity Assessment Upper Extremity Assessment: Overall WFL for tasks assessed   Lower Extremity Assessment Lower Extremity Assessment: RLE deficits/detail;LLE deficits/detail RLE Deficits / Details: Decreased strength and ROM secondary to edema and pain. Pain in R>L. RLE: Unable to  fully assess due to pain LLE Deficits / Details: Decreased strength and ROM secondary to edema and pain. Pain in R>L. LLE: Unable to fully assess due to pain       Communication Communication Communication: No difficulties   Cognition Arousal/Alertness: Awake/alert Behavior During Therapy: Impulsive Overall Cognitive Status: Within Functional Limits for tasks assessed                     General Comments       Exercises       Shoulder Instructions      Home Living Family/patient expects to be discharged to:: Private residence Living Arrangements: Spouse/significant other;Children Available Help at Discharge: Family;Available PRN/intermittently Type of Home: House Home Access: Stairs to enter Enter[employer] CorporationEntrance Stairs-Number of Steps: 6 Entrance Stairs-Rails: Can reach both Home Layout: One level     Bathroom Shower/Tub: Tub/shower unit;Walk-in shower Shower/tub characteristics: Engineer, building servicesCurtain Bathroom Toilet: Standard Bathroom Accessibility: Yes   Home Equipment:  (Planning on installing hand held shower head.)          Prior Functioning/Environment Level of Independence: Independent        Comments: Able to complete independently but required increased energy and time. Pt does not wear socks.        OT Problem List: Decreased strength;Decreased range of motion;Decreased activity tolerance;Impaired balance (sitting and/or standing);Decreased safety awareness;Decreased knowledge of use of DME or AE;Pain;Increased edema   OT Treatment/Interventions: Self-care/ADL training;Therapeutic exercise;Patient/family education;Balance training;Manual therapy;DME and/or AE instruction;Therapeutic activities    OT Goals(Current goals can be found in the care plan section) Acute Rehab OT Goals Patient Stated Goal: to have less pain OT Goal Formulation: With patient Time For Goal Achievement: 10/08/16 Potential to Achieve Goals: Good ADL Goals Pt Will Perform Lower Body Bathing:  with modified independence;sit to/from stand Pt Will Perform Lower Body Dressing: with modified independence;sit to/from stand Pt Will Transfer to Toilet: with modified independence;ambulating;regular height toilet;bedside commode Pt Will Perform Toileting - Clothing Manipulation and hygiene: with modified independence;sit to/from stand Pt Will Perform Tub/Shower Transfer: with supervision;3 in 1;rolling walker;Shower transfer;Tub transfer Additional ADL Goal #1: Pt will independently demonstrate 3 edema management techniques as a prerequisite for improved ADL independence. Additional ADL Goal #2: Pt will independently demonstrate 3 energy conservation strategies during 10 minute ADL session as a prerequisite for improved ADL independence.  OT Frequency: Min 2X/week   Barriers to D/C:            Co-evaluation              End of Session Equipment Utilized During Treatment: Gait belt;Rolling walker  Activity Tolerance: Patient limited by pain Patient left: in bed;with call bell/phone within reach;with family/visitor present   Time: [id number] OT Time Calculation (min): 66 min Charges:  OT General Charges $OT Visit: 1 Procedure OT Evaluation $OT Eval Moderate Complexity: 1 Procedure OT Treatments $Self Care/Home Management : 38-52 mins   Doristine SectionCharity A Hazley Dezeeuw, OTR/L [phone] 09/24/2016, 10:02 AM

## 2016-09-24 NOTE — Progress Notes (Signed)
PT Cancellation Note  Patient Details Name: Jessica Knox MRN: [id number] DOB: 12/07/1965   Cancelled Treatment:    Reason Eval/Treat Not Completed: Fatigue/lethargy limiting ability to participate;Pain limiting ability to participate.  Pt was able to work with OT earlier this AM, but is hurting now and pain meds are not due until lunch time.  Pt is agreeable for PT to check back this PM to mobilize, but "I'm not making any promises" re: mobilizing.  PT will check back later.   Thanks,    Rollene Rotundaebecca B. Stark Aguinaga, PT, DPT [phone]   09/24/2016, 10:54 AM

## 2016-09-24 NOTE — Progress Notes (Signed)
Orthopedic Tech Progress Note Patient Details:  Jessica Knox 07/04/1966 [id number]  Ortho Devices Type of Ortho Device: Postop shoe/boot Ortho Device/Splint Location: RLE prafo boot Ortho Device/Splint Interventions: Ordered, Application   Jennye MoccasinHughes, Jessica Knox 09/24/2016, 4:38 PM

## 2016-09-24 NOTE — Progress Notes (Signed)
Peripherally Inserted Central Catheter/Midline Placement  The IV Nurse has discussed with the patient and/or persons authorized to consent for the patient, the purpose of this procedure and the potential benefits and risks involved with this procedure.  The benefits include less needle sticks, lab draws from the catheter, and the patient may be discharged home with the catheter. Risks include, but not limited to, infection, bleeding, blood clot (thrombus formation), and puncture of an artery; nerve damage and irregular heartbeat and possibility to perform a PICC exchange if needed/ordered by physician.  Alternatives to this procedure were also discussed.  Bard Power PICC patient education guide, fact sheet on infection prevention and patient information card has been provided to patient /or left at bedside.    PICC/Midline Placement Documentation        Jessica Knox, Jessica Knox 09/24/2016, 10:19 PM

## 2016-09-24 NOTE — Evaluation (Signed)
Physical Therapy Evaluation Patient Details Name: Jessica Knox MRN: [id number] DOB: [birth year] Today's Date: 09/24/2016   History of Present Illness  Pt is a 50 y.o. female who presents with peripheral edema and abdominal distention and severe pain in all extremities (right lower leg the worst). Dx with peripherial edema, erythematous rash, and elevated LFTs (transaminitis), etiology unclear, medical workup pending.  Pt with negative DVT testing, CT of abdomen/pelvis with mild hepatomegaly and hepatic stenosis, non traumatic rhabdomyolysis (suspect might be related to recent statin use, CK levels are trending down), hypokalemia, and hypothyroidism.  Pt has a past medical history significant for hypertension, SVT, diabetes mellitus type 2, hyperlipidemia, and hypothyroidism. '  Clinical Impression  Pt is very painful in her right foot and ankle with increased edema/redness, decreased ability to WB or even put it in a dependent position (and pt was pre medicated prior to PT).  Pt was able to stand and pivot (with significant extra time needed due to pain) with RW.  She was unable to hop and has 6 STE her home.  I do think she has potential to progress well once pain is better controlled and she has a better explanation as to what is happening to her (she is very stressed at this time and worried).   PT to follow acutely for deficits listed below.       Follow Up Recommendations Home health PT;Supervision for mobility/OOB    Equipment Recommendations  Rolling walker with 5" wheels    Recommendations for Other Services       Precautions / Restrictions Precautions Precautions: Fall Precaution Comments: due to not really being able to put weight through her right leg (or even have it dependent).  Restrictions Weight Bearing Restrictions: No      Mobility  Bed Mobility Overal bed mobility: Modified Independent Bed Mobility: Supine to Sit;Sit to Supine     Supine to sit: Modified  independent (Device/Increase time);HOB elevated Sit to supine: Supervision   General bed mobility comments: Pt able to get EOB with extra time and heavy use of bil upper extremities.  Pt required multiple attempts as each time she took her right leg over EOB she reported significant throbbing into her lower leg and foot.  At one point she also reported room spinning sensation, but I did not notice any nystagmus and she did not report it the second time she sat up.   Transfers Overall transfer level: Needs assistance Equipment used: Rolling walker (2 wheeled) Transfers: Sit to/from [employer] Sit to Stand: +2 safety/equipment;Min assist Stand pivot transfers: +2 safety/equipment;Mod assist       General transfer comment: Two person for safety min assist to get to standing EOB with RW.  Verbal cues for safe hand placement.  Pt would really benefit from a youth RW for transfers as she is short in stature and this wide, tall RW doesn't give her good mechanical advantage.  Mod assist to pivot as pt is unable to get good enough leverage through her sore arms to hop.    Ambulation/Gait             General Gait Details: unable at this time due to unable to WB through right foot due to pain.   Stairs            Wheelchair Mobility    Modified Rankin (Stroke Patients Only)       Balance Overall balance assessment: Needs assistance Sitting-balance support: Feet supported;No upper extremity supported Sitting balance-Leahy  Scale: Good     Standing balance support: Bilateral upper extremity supported Standing balance-Leahy Scale: Poor                               Pertinent Vitals/Pain Pain Assessment: Faces Faces Pain Scale: Hurts worst Pain Location: right lower leg with foot in dependent position and with PROM Pain Descriptors / Indicators: Aching;Burning Pain Intervention(s): Limited activity within patient's tolerance;Monitored during  session;Repositioned;Premedicated before session    Home Living Family/patient expects to be discharged to:: Private residence Living Arrangements: Spouse/significant other;Children Available Help at Discharge: Family;Available PRN/intermittently Type of Home: House Home Access: Stairs to enter Entrance Stairs-Rails: Can reach both Entrance Stairs-Number of Steps: 6 Home Layout: One level Home Equipment:  (Planning on installing hand held shower head.)      Prior Function Level of Independence: Independent         Comments: Able to complete independently but required increased energy and time. Pt does not wear socks.     Hand Dominance   Dominant Hand: Right    Extremity/Trunk Assessment   Upper Extremity Assessment: Defer to OT evaluation           Lower Extremity Assessment: RLE deficits/detail;LLE deficits/detail RLE Deficits / Details: bil legs are edematous more than usual, right lower leg is red and swollen and more sensative to touch than the left leg.  She is starting to develop quite a bit of edema on the dorsal portion of her foot and toes and is unable to actively DF to neutral (she can fire her DF, but cannot get to netutral position with out AAROM).  knee at least 3/5, hip at least 3/5 on this side.  LLE Deficits / Details: although edematous, leg is strong enought to support most of her body weight to transfer with walker OOB to chair.   Cervical / Trunk Assessment: Other exceptions  Communication   Communication: No difficulties  Cognition Arousal/Alertness: Awake/alert Behavior During Therapy: WFL for tasks assessed/performed Overall Cognitive Status: Within Functional Limits for tasks assessed                      General Comments      Exercises Total Joint Exercises Ankle Circles/Pumps: AAROM;PROM;Right;5 reps;Other (comment) (30 second holds, showed how to do with sheet)   Assessment/Plan    PT Assessment Patient needs continued PT  services  PT Problem List Decreased strength;Decreased range of motion;Decreased activity tolerance;Decreased balance;Decreased mobility;Decreased knowledge of use of DME;Impaired sensation;Pain;Obesity;Decreased skin integrity          PT Treatment Interventions DME instruction;Gait training;Stair training;Functional mobility training;Therapeutic activities;Therapeutic exercise;Balance training;Patient/family education;Manual techniques    PT Goals (Current goals can be found in the Care Plan section)  Acute Rehab PT Goals Patient Stated Goal: to figure out why this happended, decrease pain, and return to independence (she is also worried about surgery and losing her leg--no mention that this is a risk).  PT Goal Formulation: With patient Time For Goal Achievement: 10/08/16 Potential to Achieve Goals: Good    Frequency Min 3X/week   Barriers to discharge Inaccessible home environment Pt has 6 STE and as of today that would be very difficult for her to do    Co-evaluation               End of Session   Activity Tolerance: Patient limited by pain Patient left: in chair;with call bell/phone within reach Nurse  Communication: Mobility status;Other (comment) (recommending PRAFO for R foot drop prevention. )         Time: [id number] (did not charge for the whole time due to pt/PT talking) PT Time Calculation (min) (ACUTE ONLY): 53 min   Charges:   PT Evaluation $PT Eval Moderate Complexity: 1 Procedure PT Treatments $Therapeutic Activity: 8-22 mins   PT G Codes:        Olaf Mesa B Kamaljit Hizer 09/24/2016, 3:10 PM

## 2016-09-24 NOTE — Progress Notes (Signed)
PROGRESS NOTE    Jessica Knox  YHC:[id number] DOB: 29-Sep-1966 DOA: 09/20/2016 PCP: Harrison Mons, PA-C   Brief Narrative: 50 y.o. female with hypertension, SVT, diabetes mellitus type 2, hyperlipidemia and hypothyroidism has been experiencing increasing peripheral edema involving the upper and lower extremity and abdominal distention. Patient also has been having severe pain all extremities. Patient has been recently placed on Lipitor last month which patient discontinued after the onset of above symptoms 2 weeks ago. Patient's metformin dose was doubled last month.  Assessment & Plan:   # Peripheral edema, erythematous rash and elevated LFTs (transaminitis): Exact etiology unknown. -Patient  has elevated CK level of 9149 on admission, elevated TSH level of 52. -transaminitis likely due to rahbdomyolysis, enzymes trending down.  -Patient had elevated lactic acid on admission. Also has elevated CRP and sedimentation level. Repeating CRP and ESR tomorrow. -HIV negative, acute hepatitis panel unremarkable, anti-DNA antibody negative, rheumatoid factor negative. ANA level negative -continue empiric antibiotics for possible cellulitis with clindamycin. -Doppler ultrasound of  upper and lower extremities negative for DVTs. Patient has good distal dorsalis pedis pulse.  -supportive care, leg elevation and pain management.   # Abdomen distention: CT abdomen pelvis showed mild hepatomegaly with hepatic steatosis. No acute inflammatory changes.  # Nontraumatic rhabdomyolysis: Exact etiology unknown. May be related with recent use of statin and in the setting of hypothyroidism. Patient has normal kidney function. CK level trending down to 5653 .  -dc IVF because of swelling -repeat lab in am.  # Hypokalemia: improved now  # Hypothyroidism: TSH level  elevated to 52 with low free T4. Increased synthroid dose to 200 mcg. Patient reported that she was taking Synthroid at home regularly and never  missed a dose.  # Essential hypertension: Blood pressure acceptable. Continue current medications.    #Type 2 diabetes in obese patient: Continue sliding scale.  #History of supraventricular tachycardia: On Cardizem and metoprolol.  PT/OT eval  DVT prophylaxis: lovenox sq Code Status: Full code Family Communication: Patient's husband at bedside.  Disposition Plan: Likely discharge home in 1-2 days depending on clinical improvement.  Consultants:   None  Procedures: None Antimicrobials: Started clindamycin on 11/17.  Subjective: Patient was seen and examined at bedside. Still complaining of lower extremity pain mostly in the right leg. Asking for more pain medication. Denied fever, chills, nausea, vomiting, chest pain, shortness of breath. Objective: Vitals:   09/23/16 2100 09/24/16 0438 09/24/16 1000 09/24/16 1300  BP: 139/83 110/66 132/72 122/71  Pulse: 82 86 81 75  Resp: _0 Temp: 98.1 F (36.7 C) 98.7 F (37.1 C)  98 F (36.7 C)  TempSrc: Oral Oral  Oral  SpO2: 94% 93%  97%  Weight:      Height:        Intake/Output Summary (Last 24 hours) at 09/24/16 1526 Last data filed at 09/24/16 1100  Gross per 24 hour  Intake             3310 ml  Output                0 ml  Net             3310 ml   Filed Weights   09/21/16 0008 09/21/16 0500  Weight: 74.8 kg (165 lb) 75.1 kg (165 lb 9 oz)    Examination:  General exam: Sitting on bed, not in distress. Respiratory system: Clear bilaterally, no wheezing or crackle. Cardiovascular system: Regular rate rhythm, S1 and S2  normal. Gastrointestinal system: Abdomen is Soft, nontender. Distended+, BS+ Central nervous system: Alert, awake, non -focal neuro exam Extremities: Bilateral lower extremity edema more on the right side associated with erythematous rashes. Bilateral upper extremities swelling. Possible distal dorsalis pedis pulse. Skin warm to touch Skin: erythematous macular rash on upper and lower extremities  mostly in the right lower extremity. Psychiatry: Judgement and insight appear normal. Mood & affect appropriate.     Data Reviewed: I have personally reviewed following labs and imaging studies  CBC:  Recent Labs Lab 09/20/16 1411 09/21/16 0524 09/22/16 0435  WBC 9.9 9.7 10.8*  NEUTROABS 7.7 7.2 7.9*  HGB 12.0 12.3 12.9  HCT 35.9* 36.8 38.9  MCV 93.2 93.4 94.4  PLT 291 279 253   Basic Metabolic Panel:  Recent Labs Lab 09/20/16 1411 09/21/16 0524 09/22/16 0435 09/23/16 0538 09/24/16 0257  NA 130* 131* 132* 135 135  K 3.7 3.6 4.3 3.4* 4.0  CL 90* 91* 92* 98* 101  CO2 _0 GLUCOSE 259* 190* 205* 177* 130*  BUN 9 6 5* 6 6  CREATININE 0.68 0.68 0.76 0.68 0.74  CALCIUM 9.3 8.5* 8.6* 8.1* 7.8*  MG 1.6*  --   --   --   --    GFR: Estimated Creatinine Clearance: 77.8 mL/min (by C-G formula based on SCr of 0.74 mg/dL). Liver Function Tests:  Recent Labs Lab 09/20/16 1411 09/21/16 0524 09/22/16 0435 09/23/16 0538 09/24/16 0257  AST 199* 203* 256* 170* 143*  ALT 141* 127* 144* 110* 90*  ALKPHOS 118 109 142* 130* 109  BILITOT 0.8 0.8 1.0 0.5 0.2*  PROT 7.8 6.9 7.7 6.9 6.3*  ALBUMIN 4.3 4.0 3.9 3.4* 3.4*   No results for input(s): LIPASE, AMYLASE in the last 168 hours. No results for input(s): AMMONIA in the last 168 hours. Coagulation Profile:  Recent Labs Lab 09/23/16 0538  INR 1.10   Cardiac Enzymes:  Recent Labs Lab 09/21/16 0524 09/22/16 0435 09/23/16 0538 09/24/16 0805  CKTOTAL 9,149* 9,600* 7,113* 5,653*   BNP (last 3 results) No results for input(s): PROBNP in the last 8760 hours. HbA1C: No results for input(s): HGBA1C in the last 72 hours. CBG:  Recent Labs Lab 09/23/16 0818 09/23/16 1141 09/23/16 1641 09/24/16 0633 09/24/16 1134  GLUCAP 161* 211* 130* 165* 152*   Lipid Profile: No results for input(s): CHOL, HDL, LDLCALC, TRIG, CHOLHDL, LDLDIRECT in the last 72 hours. Thyroid Function Tests: No results for input(s):  TSH, T4TOTAL, FREET4, T3FREE, THYROIDAB in the last 72 hours. Anemia Panel: No results for input(s): VITAMINB12, FOLATE, FERRITIN, TIBC, IRON, RETICCTPCT in the last 72 hours. Sepsis Labs:  Recent Labs Lab 09/20/16 1432 09/20/16 1822 09/21/16 0524  LATICACIDVEN 2.88* 1.81 2.1*    Recent Results (from the past 240 hour(s))  MRSA PCR Screening     Status: None   Collection Time: 09/21/16  1:22 AM  Result Value Ref Range Status   MRSA by PCR NEGATIVE NEGATIVE Final    Comment:        The GeneXpert MRSA Assay (FDA approved for NASAL specimens only), is one component of a comprehensive MRSA colonization surveillance program. It is not intended to diagnose MRSA infection nor to guide or monitor treatment for MRSA infections.          Radiology Studies: No results found.      Scheduled Meds: . benzonatate  100 mg Oral Q8H  . buPROPion  150 mg Oral BID  . clindamycin (CLEOCIN)  IV  600 mg Intravenous Q8H  . diltiazem  180 mg Oral Daily  . enoxaparin (LOVENOX) injection  40 mg Subcutaneous Q24H  . insulin aspart  0-9 Units Subcutaneous TID WC  . irbesartan  150 mg Oral Daily  . levothyroxine  200 mcg Oral QAC breakfast  . metoprolol  50 mg Oral BID  . pantoprazole  40 mg Oral Daily   Continuous Infusions:    LOS: 2 days    Dron Tanna Furry, MD [facility] Pager [phone]  If 7PM-7AM, please contact night-coverage [email or web address] Password [email or web address] 09/24/2016, 3:26 PM

## 2016-09-25 ENCOUNTER — Inpatient Hospital Stay (HOSPITAL_COMMUNITY): Payer: BLUE CROSS/BLUE SHIELD

## 2016-09-25 ENCOUNTER — Encounter (HOSPITAL_COMMUNITY): Payer: Self-pay | Admitting: Radiology

## 2016-09-25 DIAGNOSIS — M7989 Other specified soft tissue disorders: Secondary | ICD-10-CM

## 2016-09-25 LAB — SEDIMENTATION RATE: SED RATE: 100 mm/h — AB (ref 0–22)

## 2016-09-25 LAB — GLUCOSE, CAPILLARY
GLUCOSE-CAPILLARY: 134 mg/dL — AB (ref 65–99)
Glucose-Capillary: 172 mg/dL — ABNORMAL HIGH (ref 65–99)

## 2016-09-25 LAB — CK: CK TOTAL: 4375 U/L — AB (ref 38–234)

## 2016-09-25 LAB — C-REACTIVE PROTEIN: CRP: 11.8 mg/dL — AB (ref <1.0)

## 2016-09-25 MED ORDER — IOPAMIDOL (ISOVUE-300) INJECTION 61%
INTRAVENOUS | Status: AC
  Administered 2016-09-25: 100 mL
  Filled 2016-09-25: qty 100

## 2016-09-25 NOTE — Progress Notes (Signed)
PROGRESS NOTE    Jessica Knox  HWE:[id number] DOB: Mar 20, 1966 DOA: 09/20/2016 PCP: Harrison Mons, PA-C   Brief Narrative: 50 y.o. female with hypertension, SVT, diabetes mellitus type 2, hyperlipidemia and hypothyroidism has been experiencing increasing peripheral edema involving the upper and lower extremity and abdominal distention. Patient also has been having severe pain all extremities. Patient has been recently placed on Lipitor last month which patient discontinued after the onset of above symptoms 2 weeks ago. Patient's metformin dose was doubled last month.  Assessment & Plan:   # Peripheral edema, erythematous rash and elevated LFTs (transaminitis): Exact etiology unknown. -Patient  has elevated CK level of 9149 on admission, elevated TSH level of 52. -transaminitis likely due to rahbdomyolysis, enzymes trending down.  -Patient had elevated lactic acid on admission. Also has elevated CRP and sedimentation level. Repeat CRP and ESR also elevated.  -HIV negative, acute hepatitis panel unremarkable, anti-DNA antibody negative, rheumatoid factor negative. ANA level negative -continue empiric antibiotics for possible cellulitis with clindamycin. No improvement in rash therefore I doubt if this is cellulitis -Doppler ultrasound of  upper and lower extremities negative for DVTs. Patient has good distal dorsalis pedis pulse. Warm to touch. No sign of compartment syndrome. -Since there is no clinical improvement, I am ordering CT scan of lower extremities. I discussed with Dr. Lorin Mercy from Orthopedics for the consult. -supportive care, leg elevation and pain management.  -PT,OT evaluation and treatment.  # Abdomen distention: CT abdomen pelvis showed mild hepatomegaly with hepatic steatosis. No acute inflammatory changes.  # Nontraumatic rhabdomyolysis: Exact etiology unknown. May be related with recent use of statin and in the setting of hypothyroidism. Patient has normal kidney function.  CK level trending down to 4375 .  -off IVF. Pt has good oral intake -monitor lab  # Hypokalemia: improved now  # Hypothyroidism: TSH level  elevated to 52 with low free T4. Increased synthroid dose to 200 mcg. Patient reported that she was taking Synthroid at home regularly and never missed a dose.  # Essential hypertension: Blood pressure acceptable. Continue current medications.    #Type 2 diabetes in obese patient: Continue sliding scale.  #History of supraventricular tachycardia: On Cardizem and metoprolol.  PT/OT eval  DVT prophylaxis: lovenox sq Code Status: Full code Family Communication: Discussed with the patient and her husband at bedside. I spent about 25 periods only during discussion. Disposition Plan: Likely discharge home in 1-2 days depending on clinical improvement.  Consultants:   None  Procedures: None Antimicrobials: Started clindamycin on 11/17.  Subjective: Patient was seen and examined at bedside. No improvement in lower extremity pain and swelling. Patient reported generalized weakness. Denied headache, dizziness, nausea, vomiting, chest pain, shortness of breath, cough, abdominal pain. No dysuria, urgency, diarrhea or constipation. Continues to complain of extremities pain mostly on right lower extremity. Patient's husband at bedside. Discussed at length.  Objective: Vitals:   09/24/16 1000 09/24/16 1300 09/24/16 2243 09/25/16 0640  BP: 132/72 122/71 (!) 142/84 126/84  Pulse: 81 75 86 84  Resp:  '16 18 17  ' Temp:  98 F (36.7 C) 98.5 F (36.9 C) 97.8 F (36.6 C)  TempSrc:  Oral Oral Oral  SpO2:  97% 99% 94%  Weight:      Height:       No intake or output data in the 24 hours ending 09/25/16 1146 Filed Weights   09/21/16 0008 09/21/16 0500  Weight: 74.8 kg (165 lb) 75.1 kg (165 lb 9 oz)    Examination:  General exam: Sitting on bed and later walked in the room. Not in distress. Respiratory system: Clear bilateral, no wheezing or  crackle Cardiovascular system: Regular rate rhythm, S1-S2 normal. No murmur appreciated Gastrointestinal system: Abdomen soft, nontender, nondistended. Bowel sound positive Central nervous system: Alert, awake, nonfocal neurological exam. Extremities: Bilateral lower and upper extremities edema and erythematous rash on right lower extremity. Warm to touch. Distal dorsalis pedis pulse palpable in both lower extremities. Skin: erythematous macular rash mostly on right lower extremity. Psychiatry: Judgement and insight appear normal. Mood & affect appropriate.     Data Reviewed: I have personally reviewed following labs and imaging studies  CBC:  Recent Labs Lab 09/20/16 1411 09/21/16 0524 09/22/16 0435  WBC 9.9 9.7 10.8*  NEUTROABS 7.7 7.2 7.9*  HGB 12.0 12.3 12.9  HCT 35.9* 36.8 38.9  MCV 93.2 93.4 94.4  PLT 291 279 366   Basic Metabolic Panel:  Recent Labs Lab 09/20/16 1411 09/21/16 0524 09/22/16 0435 09/23/16 0538 09/24/16 0257  NA 130* 131* 132* 135 135  K 3.7 3.6 4.3 3.4* 4.0  CL 90* 91* 92* 98* 101  CO2 '27 24 29 29 25  ' GLUCOSE 259* 190* 205* 177* 130*  BUN 9 6 5* 6 6  CREATININE 0.68 0.68 0.76 0.68 0.74  CALCIUM 9.3 8.5* 8.6* 8.1* 7.8*  MG 1.6*  --   --   --   --    GFR: Estimated Creatinine Clearance: 77.8 mL/min (by C-G formula based on SCr of 0.74 mg/dL). Liver Function Tests:  Recent Labs Lab 09/20/16 1411 09/21/16 0524 09/22/16 0435 09/23/16 0538 09/24/16 0257  AST 199* 203* 256* 170* 143*  ALT 141* 127* 144* 110* 90*  ALKPHOS 118 109 142* 130* 109  BILITOT 0.8 0.8 1.0 0.5 0.2*  PROT 7.8 6.9 7.7 6.9 6.3*  ALBUMIN 4.3 4.0 3.9 3.4* 3.4*   No results for input(s): LIPASE, AMYLASE in the last 168 hours. No results for input(s): AMMONIA in the last 168 hours. Coagulation Profile:  Recent Labs Lab 09/23/16 0538  INR 1.10   Cardiac Enzymes:  Recent Labs Lab 09/21/16 0524 09/22/16 0435 09/23/16 0538 09/24/16 0805 09/25/16 0411  CKTOTAL  9,149* 9,600* 7,113* 5,653* 4,375*   BNP (last 3 results) No results for input(s): PROBNP in the last 8760 hours. HbA1C: No results for input(s): HGBA1C in the last 72 hours. CBG:  Recent Labs Lab 09/24/16 0633 09/24/16 1134 09/24/16 1629 09/24/16 2241 09/25/16 1113  GLUCAP 165* 152* 172* 181* 172*   Lipid Profile: No results for input(s): CHOL, HDL, LDLCALC, TRIG, CHOLHDL, LDLDIRECT in the last 72 hours. Thyroid Function Tests: No results for input(s): TSH, T4TOTAL, FREET4, T3FREE, THYROIDAB in the last 72 hours. Anemia Panel: No results for input(s): VITAMINB12, FOLATE, FERRITIN, TIBC, IRON, RETICCTPCT in the last 72 hours. Sepsis Labs:  Recent Labs Lab 09/20/16 1432 09/20/16 1822 09/21/16 0524  LATICACIDVEN 2.88* 1.81 2.1*    Recent Results (from the past 240 hour(s))  MRSA PCR Screening     Status: None   Collection Time: 09/21/16  1:22 AM  Result Value Ref Range Status   MRSA by PCR NEGATIVE NEGATIVE Final    Comment:        The GeneXpert MRSA Assay (FDA approved for NASAL specimens only), is one component of a comprehensive MRSA colonization surveillance program. It is not intended to diagnose MRSA infection nor to guide or monitor treatment for MRSA infections.          Radiology Studies: Dg Chest  Port 1 View  Result Date: 09/24/2016 CLINICAL DATA:  PICC line placement EXAM: PORTABLE CHEST 1 VIEW COMPARISON:  Chest radiograph 08/15/2016 FINDINGS: A left-sided approach PICC line tip overlies the lower SVC. No focal airspace disease or pulmonary edema. No pneumothorax. IMPRESSION: PICC line tip overlying the lower SVC. Electronically Signed   By: Ulyses Jarred M.D.   On: 09/24/2016 22:42        Scheduled Meds: . benzonatate  100 mg Oral Q8H  . buPROPion  150 mg Oral BID  . clindamycin (CLEOCIN) IV  600 mg Intravenous Q8H  . diltiazem  180 mg Oral Daily  . enoxaparin (LOVENOX) injection  40 mg Subcutaneous Q24H  . insulin aspart  0-9 Units  Subcutaneous TID WC  . irbesartan  150 mg Oral Daily  . levothyroxine  200 mcg Oral QAC breakfast  . metoprolol  50 mg Oral BID  . pantoprazole  40 mg Oral Daily   Continuous Infusions:    LOS: 3 days    Dron Tanna Furry, MD [facility] Pager [phone]  If 7PM-7AM, please contact night-coverage [email or web address] Password [facility] 09/25/2016, 11:46 AM

## 2016-09-25 NOTE — Plan of Care (Signed)
Problem: Safety: Goal: Ability to remain free from injury will improve Outcome: Progressing No fall or injury noted this shift  Problem: Pain Managment: Goal: General experience of comfort will improve Outcome: Progressing Medicated twice this shift , resting in the bed with eyes closed at this time, no C/O pain  Problem: Physical Regulation: Goal: Will remain free from infection Outcome: Progressing No S/S of infection noted, VS WNL  Problem: Skin Integrity: Goal: Risk for impaired skin integrity will decrease Outcome: Not Progressing RLE remains red and swollen  Problem: Activity: Goal: Risk for activity intolerance will decrease Outcome: Progressing Tolerates activity well, OOB to BR with one assistance  Problem: Bowel/Gastric: Goal: Will not experience complications related to bowel motility Outcome: Progressing No bowel issues reported

## 2016-09-25 NOTE — Care Management Note (Signed)
Case Management Note  Patient Details  Name: Jessica Knox MRN: [id number] Date of Birth: 08-10-1966  Subjective/Objective:    50 yr old female admitted with lower extremity cellulitis.                Action/Plan: Case manager spoke with patient concerning Home health and DME needs. Choice was offered. Referral was called to Janeice RobinsonKaren Nusbaumm, [facility] Liaison. Patient will have family support at discharge.    Expected Discharge Date:   09/26/16               Expected Discharge Plan:  Home w Home Health Services  In-House Referral:  NA  Discharge planning Services  CM Consult  Post Acute Care Choice:  Durable Medical Equipment, Home Health Choice offered to:  Patient  DME Arranged:  Walker rolling DME Agency:  [facility]  [facility]  Status of Service:  Completed, signed off  If discussed at Long Length of Stay Meetings, dates discussed:    Additional Comments:  [name], Amariah Kierstead Naomi, RN 09/25/2016, 4:17 PM

## 2016-09-25 NOTE — Progress Notes (Signed)
Physical Therapy Treatment Patient Details Name: Jessica Knox MRN: [id number] DOB: 1965-11-29 Today's Date: 09/25/2016    History of Present Illness Pt is a 50 y.o. female who presents with peripheral edema and abdominal distention and severe pain in all extremities (right lower leg the worst). Dx with peripherial edema, erythematous rash, and elevated LFTs (transaminitis), etiology unclear, medical workup pending.  Pt with negative DVT testing, CT of abdomen/pelvis with mild hepatomegaly and hepatic stenosis, non traumatic rhabdomyolysis (suspect might be related to recent statin use, CK levels are trending down), hypokalemia, and hypothyroidism.  Pt has a past medical history significant for hypertension, SVT, diabetes mellitus type 2, hyperlipidemia, and hypothyroidism. '    PT Comments    Treatment limited by pt being transported to CT.  We were able to progress to more LE exercises, preform PROM/AAROM to her right foot, and retro grade massage to right foot and ankle.  Pt's right lower leg redness seems to have spread compared to yesterday.  PT will continue to follow acutely and discussed that we need to make sure she can get up the 6 steps to get into her house before she discharges, so she knows she will have to mobilize before going home (no timeline on d/c yet).  PT will continue to follow acutely.   Follow Up Recommendations  Home health PT;Supervision for mobility/OOB     Equipment Recommendations  Rolling walker with 5" wheels    Recommendations for Other Services   NA     Precautions / Restrictions Precautions Precautions: Fall Precaution Comments: due to not really being able to put weight through her right leg (or even have it dependent).  Restrictions Weight Bearing Restrictions: Yes    Mobility  Bed Mobility               General bed mobility comments: NT transport came to get her for CT scan            Cognition Arousal/Alertness:  Awake/alert Behavior During Therapy: [facility]FL for tasks assessed/performed Overall Cognitive Status: Within Functional Limits for tasks assessed                      Exercises Total Joint Exercises Ankle Circles/Pumps: AROM;PROM;Right;15 reps;Other (comment) (30 second holds, knee straight and knee bent) Quad Sets: AROM;Right;10 reps;Other (comment) (with ankle DF while holding quad set 5 seconds) Other Exercises Other Exercises: Retro grad massage to foot and ankle to help decrease edema.     General Comments General comments (skin integrity, edema, etc.): I discussed that we will need to practice stairs for home entry before she is d/c from the hospital and she reported if needed her son could build her a ramp.        Pertinent Vitals/Pain Pain Assessment: Faces Faces Pain Scale: Hurts whole lot Pain Location: right lower leg wtih ROM Pain Descriptors / Indicators: Aching;Burning Pain Intervention(s): Limited activity within patient's tolerance;Monitored during session;Repositioned           PT Goals (current goals can now be found in the care plan section) Acute Rehab PT Goals Patient Stated Goal: to figure out why this happended, decrease pain, and return to independence (she is also worried about surgery and losing her leg--no mention that this is a risk).  Progress towards PT goals: Progressing toward goals    Frequency    Min 3X/week      PT Plan Current plan remains appropriate       End of Session  Activity Tolerance: No increased pain Patient left: in bed;with call bell/phone within reach;with family/visitor present;Other (comment) (transporter there to take her to CT)     Time: 1219-1236 PT Time Calculation (min) (ACUTE ONLY): 17 min  Charges:  $Therapeutic Exercise: 8-22 mins                     Madaleine Simmon B. Adrielle Polakowski, PT, DPT [phone]   09/25/2016, 2:19 PM

## 2016-09-26 ENCOUNTER — Inpatient Hospital Stay (HOSPITAL_COMMUNITY): Payer: BLUE CROSS/BLUE SHIELD

## 2016-09-26 DIAGNOSIS — T79A21A Traumatic compartment syndrome of right lower extremity, initial encounter: Secondary | ICD-10-CM

## 2016-09-26 DIAGNOSIS — X58XXXA Exposure to other specified factors, initial encounter: Secondary | ICD-10-CM

## 2016-09-26 DIAGNOSIS — E034 Atrophy of thyroid (acquired): Secondary | ICD-10-CM

## 2016-09-26 DIAGNOSIS — M609 Myositis, unspecified: Secondary | ICD-10-CM

## 2016-09-26 DIAGNOSIS — B351 Tinea unguium: Secondary | ICD-10-CM

## 2016-09-26 DIAGNOSIS — B9689 Other specified bacterial agents as the cause of diseases classified elsewhere: Secondary | ICD-10-CM

## 2016-09-26 DIAGNOSIS — F17210 Nicotine dependence, cigarettes, uncomplicated: Secondary | ICD-10-CM

## 2016-09-26 DIAGNOSIS — R262 Difficulty in walking, not elsewhere classified: Secondary | ICD-10-CM

## 2016-09-26 DIAGNOSIS — Z888 Allergy status to other drugs, medicaments and biological substances status: Secondary | ICD-10-CM

## 2016-09-26 DIAGNOSIS — M79661 Pain in right lower leg: Secondary | ICD-10-CM

## 2016-09-26 DIAGNOSIS — Z881 Allergy status to other antibiotic agents status: Secondary | ICD-10-CM

## 2016-09-26 DIAGNOSIS — M25673 Stiffness of unspecified ankle, not elsewhere classified: Secondary | ICD-10-CM

## 2016-09-26 DIAGNOSIS — L03115 Cellulitis of right lower limb: Secondary | ICD-10-CM

## 2016-09-26 DIAGNOSIS — T79A22A Traumatic compartment syndrome of left lower extremity, initial encounter: Secondary | ICD-10-CM

## 2016-09-26 DIAGNOSIS — Z885 Allergy status to narcotic agent status: Secondary | ICD-10-CM

## 2016-09-26 DIAGNOSIS — R109 Unspecified abdominal pain: Secondary | ICD-10-CM

## 2016-09-26 LAB — GLUCOSE, CAPILLARY
GLUCOSE-CAPILLARY: 133 mg/dL — AB (ref 65–99)
GLUCOSE-CAPILLARY: 130 mg/dL — AB (ref 65–99)
Glucose-Capillary: 149 mg/dL — ABNORMAL HIGH (ref 65–99)
Glucose-Capillary: 160 mg/dL — ABNORMAL HIGH (ref 65–99)

## 2016-09-26 LAB — CBC
HCT: 31.7 % — ABNORMAL LOW (ref 36.0–46.0)
Hemoglobin: 10.5 g/dL — ABNORMAL LOW (ref 12.0–15.0)
MCH: 31.6 pg (ref 26.0–34.0)
MCHC: 33.1 g/dL (ref 30.0–36.0)
MCV: 95.5 fL (ref 78.0–100.0)
Platelets: 310 K/uL (ref 150–400)
RBC: 3.32 MIL/uL — ABNORMAL LOW (ref 3.87–5.11)
RDW: 13.8 % (ref 11.5–15.5)
WBC: 9 K/uL (ref 4.0–10.5)

## 2016-09-26 LAB — CK: CK TOTAL: 3648 U/L — AB (ref 38–234)

## 2016-09-26 MED ORDER — CEPHALEXIN 500 MG PO CAPS
500.0000 mg | ORAL_CAPSULE | Freq: Four times a day (QID) | ORAL | Status: DC
  Administered 2016-09-26 – 2016-09-27 (×4): 500 mg via ORAL
  Filled 2016-09-26 (×4): qty 1

## 2016-09-26 NOTE — Progress Notes (Signed)
OT Cancellation Note  Patient Details Name: Mathews ArgyleDana Malpass MRN: [id number] DOB: October 25, 1966   Cancelled Treatment:    Reason Eval/Treat Not Completed: Patient at procedure or test/ unavailable. Pt completing consult with MD at this time. OT will check back as able.  [address]Nicolas Sisler A Marchelle Rinella, OTR/L [phone] 09/26/2016, 3:23 PM

## 2016-09-26 NOTE — Progress Notes (Signed)
Called MRI to let them know patient has stat MRI.  They are aware, states only one machine is working but they will be up shortly to get patient.

## 2016-09-26 NOTE — Progress Notes (Signed)
Left message with Zonia KiefJames Owens PA to let him know that patient's MRI result were posted. Also let Dr. Waymon AmatoHongalgi know.

## 2016-09-26 NOTE — Progress Notes (Signed)
Physical Therapy Treatment Patient Details Name: Jessica ArgyleDana Malpass MRN: [id number] DOB: [birth year] Today's Date: 09/26/2016    History of Present Illness Pt is a 50 y.o. female who presents with peripheral edema and abdominal distention and severe pain in all extremities (right lower leg the worst). Dx with peripherial edema, erythematous rash, and elevated LFTs (transaminitis), etiology unclear, medical workup pending.  Pt with negative DVT testing, CT of abdomen/pelvis with mild hepatomegaly and hepatic stenosis, non traumatic rhabdomyolysis (suspect might be related to recent statin use, CK levels are trending down), hypokalemia, and hypothyroidism.  Pt has a past medical history significant for hypertension, SVT, diabetes mellitus type 2, hyperlipidemia, and hypothyroidism. '    PT Comments    Pt felt yesterday's treatment helped significantly with her pain.  She reports mobilizing around the room independently with the IV pole and her walker for home was delivered.  She will need to practice stairs once d/c day is determined.  Re-educated her caregiver on retro massage (avoid near the red areas), and ankle DF stretches performed.    Follow Up Recommendations  Home health PT;Supervision for mobility/OOB     Equipment Recommendations  Rolling walker with 5" wheels    Recommendations for Other Services   NA     Precautions / Restrictions Precautions Precautions: Fall Precaution Comments: due to not really being able to put weight through her right leg (or even have it dependent).  Restrictions Weight Bearing Restrictions: No          Cognition Arousal/Alertness: Awake/alert Behavior During Therapy: WFL for tasks assessed/performed Overall Cognitive Status: Within Functional Limits for tasks assessed                      Exercises Total Joint Exercises Ankle Circles/Pumps: PROM;AAROM;Right;10 reps;Other (comment) (with knee flexed and knee extended x 30 second holds  each) Quad Sets: AROM;Right;10 reps;Other (comment) (with AAROM DF with each quad set) Straight Leg Raises: AROM;Right;10 reps;Other (comment) (with cues to DF ankle as much as she can) Other Exercises Other Exercises: Retro grad massage to foot and ankle to help decrease edema.     General Comments General comments (skin integrity, edema, etc.): I continue to think that the red area on her lower leg is continuing to spread, it is now around her lateral ankle.  No borders have been drawn. Pt reports that the exercises and retrograde massage really helped yesterday.  Re-educated significant other re: retro grade massage of the foot and to avoid the red areas of her leg as massage would irritate that area more.       Pertinent Vitals/Pain Pain Assessment: Faces Faces Pain Scale: Hurts whole lot Pain Location: right lower leg is sensative to touch.  Pain Descriptors / Indicators: Grimacing;Guarding Pain Intervention(s): Limited activity within patient's tolerance;Monitored during session;Repositioned           PT Goals (current goals can now be found in the care plan section) Acute Rehab PT Goals Patient Stated Goal: to go home tomorrow if she can Progress towards PT goals: Progressing toward goals    Frequency    Min 3X/week      PT Plan Current plan remains appropriate       End of Session   Activity Tolerance: No increased pain Patient left: in bed;with call bell/phone within reach;with family/visitor present     Time: [id number] PT Time Calculation (min) (ACUTE ONLY): 17 min  Charges:  $Therapeutic Exercise: 8-22 mins  Rollene Rotundaebecca B. Alvilda Mckenna, PT, DPT [phone]   09/26/2016, 4:00 PM

## 2016-09-26 NOTE — Progress Notes (Addendum)
PROGRESS NOTE    Jessica Knox  ONG:[id number] DOB: 1966/10/03 DOA: 09/20/2016 PCP: Harrison Mons, PA-C  Primary cardiologist: Dr. Nadyne Coombes   Brief Narrative: 50 y.o. female with hypertension, SVT, diabetes mellitus type 2, hyperlipidemia and hypothyroidism has been experiencing increasing peripheral edema involving the upper and lower extremity and abdominal distention. Patient also has been having severe pain all extremities. Patient has been recently placed on Lipitor last month which patient discontinued after the onset of above symptoms 2 weeks ago. Patient's metformin dose was doubled last month. She is a retired Therapist, sports. Denies recent travel out of state or insect bites. Still has some generalized aches and pains but upper extremity pain and weakness have significantly improved. Now her symptoms are mostly lateralized right lower extremity where there is persistent swelling and pain but improved redness. Orthopedics and ID consulted.  Assessment & Plan:   # Peripheral edema, erythematous rash and elevated LFTs (transaminitis): Exact etiology unknown. -Patient  has elevated CK level of 9149 on admission, elevated TSH level of 52. -transaminitis likely due to rahbdomyolysis, enzymes trending down. CK 9600 on admission > 3600 on 11/22 -Patient had elevated lactic acid on admission. Also has elevated CRP and sedimentation level. Repeat CRP and ESR also elevated.  -HIV negative, acute hepatitis panel unremarkable, anti-DNA antibody negative, rheumatoid factor negative. ANA level negative -Had been on empiric antibiotic/IV clindamycin since 11/17 for empiric cellulitis. Today's clinical picture is not consistent with cellulitis. ID consulted on 11/22 and recommend changing clindamycin to cephalexin to complete total 10 day course for streptococcal coverage. Discussed with Dr. Baxter Flattery -Doppler ultrasound of upper and lower extremities negative for DVTs. Patient has good distal dorsalis pedis pulse. Warm to  touch. No sign of compartment syndrome. -supportive care, leg elevation and pain management.  -PT,OT evaluation and treatment. - CT bilateral lower extremities 09/25/16 suggested mild cellulitis (clinically not the case) and mild myositis of right leg >left leg. Orthopedic consultation appreciated. They requested MRI of right lower extremity which show findings consistent with right lower leg peroneal muscle myositis, peroneal tenosynovitis and possible intramuscular hematoma. Report suggests excluding lateral compartment syndrome. No evidence of soft tissue abscess or osteomyelitis. ?DC Lovenox. - Discussed with Dr. Lorin Mercy, Orthopedics who is reviewing the case with one of his orthopedic colleagues. He suspects that patient may have had compartment syndrome earlier on admission related to rhabdomyolysis  # Abdomen distention: CT abdomen pelvis showed mild hepatomegaly with hepatic steatosis. No acute inflammatory changes. Denies any GI symptoms at this time.  # Nontraumatic rhabdomyolysis: Exact etiology unknown. May be related with recent use of statin and in the setting of hypothyroidism. Patient has normal kidney function. CK level trending down from 9600> 3600 .  -off IVF. Pt has good oral intake. Mildly elevated LFTs are likely secondary to this and are improving. -monitor lab  # Hypokalemia: Replaced  # Hypothyroidism: TSH level  elevated to 52 with low free T4. Increased synthroid dose from 175 to 200 mcg. Patient reported that she was taking Synthroid at home regularly and never missed a dose. Follow-up TSH in 4-6 weeks.  # Essential hypertension: Blood pressure acceptable. Continue current medications.    #Type 2 diabetes in obese patient: Continue sliding scale. Reasonable inpatient control. A1c 8.6 suggesting poor outpatient control.  #History of supraventricular tachycardia: On Cardizem and metoprolol.  Anemia: Unclear etiology. Although hematoma reported in right leg on MRI, does  not seem significant drop in hemoglobin by approximately 2 g. Follow CBC in a.m.  DVT prophylaxis: lovenox sq Code Status: Full code Family Communication: No family at bedside.  Disposition Plan: Likely discharge home in 1-2 days depending on clinical improvement.  Consultants:   Orthopedics  Infectious disease  Procedures:   None  Antimicrobials:   Started clindamycin on 11/17, Discontinued 11/22.  Subjective:  Seen this morning after orthopedics consultation. States that upper extremity soreness and weakness have significantly improved. Still has generalized aches and pains. Now pain and swelling mostly localized to right lower extremity. No chest pain, dyspnea or palpitations reported. States that she took the Lipitor for a month and has been off of it for a month now.  Objective: Vitals:   09/25/16 2100 09/26/16 0600 09/26/16 0959 09/26/16 1249  BP: 119/76 123/67 123/73 113/63  Pulse: 82 76 79 77  Resp: '16 16  16  ' Temp: 98.1 F (36.7 C) 98.2 F (36.8 C)  98.5 F (36.9 C)  TempSrc: Oral Oral  Oral  SpO2: 97% 96% 96% 96%  Weight:      Height:        Intake/Output Summary (Last 24 hours) at 09/26/16 1555 Last data filed at 09/25/16 1700  Gross per 24 hour  Intake              240 ml  Output                0 ml  Net              240 ml   Filed Weights   09/21/16 0008 09/21/16 0500  Weight: 74.8 kg (165 lb) 75.1 kg (165 lb 9 oz)    Examination:  General exam: Pleasant young female lying comfortably supine in bed.  Respiratory system: Clear bilateral, no wheezing or crackle. No increased work of breathing.  Cardiovascular system: Regular rate rhythm, S1-S2 normal. No murmur appreciated Gastrointestinal system: Abdomen soft, nontender, nondistended. Bowel sound positive Central nervous system: Alert, awake, nonfocal neurological exam. Extremities: Symmetric 5 x 5 power in all extremities. Orthopedics comments about foot drop in right lower extremity but did  not appreciate that-weakness and dorsiflexion seems to be related to her pain and leg swelling. Fairly focal area of faint erythematous but nontender and non-warm area over right lower anterior lateral leg.  Skin: erythematous macular rash mostly on right lower extremity. Psychiatry: Judgement and insight appear normal. Mood & affect appropriate.     Data Reviewed: I have personally reviewed following labs and imaging studies  CBC:  Recent Labs Lab 09/20/16 1411 09/21/16 0524 09/22/16 0435 09/26/16 0503  WBC 9.9 9.7 10.8* 9.0  NEUTROABS 7.7 7.2 7.9*  --   HGB 12.0 12.3 12.9 10.5*  HCT 35.9* 36.8 38.9 31.7*  MCV 93.2 93.4 94.4 95.5  PLT 291 279 299 267   Basic Metabolic Panel:  Recent Labs Lab 09/20/16 1411 09/21/16 0524 09/22/16 0435 09/23/16 0538 09/24/16 0257  NA 130* 131* 132* 135 135  K 3.7 3.6 4.3 3.4* 4.0  CL 90* 91* 92* 98* 101  CO2 '27 24 29 29 25  ' GLUCOSE 259* 190* 205* 177* 130*  BUN 9 6 5* 6 6  CREATININE 0.68 0.68 0.76 0.68 0.74  CALCIUM 9.3 8.5* 8.6* 8.1* 7.8*  MG 1.6*  --   --   --   --    GFR: Estimated Creatinine Clearance: 77.8 mL/min (by C-G formula based on SCr of 0.74 mg/dL). Liver Function Tests:  Recent Labs Lab 09/20/16 1411 09/21/16 0524 09/22/16 0435 09/23/16 0538 09/24/16 0257  AST 199* 203* 256* 170* 143*  ALT 141* 127* 144* 110* 90*  ALKPHOS 118 109 142* 130* 109  BILITOT 0.8 0.8 1.0 0.5 0.2*  PROT 7.8 6.9 7.7 6.9 6.3*  ALBUMIN 4.3 4.0 3.9 3.4* 3.4*   No results for input(s): LIPASE, AMYLASE in the last 168 hours. No results for input(s): AMMONIA in the last 168 hours. Coagulation Profile:  Recent Labs Lab 09/23/16 0538  INR 1.10   Cardiac Enzymes:  Recent Labs Lab 09/22/16 0435 09/23/16 0538 09/24/16 0805 09/25/16 0411 09/26/16 0503  CKTOTAL 9,600* 7,113* 5,653* 4,375* 3,648*   BNP (last 3 results) No results for input(s): PROBNP in the last 8760 hours. HbA1C: No results for input(s): HGBA1C in the last 72  hours. CBG:  Recent Labs Lab 09/24/16 2241 09/25/16 1113 09/25/16 2118 09/26/16 0650 09/26/16 1122  GLUCAP 181* 172* 134* 149* 160*   Lipid Profile: No results for input(s): CHOL, HDL, LDLCALC, TRIG, CHOLHDL, LDLDIRECT in the last 72 hours. Thyroid Function Tests: No results for input(s): TSH, T4TOTAL, FREET4, T3FREE, THYROIDAB in the last 72 hours. Anemia Panel: No results for input(s): VITAMINB12, FOLATE, FERRITIN, TIBC, IRON, RETICCTPCT in the last 72 hours. Sepsis Labs:  Recent Labs Lab 09/20/16 1432 09/20/16 1822 09/21/16 0524  LATICACIDVEN 2.88* 1.81 2.1*    Recent Results (from the past 240 hour(s))  MRSA PCR Screening     Status: None   Collection Time: 09/21/16  1:22 AM  Result Value Ref Range Status   MRSA by PCR NEGATIVE NEGATIVE Final    Comment:        The GeneXpert MRSA Assay (FDA approved for NASAL specimens only), is one component of a comprehensive MRSA colonization surveillance program. It is not intended to diagnose MRSA infection nor to guide or monitor treatment for MRSA infections.          Radiology Studies: Mr Tibia Fibula Right Wo Contrast  Result Date: 09/26/2016 CLINICAL DATA:  Right lower leg erythema and tenderness. No acute injury. EXAM: MRI OF LOWER RIGHT EXTREMITY WITHOUT CONTRAST TECHNIQUE: Multiplanar, multisequence MR imaging of the right lower leg was performed. No intravenous contrast was administered. COMPARISON:  CT 09/25/2016 FINDINGS: Examination was performed using the body coil. Both lower legs are included on the axial and coronal images. Bones/Joint/Cartilage No evidence of acute fracture, dislocation, bone destruction or marrow edema in either lower leg. No significant arthropathic changes or effusions demonstrated at the right knee or ankle. Ligaments Not of primary relevance to the exam/ indication. The cruciate ligaments appear intact at the right knee. Muscles and Tendons As demonstrated on recent CT, there is  relative enlargement of the muscles of the right peroneal compartment. These muscles demonstrate heterogeneous T2 hyperintensity as well as mildly increased T1 signal. On coronal images 14 and 15 of series 3, there is an oval, 4.8 cm of focally altered signal intensity which may reflect a hematoma. There is mild peroneal tenosynovitis at the right ankle. There is minimal T2 hyperintensity within the anterior and deep posterior compartment muscles without significant enlargement. The superficial posterior compartment muscles appear unremarkable. Soft tissues Subcutaneous edema is present within both lower legs, greater on the right. There are no focal fluid collections. No vascular abnormalities are seen on noncontrast imaging. IMPRESSION: 1. Findings are similar to those demonstrated on CT from yesterday. The right lower leg peroneal muscles are enlarged with heterogeneous T2 and T1 hyperintensity, most consistent with myositis. Associated peroneal tenosynovitis and possible intramuscular hematoma. Lateral compartment compartment syndrome should be  excluded clinically. 2. Lower leg cellulitis, right-greater-than-left. 3. No evidence of soft tissue abscess or osteomyelitis. Electronically Signed   By: Richardean Sale M.D.   On: 09/26/2016 12:40   Dg Chest Port 1 View  Result Date: 09/24/2016 CLINICAL DATA:  PICC line placement EXAM: PORTABLE CHEST 1 VIEW COMPARISON:  Chest radiograph 08/15/2016 FINDINGS: A left-sided approach PICC line tip overlies the lower SVC. No focal airspace disease or pulmonary edema. No pneumothorax. IMPRESSION: PICC line tip overlying the lower SVC. Electronically Signed   By: Ulyses Jarred M.D.   On: 09/24/2016 22:42   Ct Extrem Lower W Cm Bil  Result Date: 09/25/2016 CLINICAL DATA:  Bilateral lower leg swelling. Bilateral lower leg pain. EXAM: CT OF THE LOWER BILATERAL EXTREMITY WITH CONTRAST TECHNIQUE: Multidetector CT imaging of the lower bilateral extremity was performed  according to the standard protocol following intravenous contrast administration. COMPARISON:  None. CONTRAST:  133m ISOVUE-300 IOPAMIDOL (ISOVUE-300) INJECTION 61% FINDINGS: Bones/Joint/Cartilage No fracture or dislocation. Normal alignment. Bilateral hip joint spaces are maintained. Bilateral knee joint spaces are maintained. Bilateral ankle joint spaces are maintained. Bilateral subtalar joints are normal. Mild osteoarthritis of bilateral sacroiliac joints. Small left knee joint effusion. No right knee joint effusion. Ligaments Ligaments are suboptimally evaluated by CT. Medial and lateral collateral ligaments bilaterally and grossly intact. ACL and PCL are grossly intact bilaterally. Muscles and Tendons Mild expansion of the right anterior and peroneal compartment muscles of the right lower leg most concerning for mild myositis. Muscles are otherwise normal bilaterally. Soft tissue No fluid collection or hematoma. No soft tissue mass. Mild soft tissue edema in the subcutaneous fat throughout the right lower extremity most severe in the upper right lower leg. Mild soft tissue edema in the subcutaneous fat of the left lower extremity. IMPRESSION: 1. Mild cellulitis of bilateral lower extremities. Mild expansion of the right anterior and peroneal compartment muscles of the right lower leg most concerning for mild myositis secondary to an infectious or inflammatory etiology. Electronically Signed   By: HKathreen Devoid  On: 09/25/2016 14:46        Scheduled Meds: . benzonatate  100 mg Oral Q8H  . buPROPion  150 mg Oral BID  . cephALEXin  500 mg Oral Q6H  . diltiazem  180 mg Oral Daily  . enoxaparin (LOVENOX) injection  40 mg Subcutaneous Q24H  . insulin aspart  0-9 Units Subcutaneous TID WC  . irbesartan  150 mg Oral Daily  . levothyroxine  200 mcg Oral QAC breakfast  . metoprolol  50 mg Oral BID  . pantoprazole  40 mg Oral Daily   Continuous Infusions:    LOS: 4 days    HONGALGI,ANAND, MD,  FACP, FHM. [facility] Pager [phone] If 7PM-7AM, please contact night-coverage [email or web address] Password [email or web address] [email or web address] Center11/22/2017, 3:55 PM

## 2016-09-26 NOTE — Plan of Care (Signed)
Problem: Safety: Goal: Ability to remain free from injury will improve Outcome: Progressing Safety precations and fall preventions maintained, no fall or injury noted  Problem: Pain Managment: Goal: General experience of comfort will improve Outcome: Progressing Medicated once for pain with moderate relief  Problem: Skin Integrity: Goal: Risk for impaired skin integrity will decrease Outcome: Progressing RLE remains red and swollen  Problem: Tissue Perfusion: Goal: Risk factors for ineffective tissue perfusion will decrease Outcome: Progressing Denies S/S of DVT  Problem: Activity: Goal: Risk for activity intolerance will decrease Outcome: Progressing Ambulating in the room independently and tolerates well  Problem: Bowel/Gastric: Goal: Will not experience complications related to bowel motility Outcome: Progressing No gastric or bowel issues reported

## 2016-09-26 NOTE — Consult Note (Addendum)
Patient ID: Jessica Knox MRN: [id number] DOB/AGE: 01/12/1966 50 y.o.  Admit date: 09/20/2016  Admission Diagnoses:  Active Problems:   Hypothyroidism   Essential hypertension   Diabetes mellitus type 2 in obese (HCC)   Edema   Rash   Transaminitis   Non-traumatic rhabdomyolysis   Leg swelling   HPI: Patient is being seen at the request of hospitalist for ongoing right lower leg swelling, redness pain. Patient states that about 3-1/2-4 weeks ago she did her final mobile of her yard. At the time states that she was wearing sandals on a riding mower. Shortly after that she began having some soreness in her right leg but not too bad. This past weekend she began having sudden increase lower leg swelling redness pain and rash. Not complaining of any pain in the left lower extremity. Patient has a history of chronic low back pain second to lumbar degenerative disc disease but states that this is not an issue at this time. She is at increased pain with ambulation. Since this past Sunday she's been complaining of a right foot drop that she did not have before her admission. Admission labs CRP 12.3, sedimentation rate 68.  Total CK 5 days ago 9149. HIV and hepatitis screen negative. ANA negative and rheumatoid factor within normal limits.  Labs yesterday sedimentation rate 100 and C-reactive protein 11.8  patient currently has a PICC line and clindamycin and started for the cellulitis.  Past Medical History: Past Medical History:  Diagnosis Date  . Diabetes mellitus without complication (HCC)   . GERD (gastroesophageal reflux disease)   . Hypertension   . SVT (supraventricular tachycardia) (HCC)   . Thyroid disease    multinodular thyroid total thyroidectomy    Surgical History: Past Surgical History:  Procedure Laterality Date  . ABDOMINAL HYSTERECTOMY    . CHOLECYSTECTOMY    . RIGHT OOPHORECTOMY  12/06/2012  . RIGHT OOPHORECTOMY Right   . ROBOTIC ASSISTED DIAGNOSTIC LAPAROSCOPY   01/10/2015  . THYROIDECTOMY      Family History: Family History  Problem Relation Age of Onset  . Adopted: Yes  . Family history unknown: Yes    Social History: Social History   Social History  . Marital status: Legally Separated    Spouse name: N/A  . Number of children: N/A  . Years of education: N/A   Occupational History  . Not on file.   Social History Main Topics  . Smoking status: Current Every Day Smoker    Packs/day: 1.00    Years: 30.00    Types: Cigarettes  . Smokeless tobacco: Never Used  . Alcohol use 0.0 oz/week  . Drug use: No  . Sexual activity: Not on file   Other Topics Concern  . Not on file   Social History Narrative   Exercise per patient cleaning her house and working in the yard    Allergies: Atorvastatin; Ciprofloxacin; and Morphine and related  Medications: I have reviewed the patient's current medications.  Vital Signs: Patient Vitals for the past 24 hrs:  BP Temp Temp src Pulse Resp SpO2  09/26/16 0600 123/67 98.2 F (36.8 C) Oral 76 16 96 %  09/25/16 2100 119/76 98.1 F (36.7 C) Oral 82 16 97 %  09/25/16 1243 118/68 98.1 F (36.7 C) Oral 80 16 90 %    Radiology: Ct Abdomen Pelvis W Contrast  Result Date: 09/21/2016 CLINICAL DATA:  50 year old with abdominal pain. History of right oophorectomy, cholecystectomy and hysterectomy. EXAM: CT ABDOMEN AND PELVIS  WITH CONTRAST TECHNIQUE: Multidetector CT imaging of the abdomen and pelvis was performed using the standard protocol following bolus administration of intravenous contrast. CONTRAST:  100mL ISOVUE-300 IOPAMIDOL (ISOVUE-300) INJECTION 61% COMPARISON:  01/26/2015 FINDINGS: Lower chest: Bandlike density at the left lung base is most suggestive for subsegmental atelectasis. No significant pleural effusions. Hepatobiliary: Diffuse low-density in the liver parenchyma is compatible with hepatic steatosis. Focal fat along the medial right hepatic lobe on sequence 2, image 35. Portal  venous system and hepatic veins are patent. The gallbladder has been removed without biliary dilatation. There also appears to be new focal fat along the anterior aspect of the caudate lobe that measures 3.2 x 1.6 cm. There is a vessel extending directly through this focal low-density area in the caudate which is suggestive for focal fat. Liver is also large for size measuring 20 cm in the craniocaudal dimension. Pancreas: Normal appearance of the pancreas without inflammation or duct dilatation. Spleen: Normal appearance of spleen without enlargement. Adrenals/Urinary Tract: Normal adrenal glands. Urinary bladder is decompressed unremarkable. Normal appearance of both kidneys. Stomach/Bowel: Stomach is within normal limits. Appendix appears normal. No evidence of bowel wall thickening, distention, or inflammatory changes. Vascular/Lymphatic: Minimal atherosclerotic disease in the distal abdominal aorta. There is no evidence for aortic aneurysm. No significant lymph node enlargement in the abdomen or pelvis. Reproductive: Status post hysterectomy. No adnexal masses. Again noted are small low-density structures in the vagina region. The largest is on the right side measuring 1.3 x 0.4 cm. This structure has decreased in size and previously measured 2.0 x 1.0 cm. These are probably incidental findings especially since they are getting smaller but could represent Gartner duct cysts based on the location. Other: No free fluid.  No free air. Musculoskeletal: Disc space narrowing at L4-L5 and L5-S1. There is a central disc protrusion at L5-S1. IMPRESSION: Mild hepatomegaly with hepatic steatosis. Areas of focal fat in the liver, most prominent in the caudate lobe. No acute inflammatory changes in the abdomen or pelvis. Disc space disease in lower lumbar spine. Electronically Signed   By: Richarda OverlieAdam  Henn M.D.   On: 09/21/2016 09:58   Dg Chest Port 1 View  Result Date: 09/24/2016 CLINICAL DATA:  PICC line placement EXAM:  PORTABLE CHEST 1 VIEW COMPARISON:  Chest radiograph 08/15/2016 FINDINGS: A left-sided approach PICC line tip overlies the lower SVC. No focal airspace disease or pulmonary edema. No pneumothorax. IMPRESSION: PICC line tip overlying the lower SVC. Electronically Signed   By: Deatra RobinsonKevin  Herman M.D.   On: 09/24/2016 22:42   Ct Extrem Lower W Cm Bil  Result Date: 09/25/2016 CLINICAL DATA:  Bilateral lower leg swelling. Bilateral lower leg pain. EXAM: CT OF THE LOWER BILATERAL EXTREMITY WITH CONTRAST TECHNIQUE: Multidetector CT imaging of the lower bilateral extremity was performed according to the standard protocol following intravenous contrast administration. COMPARISON:  None. CONTRAST:  100mL ISOVUE-300 IOPAMIDOL (ISOVUE-300) INJECTION 61% FINDINGS: Bones/Joint/Cartilage No fracture or dislocation. Normal alignment. Bilateral hip joint spaces are maintained. Bilateral knee joint spaces are maintained. Bilateral ankle joint spaces are maintained. Bilateral subtalar joints are normal. Mild osteoarthritis of bilateral sacroiliac joints. Small left knee joint effusion. No right knee joint effusion. Ligaments Ligaments are suboptimally evaluated by CT. Medial and lateral collateral ligaments bilaterally and grossly intact. ACL and PCL are grossly intact bilaterally. Muscles and Tendons Mild expansion of the right anterior and peroneal compartment muscles of the right lower leg most concerning for mild myositis. Muscles are otherwise normal bilaterally. Soft tissue No fluid  collection or hematoma. No soft tissue mass. Mild soft tissue edema in the subcutaneous fat throughout the right lower extremity most severe in the upper right lower leg. Mild soft tissue edema in the subcutaneous fat of the left lower extremity. IMPRESSION: 1. Mild cellulitis of bilateral lower extremities. Mild expansion of the right anterior and peroneal compartment muscles of the right lower leg most concerning for mild myositis secondary to an  infectious or inflammatory etiology. Electronically Signed   By: Elige Ko   On: 09/25/2016 14:46    Labs:  Recent Labs  09/26/16 0503  WBC 9.0  RBC 3.32*  HCT 31.7*  PLT 310    Recent Labs  09/24/16 0257  NA 135  K 4.0  CL 101  CO2 25  BUN 6  CREATININE 0.74  GLUCOSE 130*  CALCIUM 7.8*   No results for input(s): LABPT, INR in the last 72 hours.  Review of Systems: Review of Systems  Constitutional: Negative for chills and fever.  HENT: Negative.   Respiratory: Negative.   Cardiovascular: Negative.   Musculoskeletal: Positive for back pain and joint pain.  Skin: Positive for rash.  Neurological: Positive for focal weakness (Right ankle).    Physical Exam: Pleasant white female alert and oriented 3 and in no acute distress. Head is normocephalic atraumatic. Cervical spine good range of motion. No respiratory distress. Negative logroll bilateral hips. Negative straight leg raise. Right knee has good range of motion. Knee is nontender. No swelling of the knee or palpable effusion. Ligaments are stable. Right lower leg she does have redness with moderate to marked swelling of her calf. Right calf and compartments are exquisitely tender. She does have right anterior tib and EHL weakness with resistance. Maybe trace right gastroc weakness compared to the left side. Right ankle she has some limitation range of motion due to stiffness. Ankle joint is tender. Swelling of the right foot.  The foot itself is nontender. Left lower leg unremarkable.  Assessment and Plan: Right lower extremity cellulitis. Right foot drop. Question compartment syndrome.  Must r/o RMSF.   Reviewed CT reports with Dr. Ophelia Charter my attending. We'll get stat MRI right lower leg. Also will do lab work for [facility] spotted fever. Would consider infectious disease consult. Continue present care. Discussed treatment plan my attending Dr. Ophelia Charter and he will see patient this afternoon before our  surgery.  Genene Churn. Jeanella Craze For Annell Greening MD [facility] orthopedics [phone]                 Reviewed physical exam, history with patient and her husband at bedside. He had cell phone pictures of the last 5 days of her leg. Initially she had problems with swelling upper extremity. She been on a statin drug stopped it on her own she started having some increased symptoms she also has some recent immunizations. CPK was elevated and now has been coming down from the 9-10,000 range down to 3600. Her right peroneal compartment is very firm and she really hasn't had the amount of pain that would normally be seen with compartment syndrome. She doesn't particularly have pain with toe flexion extension or peroneal stretching with inversion of her ankle. Anterior compartment and posterior compartments are soft. At this point with the CPK being elevated to think she's had some muscle damage and the peroneal compartment. At some point in the future she may require an AFO. I do not think at this late point compartment release would likely to be  helpful. I discussed case with Dr. Roda ShuttersXu is one of my partners who is also here on the floor. He reviewed the patient's history exam. From my standpoint than she could be discharged needs to be off her leg most the time I stop her Lovenox. She can use her foot.foot drop brace at night. She can follow-up with me in the office in one week. She also likely needs to be followed by primary care physician or ID team to repeat her labs and make sure renal function in a week is still good and CPK continues to decrease.

## 2016-09-26 NOTE — Progress Notes (Signed)
PT Cancellation Note  Patient Details Name: Mathews ArgyleDana Malpass MRN: [id number] DOB: Mar 01, 1966   Cancelled Treatment:    Reason Eval/Treat Not Completed: Patient at procedure or test/unavailable.  Pt at MRI.  PT will check back later today as time allows, if not today, Friday 09/28/16.    Thanks,    Rollene Rotundaebecca B. Tashena Ibach, PT, DPT [phone]   09/26/2016, 1:14 PM

## 2016-09-26 NOTE — Consult Note (Addendum)
[facility]  Total days of antibiotics 6        Day 6 clindamycin               Reason for Consult: cellulitis   Referring Physician:   Active Problems:   Hypothyroidism   Essential hypertension   Diabetes mellitus type 2 in obese (HCC)   Edema   Rash   Transaminitis   Non-traumatic rhabdomyolysis   Leg swelling    HPI: Jessica Knox is a 50 y.o. female with HTN, Hypothyroidism, DM, who is admitted on 11/16 with new onset upper extremity bilateral myalgias, lower extremity swelling and pain to all extremities. Previously had been on atorvastatin and increased dose of metformin for roughly a month which she stopped just prior to admission due to concern for rhabdomyelysis . On admit, she was found to have afebrile, CK 7,113, AST 170, ALT 110, cr 0.68. During her admission, her left leg was still increasing painful and started to have development of rash/erythema patch lateral ankle. She was started on clindamycin on 2nd day of hospitalization for which her rash looks slightly improved. Ortho consulted to evaluate for possible compartment syndrome. MRI showed myositis, and possibly hematoma measuring 4.8cm.  She denies any trauma to her leg, though her skin is dry. She had shaved "cheap shaver". Her rash looks improved by pictures though has small abrasion, possibly from shaving. She has onychomycosis to toe nails, including great toe  Past Medical History:  Diagnosis Date  . Diabetes mellitus without complication (HCC)   . GERD (gastroesophageal reflux disease)   . Hypertension   . SVT (supraventricular tachycardia) (HCC)   . Thyroid disease    multinodular thyroid total thyroidectomy    Allergies:  Allergies  Allergen Reactions  . Atorvastatin Swelling and Other (See Comments)    Edema  . Ciprofloxacin Palpitations and Other (See Comments)    Can take in spaced, small doses  . Morphine And Related Itching     MEDICATIONS: . benzonatate  100  mg Oral Q8H  . buPROPion  150 mg Oral BID  . diltiazem  180 mg Oral Daily  . enoxaparin (LOVENOX) injection  40 mg Subcutaneous Q24H  . insulin aspart  0-9 Units Subcutaneous TID WC  . irbesartan  150 mg Oral Daily  . levothyroxine  200 mcg Oral QAC breakfast  . metoprolol  50 mg Oral BID  . pantoprazole  40 mg Oral Daily    Social History  Substance Use Topics  . Smoking status: Current Every Day Smoker    Packs/day: 1.00    Years: 30.00    Types: Cigarettes  . Smokeless tobacco: Never Used  . Alcohol use 0.0 oz/week    Family History  Problem Relation Age of Onset  . Adopted: Yes  . Family history unknown: Yes    Review of Systems  Constitutional: Negative for fever, chills, diaphoresis, activity change, appetite change, fatigue and unexpected weight change.  HENT: Negative for congestion, sore throat, rhinorrhea, sneezing, trouble swallowing and sinus pressure.  Eyes: Negative for photophobia and visual disturbance.  Respiratory: Negative for cough, chest tightness, shortness of breath, wheezing and stridor.  Cardiovascular: Negative for chest pain, palpitations and leg swelling.  Gastrointestinal: Negative for nausea, vomiting, abdominal pain, diarrhea, constipation, blood in stool, abdominal distention and anal bleeding.  Genitourinary: Negative for dysuria, hematuria, flank pain and difficulty urinating.  Musculoskeletal: + right leg pain and swelling Skin: + rash of right leg Neurological: Negative  for dizziness, tremors, weakness and light-headedness.  Hematological: Negative for adenopathy. Does not bruise/bleed easily.  Psychiatric/Behavioral: Negative for behavioral problems, confusion, sleep disturbance, dysphoric mood, decreased concentration and agitation.     OBJECTIVE: Temp:  [98.1 F (36.7 C)-98.5 F (36.9 C)] 98.5 F (36.9 C) (11/22 1249) Pulse Rate:  [76-82] 77 (11/22 1249) Resp:  [16] 16 (11/22 1249) BP: (113-123)/(63-76) 113/63 (11/22  1249) SpO2:  [96 %-97 %] 96 % (11/22 1249) Physical Exam  Constitutional:  oriented to person, place, and time. appears well-developed and well-nourished. No distress.  HENT: [place]/AT, PERRLA, no scleral icterus Mouth/Throat: Oropharynx is clear and moist. No oropharyngeal exudate.  Cardiovascular: Normal rate, regular rhythm and normal heart sounds. Exam reveals no gallop and no friction rub.  No murmur heard.  Pulmonary/Chest: Effort normal and breath sounds normal. No respiratory distress.  has no wheezes.  Neck = supple, no nuchal rigidity Abdominal: Soft. Bowel sounds are normal.  exhibits no distension. There is no tenderness.  Lymphadenopathy: no cervical adenopathy. No axillary adenopathy Neurological: alert and oriented to person, place, and time.  Skin: erythema patch to distal lateral tibia superior to lateral malleolus.  Ext: trace swelling to right leg and ankle Psychiatric: a normal mood and affect.  behavior is normal.     LABS: Results for orders placed or performed during the hospital encounter of 09/20/16 (from the past 48 hour(s))  Glucose, capillary     Status: Abnormal   Collection Time: 09/24/16  4:29 PM  Result Value Ref Range   Glucose-Capillary 172 (H) 65 - 99 mg/dL   Comment 1 Notify RN   Glucose, capillary     Status: Abnormal   Collection Time: 09/24/16 10:41 PM  Result Value Ref Range   Glucose-Capillary 181 (H) 65 - 99 mg/dL  CK     Status: Abnormal   Collection Time: 09/25/16  4:11 AM  Result Value Ref Range   Total CK 4,375 (H) 38 - 234 U/L    Comment: RESULTS CONFIRMED BY MANUAL DILUTION  Sedimentation rate     Status: Abnormal   Collection Time: 09/25/16  4:11 AM  Result Value Ref Range   Sed Rate 100 (H) 0 - 22 mm/hr  C-reactive protein     Status: Abnormal   Collection Time: 09/25/16  4:11 AM  Result Value Ref Range   CRP 11.8 (H) <1.0 mg/dL  Glucose, capillary     Status: Abnormal   Collection Time: 09/25/16 11:13 AM  Result Value Ref  Range   Glucose-Capillary 172 (H) 65 - 99 mg/dL   Comment 1 Notify RN   Glucose, capillary     Status: Abnormal   Collection Time: 09/25/16  9:18 PM  Result Value Ref Range   Glucose-Capillary 134 (H) 65 - 99 mg/dL  CK     Status: Abnormal   Collection Time: 09/26/16  5:03 AM  Result Value Ref Range   Total CK 3,648 (H) 38 - 234 U/L  CBC     Status: Abnormal   Collection Time: 09/26/16  5:03 AM  Result Value Ref Range   WBC 9.0 4.0 - 10.5 K/uL   RBC 3.32 (L) 3.87 - 5.11 MIL/uL   Hemoglobin 10.5 (L) 12.0 - 15.0 g/dL   HCT 21.331.7 (L) 08.636.0 - 57.846.0 %   MCV 95.5 78.0 - 100.0 fL   MCH 31.6 26.0 - 34.0 pg   MCHC 33.1 30.0 - 36.0 g/dL   RDW 46.913.8 62.911.5 - 52.815.5 %   Platelets 310  150 - 400 K/uL  Glucose, capillary     Status: Abnormal   Collection Time: 09/26/16  6:50 AM  Result Value Ref Range   Glucose-Capillary 149 (H) 65 - 99 mg/dL  Glucose, capillary     Status: Abnormal   Collection Time: 09/26/16 11:22 AM  Result Value Ref Range   Glucose-Capillary 160 (H) 65 - 99 mg/dL    MICRO: none IMAGING: Mr Tibia Fibula Right Wo Contrast  Result Date: 09/26/2016 CLINICAL DATA:  Right lower leg erythema and tenderness. No acute injury. EXAM: MRI OF LOWER RIGHT EXTREMITY WITHOUT CONTRAST TECHNIQUE: Multiplanar, multisequence MR imaging of the right lower leg was performed. No intravenous contrast was administered. COMPARISON:  CT 09/25/2016 FINDINGS: Examination was performed using the body coil. Both lower legs are included on the axial and coronal images. Bones/Joint/Cartilage No evidence of acute fracture, dislocation, bone destruction or marrow edema in either lower leg. No significant arthropathic changes or effusions demonstrated at the right knee or ankle. Ligaments Not of primary relevance to the exam/ indication. The cruciate ligaments appear intact at the right knee. Muscles and Tendons As demonstrated on recent CT, there is relative enlargement of the muscles of the right peroneal  compartment. These muscles demonstrate heterogeneous T2 hyperintensity as well as mildly increased T1 signal. On coronal images 14 and 15 of series 3, there is an oval, 4.8 cm of focally altered signal intensity which may reflect a hematoma. There is mild peroneal tenosynovitis at the right ankle. There is minimal T2 hyperintensity within the anterior and deep posterior compartment muscles without significant enlargement. The superficial posterior compartment muscles appear unremarkable. Soft tissues Subcutaneous edema is present within both lower legs, greater on the right. There are no focal fluid collections. No vascular abnormalities are seen on noncontrast imaging. IMPRESSION: 1. Findings are similar to those demonstrated on CT from yesterday. The right lower leg peroneal muscles are enlarged with heterogeneous T2 and T1 hyperintensity, most consistent with myositis. Associated peroneal tenosynovitis and possible intramuscular hematoma. Lateral compartment compartment syndrome should be excluded clinically. 2. Lower leg cellulitis, right-greater-than-left. 3. No evidence of soft tissue abscess or osteomyelitis. Electronically Signed   By: Carey Bullocks M.D.   On: 09/26/2016 12:40   Dg Chest Port 1 View  Result Date: 09/24/2016 CLINICAL DATA:  PICC line placement EXAM: PORTABLE CHEST 1 VIEW COMPARISON:  Chest radiograph 08/15/2016 FINDINGS: A left-sided approach PICC line tip overlies the lower SVC. No focal airspace disease or pulmonary edema. No pneumothorax. IMPRESSION: PICC line tip overlying the lower SVC. Electronically Signed   By: Deatra Robinson M.D.   On: 09/24/2016 22:42   Ct Extrem Lower W Cm Bil  Result Date: 09/25/2016 CLINICAL DATA:  Bilateral lower leg swelling. Bilateral lower leg pain. EXAM: CT OF THE LOWER BILATERAL EXTREMITY WITH CONTRAST TECHNIQUE: Multidetector CT imaging of the lower bilateral extremity was performed according to the standard protocol following intravenous  contrast administration. COMPARISON:  None. CONTRAST:  ISOVUE-300 IOPAMIDOL (ISOVUE-300) INJECTION 61% FINDINGS: Bones/Joint/Cartilage No fracture or dislocation. Normal alignment. Bilateral hip joint spaces are maintained. Bilateral knee joint spaces are maintained. Bilateral ankle joint spaces are maintained. Bilateral subtalar joints are normal. Mild osteoarthritis of bilateral sacroiliac joints. Small left knee joint effusion. No right knee joint effusion. Ligaments Ligaments are suboptimally evaluated by CT. Medial and lateral collateral ligaments bilaterally and grossly intact. ACL and PCL are grossly intact bilaterally. Muscles and Tendons Mild expansion of the right anterior and peroneal compartment muscles of the right lower  leg most concerning for mild myositis. Muscles are otherwise normal bilaterally. Soft tissue No fluid collection or hematoma. No soft tissue mass. Mild soft tissue edema in the subcutaneous fat throughout the right lower extremity most severe in the upper right lower leg. Mild soft tissue edema in the subcutaneous fat of the left lower extremity. IMPRESSION: 1. Mild cellulitis of bilateral lower extremities. Mild expansion of the right anterior and peroneal compartment muscles of the right lower leg most concerning for mild myositis secondary to an infectious or inflammatory etiology. Electronically Signed   By: Elige Ko   On: 09/25/2016 14:46    Assessment/Plan:  50yo F admitted with rhabdomyolysis with right leg swelling.  She has evidence of cellulitis on day 6 of clindamycin  Peroneal compartment syndrome = just spoke with dr Ophelia Charter who felt she has compartment syndrome, but delayed in diagnosis where fasciotomy would not be useful, for now recommended that she keeps leg elevated and uses walker. May need AFO to help with peroneal nerve injury, leading to foot drop  - cellulitis possibly related to abrasion from shaver  - can finish out remain 10day course with  cephalexin for streptococcal coverage  rhabdo = steadily improving, continue to recommend that she takes good oral intake.  transaminitis = likely related to rhabdo, consider repeat LFT to ensure it is trending down.

## 2016-09-27 DIAGNOSIS — L03115 Cellulitis of right lower limb: Principal | ICD-10-CM

## 2016-09-27 LAB — HEPATIC FUNCTION PANEL
ALT: 58 U/L — ABNORMAL HIGH (ref 14–54)
AST: 72 U/L — AB (ref 15–41)
Albumin: 3 g/dL — ABNORMAL LOW (ref 3.5–5.0)
Alkaline Phosphatase: 82 U/L (ref 38–126)
BILIRUBIN DIRECT: 0.1 mg/dL (ref 0.1–0.5)
BILIRUBIN TOTAL: 0.7 mg/dL (ref 0.3–1.2)
Indirect Bilirubin: 0.6 mg/dL (ref 0.3–0.9)
Total Protein: 6.2 g/dL — ABNORMAL LOW (ref 6.5–8.1)

## 2016-09-27 LAB — CBC
HCT: 33.1 % — ABNORMAL LOW (ref 36.0–46.0)
Hemoglobin: 10.9 g/dL — ABNORMAL LOW (ref 12.0–15.0)
MCH: 31.2 pg (ref 26.0–34.0)
MCHC: 32.9 g/dL (ref 30.0–36.0)
MCV: 94.8 fL (ref 78.0–100.0)
PLATELETS: 332 10*3/uL (ref 150–400)
RBC: 3.49 MIL/uL — AB (ref 3.87–5.11)
RDW: 14 % (ref 11.5–15.5)
WBC: 8.5 10*3/uL (ref 4.0–10.5)

## 2016-09-27 LAB — GLUCOSE, CAPILLARY
GLUCOSE-CAPILLARY: 220 mg/dL — AB (ref 65–99)
GLUCOSE-CAPILLARY: 140 mg/dL — AB (ref 65–99)
GLUCOSE-CAPILLARY: 134 mg/dL — AB (ref 65–99)
GLUCOSE-CAPILLARY: 149 mg/dL — AB (ref 65–99)

## 2016-09-27 LAB — CK: CK TOTAL: 2618 U/L — AB (ref 38–234)

## 2016-09-27 MED ORDER — MELOXICAM 15 MG PO TABS
15.0000 mg | ORAL_TABLET | Freq: Every day | ORAL | Status: DC | PRN
Start: 2016-09-27 — End: 2017-01-22

## 2016-09-27 MED ORDER — OXYCODONE HCL 5 MG PO TABS: 5.0000 mg | ORAL_TABLET | Freq: Four times a day (QID) | ORAL | 0 refills | Status: DC | PRN

## 2016-09-27 MED ORDER — VALSARTAN 160 MG PO TABS: 160.0000 mg | ORAL_TABLET | Freq: Every day | ORAL | 0 refills | Status: DC

## 2016-09-27 MED ORDER — LEVOTHYROXINE SODIUM 200 MCG PO TABS: 200.0000 ug | ORAL_TABLET | Freq: Every day | ORAL | 0 refills | Status: DC

## 2016-09-27 MED ORDER — CEPHALEXIN 500 MG PO CAPS: 500.0000 mg | ORAL_CAPSULE | Freq: Four times a day (QID) | ORAL | 0 refills | Status: DC

## 2016-09-27 NOTE — Progress Notes (Signed)
This RN provided pt her paper prescriptions and discharge instructions. All questions answered and pt is ready to discharge.

## 2016-09-27 NOTE — Progress Notes (Addendum)
Per IV team, pt started oozing blood when PICC line removed. IV team put pressure on site for 20 mins. A new dressing placed on and pt was instructed to stay in bed for to 1 hour. This RN reassessed pt. Pt was able to get out from bed and went to bathroom without any complication on dressing site. Addition gauzed, Kerlix and ace warp provided to pt in case of  a bleeding occurs later.

## 2016-09-27 NOTE — Discharge Instructions (Signed)
Rhabdomyolysis °Rhabdomyolysis is a condition that happens when muscle cells break down and release substances into the blood that can damage the kidneys. This condition happens because of damage to the muscles that move bones (skeletal muscle). When the skeletal muscles are damaged, substances inside the muscle cells go into the blood. One of these substances is a protein called myoglobin. °Large amounts of myoglobin can cause kidney damage or kidney failure. Other substances that are released by muscle cells may upset the balance of the minerals (electrolytes) in your blood. This imbalance causes your blood to have too much acid (acidosis). °What are the causes? °This condition is caused by muscle damage. Muscle damage often happens because of: °· Using your muscles too much. °· An injury that crushes or squeezes a muscle too tightly. °· Using illegal drugs, mainly cocaine. °· Alcohol abuse. °Other possible causes include: °· Prescription medicines, such as those that: °¨ Lower cholesterol (statins). °¨ Treat ADHD (attention deficit hyperactivity disorder) or help with weight loss (amphetamines). °¨ Treat pain (opiates). °· Infections. °· Muscle diseases that are passed down from parent to child (inherited). °· High fever. °· Heatstroke. °· Not having enough fluids in your body (dehydration). °· Seizures. °· Surgery. °What increases the risk? °This condition is more likely to develop in people who: °· Have a family history of muscle disease. °· Take part in extreme sports, such as running in marathons. °· Have diabetes. °· Are older. °· Abuse drugs or alcohol. °What are the signs or symptoms? °Symptoms of this condition vary. Some people have very few symptoms, and other people have many symptoms. The most common symptoms include: °· Muscle pain and swelling. °· Weak muscles. °· Dark urine. °· Feeling weak and tired. °Other symptoms include: °· Nausea and vomiting. °· Fever. °· Pain in the abdomen. °· Pain in the  joints. °Symptoms of complications from this condition include: °· Heart rhythm that is not normal (arrhythmia). °· Seizures. °· Not urinating enough because of kidney failure. °· Very low blood pressure (shock). Signs of shock include dizziness, blurry vision, and clammy skin. °· Bleeding that is hard to stop or control. °How is this diagnosed? °This condition is diagnosed based on your medical history, your symptoms, and a physical exam. Tests may also be done, including: °· Blood tests. °· Urine tests to check for myoglobin. °You may also have other tests to check for causes of muscle damage and to check for complications. °How is this treated? °Treatment for this condition helps to: °· Make sure you have enough fluids in your body. °· Lower the acid levels in your blood to reverse acidosis. °· Protect your kidneys. °Treatment may include: °· Fluids and medicines given through an IV tube that is inserted into one of your veins. °· Medicines to lower acidosis or to bring back the balance of the minerals in your body. °· Hemodialysis. This treatment uses an artificial kidney machine to filter your blood while you recover. You may have this if other treatments are not helping. °Follow these instructions at home: °· Take over-the-counter and prescription medicines only as told by your health care provider. °· Rest at home until your health care provider says that you can return to your normal activities. °· Drink enough fluid to keep your urine clear or pale yellow. °· Do not do activities that take a lot of effort (are strenuous). Ask your health care provider what level of exercise is safe for you. °· Do not abuse drugs or alcohol.   If you are having problems with drug or alcohol use, ask your health care provider for help.  Keep all follow-up visits as told by your health care provider. This is important. Contact a health care provider if:  You start having symptoms of this condition after treatment. Get help  right away if:  You have a seizure.  You bleed easily or cannot control bleeding.  You cannot urinate.  You have chest pain.  You have trouble breathing. This information is not intended to replace advice given to you by your health care provider. Make sure you discuss any questions you have with your health care provider. Document Released: 10/04/2004 Document Revised: 08/03/2016 Document Reviewed: 08/03/2016 [employer] Interactive Patient Education  2017 [employer]  Pain medications  How can pain medicine affect me?  You were given a prescription for pain medicine. This medicine may make you tired or drowsy and may affect your ability to think clearly. Pain medicine may also affect your ability to drive or perform certain physical activities. It may not be possible to make all of your pain go away, but you should be comfortable enough to move, breathe, and take care of yourself. How often should I take pain medicine and how much should I take?  Take pain medicine only as directed by your health care provider and only as needed for pain.  You do not need to take pain medicine if you are not having pain, unless directed by your health care provider.  You can take less than the prescribed dose if you find that a smaller amount of medicine controls your pain. What restrictions do I have while taking pain medicine? Follow these instructions after you start taking pain medicine, while you are taking the medicine, and for 8 hours after you stop taking the medicine:  Do not drive.  Do not operate machinery.  Do not operate power tools.  Do not sign legal documents.  Do not drink alcohol.  Do not take sleeping pills.  Do not supervise children by yourself.  Do not participate in activities that require climbing or being in high places.  Do not enter a body of water--such as a lake, river, ocean, spa, or swimming pool--without an adult nearby who can monitor and help you. How  can I keep others safe while I am taking pain medicine?  Store your pain medicine as directed by your health care provider. Make sure that it is placed where children and pets cannot reach it.  Never share your pain medicine with anyone.  Do not save any leftover pills. If you have any leftover pain medicine, get rid of it or destroy it as directed by your health care provider. What else do I need to know about taking pain medicine?  Use a stool softener if you become constipated from your pain medicine. Increasing your intake of fruits and vegetables will also help with constipation.  Write down the times when you take your pain medicine. Look at the times before you take your next dose of medicine. It is easy to become confused while on pain medicine. Recording the times helps you to avoid an overdose.  If your pain is severe, do not try to treat it yourself by taking more pills than instructed on your prescription. Contact your health care provider for help.  You may have been prescribed a pain medicine that contains acetaminophen. Do not take any other acetaminophen while taking this medicine. An overdose of acetaminophen can result in  severe liver damage. Acetaminophen is found in many over-the-counter (OTC) and prescription medicines. If you are taking any medicines in addition to your pain medicine, check the active ingredients on those medicines to see if acetaminophen is listed. When should I call my health care provider?  Your medicine is not helping to make the pain go away.  You vomit or have diarrhea shortly after taking the medicine.  You develop new pain in areas that did not hurt before.  You have an allergic reaction to your medicine. This may include: ? Itchiness. ? Swelling. ? Dizziness. ? Developing a new rash. When should I call 911 or go to the emergency room?  You feel dizzy or you faint.  You are very confused or disoriented.  You repeatedly vomit.  Your  skin or lips turn pale or bluish in color.  You have shortness of breath or you are breathing much more slowly than usual.  You have a severe allergic reaction to your medicine. This includes: ? Developing tongue swelling. ? Having difficulty breathing. This information is not intended to replace advice given to you by your health care provider. Make sure you discuss any questions you have with your health care provider. Document Released: 01/28/2001 Document Revised: 05/11/2016 Document Reviewed: 08/26/2014 [employer] Interactive Patient Education  2017 [employer]

## 2016-09-27 NOTE — Discharge Summary (Signed)
Physician Discharge Summary  Jessica Knox QMG:[id number] DOB: 06-21-1966  PCP: Harrison Mons, PA-C  Admit date: 09/20/2016 Discharge date: 09/27/2016   Recommendations for Outpatient Follow-up:  1. Daphane Shepherd, PA-C/PCP in 5 days with repeat labs (CBC, CMP, CK, ESR & CRP). Please consider outpatient rheumatology consultation and follow-up for evaluation of her rhabdomyolysis. Please follow RMSF serology that was sent from the hospital on 09/26/16. 2. Follow-up full TFTs in 4-6 weeks. 3. Dr. Rodell Perna, Orthopedics in 1 week.  Home Health: PT Equipment/Devices: Rolling walker with 5 inch wheels.    Discharge Condition: Improved and stable.  CODE STATUS: Full  Diet recommendation: Heart healthy and diabetic diet.  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 Patient also had been having severe pain all extremities. Patient has been recently placed on Lipitor last month pta which patient discontinued after the onset of above symptoms 2 weeks prior to admission. Patient has stopped taking metformin. Denies recent travel out of state or insect bites. She was noted to have significant rhabdomyolysis.  Assessment & Plan:   # Peripheral edema, erythematous rash, elevated LFTs (transaminitis) and? Right leg compartment syndrome with cellulitis and partial foot drop: Exact etiology unknown. -Patient had elevated CK level of 9149 on admission, elevated TSH level of  52. -transaminitis likely due to rahbdomyolysis, enzymes trending down and have almost normalized. CK 9600 on admission > 2618 on 11/23 -Patient had elevated lactic acid on admission. Also has elevated CRP (11.8) and sedimentation level (100).  -HIV negative, acute hepatitis panel unremarkable, anti-DNA antibody negative, rheumatoid factor negative. ANA level negative -Had been on empiric antibiotic/IV clindamycin since 11/17 for empiric cellulitis. ID consulted on 11/22 and recommend changing clindamycin to cephalexin to complete total 10 day course for streptococcal coverage. As per discussion with ID, low index of suspicion for RMSF -Doppler ultrasound of upper and lower extremities negative for DVTs. - treated supportive care, leg elevation and pain management.  - CT bilateral lower extremities 09/25/16 suggested mild cellulitis (clinically not the case) and mild myositis of right leg >left leg. Orthopedic consultation appreciated. They requested MRI of right lower extremity which showed findings consistent with right lower leg peroneal muscle myositis, peroneal tenosynovitis and possible intramuscular hematoma. Report suggests excluding lateral compartment syndrome. No evidence of soft tissue abscess or osteomyelitis. - Orthopedics consultation appreciated. Discussed with Dr. Lorin Mercy on 11/22. He indicated that she has some muscle damage in the peroneal compartment which may be from compartment syndrome a few days ago, did not think that at this late point compartment release would likely to be helpful, needs to be off of her leg most of the time, stopped Lovenox, use foot drop brace at night (has been provided), may require AFO (? Partial right foot drop) and outpatient follow-up with Dr. Lorin Mercy in 1 week. - Her upper extremity edema seems to have resolved. Trace left leg edema. Diffuse right leg swelling is better than yesterday, not stiff, good peripheral pulsation and able to move her right ankle  better.  Minimal area of patchy faint erythema right lower lateral leg which is much better compared to initial pictures that her fianc had taken.  # Abdomen distention: CT abdomen pelvis showed mild hepatomegaly with hepatic steatosis. No acute inflammatory changes. Denies any GI symptoms at this time. LFT elevation was probably related to rhabdomyolysis and has improved. Abdominal distention improved or even resolved.  # Nontraumatic rhabdomyolysis: Exact etiology unknown. May be related with recent use of statin and in the setting of hypothyroidism. Patient has normal kidney function. CK level trending down from 9600> 3600 >2618  .  -off IVF. Pt has good oral intake. Mildly elevated LFTs are likely secondary to this and are improving. - Exact etiology of her rhabdomyolysis is unclear. She stopped newly started atorvastatin a couple of weeks prior to admission (cannot definitely tell if she stopped it 2-4 weeks prior to admission). It is interesting that patient continued to have worsening symptoms despite stopping atorvastatin raising the question of alternate etiology for her rhabdomyolysis and myositis.  - Recommend outpatient rheumatology consultation for evaluation of other possible etiologies for this. This was discussed in detail with patient and her fianc at bedside. They verbalized understanding.   # Hypokalemia: Replaced  # Hypothyroidism: TSH level elevated to 52 with low free T4. Increased synthroid dose from 175 to 200 mcg. Patient reported that she was taking Synthroid at home regularly and never missed a dose. Follow-up TSH in 4-6 weeks.  # Essential hypertension: Blood pressure acceptable. Changed Diovan HCT to Diovan, discontinued Aldactone due to current rhabdomyolysis and need for adequate hydration, continue prior home dose of Toprol-XL and diltiazem.    #Type 2 diabetes in obese patient: Treated inpatient with sliding scale. Reasonable inpatient control. A1c 8.6 suggesting  poor outpatient control.? Compliance issues. Outpatient follow-up with PCP.   #History of supraventricular tachycardia: On Cardizem and metoprolol.  Anemia: Unclear etiology. Although hematoma reported in right leg on MRI, does not seem significant drop in hemoglobin by approximately 2 g. Stable. Outpatient follow-up     Discussed in detail with patient's fianc at bedside. Updated care and answered questions.   Consultants:   Orthopedics  Infectious disease  Procedures:   PICC-removed on day of discharge.   Discharge Instructions  Discharge Instructions    Call MD for:  difficulty breathing, headache or visual disturbances    Complete by:  As directed    Call MD for:  extreme fatigue    Complete by:  As directed    Call MD for:  persistant dizziness or light-headedness    Complete by:  As directed    Call MD for:  redness, tenderness, or signs of infection (pain, swelling, redness, odor or green/yellow discharge around incision site)    Complete by:  As directed    Call MD for:  severe uncontrolled pain    Complete by:  As directed    Diet - low sodium heart healthy    Complete by:  As directed    Diet Carb Modified    Complete by:  As directed    Increase activity slowly    Complete by:  As directed        Medication List    STOP taking these medications   atorvastatin 20 MG tablet Commonly known as:  LIPITOR   benzonatate 100 MG capsule Commonly known as:  TESSALON   metFORMIN 500 MG tablet Commonly known as:  GLUCOPHAGE   predniSONE 20 MG tablet Commonly known as:  DELTASONE  spironolactone 50 MG tablet Commonly known as:  ALDACTONE   valsartan-hydrochlorothiazide 160-25 MG tablet Commonly known as:  DIOVAN-HCT     TAKE these medications   ADVANCED FIBER COMPLEX PO Take 1 each by mouth 2 (two) times daily.   albuterol 108 (90 Base) MCG/ACT inhaler Commonly known as:  PROVENTIL HFA;VENTOLIN HFA Inhale 2 puffs into the lungs every 4  (four) hours as needed for wheezing or shortness of breath (cough, shortness of breath or wheezing.).   ALPRAZolam 0.5 MG tablet Commonly known as:  XANAX Take 1 tablet (0.5 mg total) by mouth 2 (two) times daily as needed for anxiety.   aspirin EC 81 MG tablet Take 81 mg by mouth daily.   Blood Glucose Monitoring Suppl Kit Check blood sugar daily as needed   buPROPion 150 MG 12 hr tablet Commonly known as:  WELLBUTRIN SR Take 1 tablet (150 mg total) by mouth 2 (two) times daily.   CARTIA XT 180 MG 24 hr capsule Generic drug:  diltiazem TAKE 1 CAPSULE DAILY What changed:  See the new instructions.   cephALEXin 500 MG capsule Commonly known as:  KEFLEX Take 1 capsule (500 mg total) by mouth 4 (four) times daily.   cetirizine 10 MG tablet Commonly known as:  ZYRTEC Take 10 mg by mouth daily.   esomeprazole 40 MG capsule Commonly known as:  NEXIUM TAKE 1 CAPSULE DAILY What changed:  See the new instructions.   estradiol 1 MG tablet Commonly known as:  ESTRACE TAKE 1 TABLET DAILY What changed:  See the new instructions.   fluticasone 50 MCG/ACT nasal spray Commonly known as:  FLONASE Place 1 spray into both nostrils daily as needed for allergies or rhinitis.   glipiZIDE 10 MG tablet Commonly known as:  GLUCOTROL Take 1 tablet (10 mg total) by mouth 2 (two) times daily before a meal.   glucose blood test strip Use as instructed   HAIR/SKIN/NAILS PO Take 1 tablet by mouth daily.   Ipratropium-Albuterol 20-100 MCG/ACT Aers respimat Commonly known as:  COMBIVENT Inhale 1 puff into the lungs every 6 (six) hours.   levothyroxine 200 MCG tablet Commonly known as:  SYNTHROID, LEVOTHROID Take 1 tablet (200 mcg total) by mouth daily before breakfast. What changed:  medication strength  how much to take   meloxicam 15 MG tablet Commonly known as:  MOBIC Take 1 tablet (15 mg total) by mouth daily as needed for pain.   metoprolol succinate 50 MG 24 hr  tablet Commonly known as:  TOPROL-XL Take 50 mg by mouth 2 (two) times daily. Take with or immediately following a meal.   nitrofurantoin 50 MG capsule Commonly known as:  MACRODANTIN TAKE 1 CAPSULE AT BEDTIME What changed:  See the new instructions.   ondansetron 8 MG disintegrating tablet Commonly known as:  ZOFRAN-ODT Take 1 tablet (8 mg total) by mouth every 8 (eight) hours as needed for nausea.   onetouch ultrasoft lancets Test blood sugar daily.   oxyCODONE 5 MG immediate release tablet Commonly known as:  Oxy IR/ROXICODONE Take 1 tablet (5 mg total) by mouth every 6 (six) hours as needed for severe pain.   valsartan 160 MG tablet Commonly known as:  DIOVAN Take 1 tablet (160 mg total) by mouth daily. Start taking on:  09/28/2016      Follow-up Information    [employer], PA-C. Schedule an appointment as soon as possible for a visit in 5 day(s).   Specialty:  Family Medicine Why:  To  be seen with repeat labs (CBC, CMP, CK, ESR & CRP). Contact information: [place] [address] [phone]        [facility] Follow up.   Why:  Someone from [facility] will contact you to arrange start date and time for therapy. Contact information: [place] [address] [phone]        Marybelle Killings, MD Follow up in 1 week(s).   Specialty:  Orthopedic Surgery Contact information: [address] [place] [address] [phone]          Allergies  Allergen Reactions  . Atorvastatin Swelling and Other (See Comments)    Edema  . Ciprofloxacin Palpitations and Other (See Comments)    Can take in spaced, small doses  . Morphine And Related Itching      Procedures/Studies: Ct Abdomen Pelvis W Contrast  Result Date: 09/21/2016 CLINICAL DATA:  50 year old with abdominal pain. History of right oophorectomy, cholecystectomy and hysterectomy. EXAM: CT ABDOMEN AND PELVIS WITH CONTRAST  TECHNIQUE: Multidetector CT imaging of the abdomen and pelvis was performed using the standard protocol following bolus administration of intravenous contrast. CONTRAST:  155m ISOVUE-300 IOPAMIDOL (ISOVUE-300) INJECTION 61% COMPARISON:  01/26/2015 FINDINGS: Lower chest: Bandlike density at the left lung base is most suggestive for subsegmental atelectasis. No significant pleural effusions. Hepatobiliary: Diffuse low-density in the liver parenchyma is compatible with hepatic steatosis. Focal fat along the medial right hepatic lobe on sequence 2, image 35. Portal venous system and hepatic veins are patent. The gallbladder has been removed without biliary dilatation. There also appears to be new focal fat along the anterior aspect of the caudate lobe that measures 3.2 x 1.6 cm. There is a vessel extending directly through this focal low-density area in the caudate which is suggestive for focal fat. Liver is also large for size measuring 20 cm in the craniocaudal dimension. Pancreas: Normal appearance of the pancreas without inflammation or duct dilatation. Spleen: Normal appearance of spleen without enlargement. Adrenals/Urinary Tract: Normal adrenal glands. Urinary bladder is decompressed unremarkable. Normal appearance of both kidneys. Stomach/Bowel: Stomach is within normal limits. Appendix appears normal. No evidence of bowel wall thickening, distention, or inflammatory changes. Vascular/Lymphatic: Minimal atherosclerotic disease in the distal abdominal aorta. There is no evidence for aortic aneurysm. No significant lymph node enlargement in the abdomen or pelvis. Reproductive: Status post hysterectomy. No adnexal masses. Again noted are small low-density structures in the vagina region. The largest is on the right side measuring 1.3 x 0.4 cm. This structure has decreased in size and previously measured 2.0 x 1.0 cm. These are probably incidental findings especially since they are getting smaller but could  represent Gartner duct cysts based on the location. Other: No free fluid.  No free air. Musculoskeletal: Disc space narrowing at L4-L5 and L5-S1. There is a central disc protrusion at L5-S1. IMPRESSION: Mild hepatomegaly with hepatic steatosis. Areas of focal fat in the liver, most prominent in the caudate lobe. No acute inflammatory changes in the abdomen or pelvis. Disc space disease in lower lumbar spine. Electronically Signed   By: AMarkus DaftM.D.   On: 09/21/2016 09:58   Mr Tibia Fibula Right Wo Contrast  Result Date: 09/26/2016 CLINICAL DATA:  Right lower leg erythema and tenderness. No acute injury. EXAM: MRI OF LOWER RIGHT EXTREMITY WITHOUT CONTRAST TECHNIQUE: Multiplanar, multisequence MR imaging of the right lower leg was performed. No intravenous contrast was administered. COMPARISON:  CT 09/25/2016 FINDINGS: Examination was performed  using the body coil. Both lower legs are included on the axial and coronal images. Bones/Joint/Cartilage No evidence of acute fracture, dislocation, bone destruction or marrow edema in either lower leg. No significant arthropathic changes or effusions demonstrated at the right knee or ankle. Ligaments Not of primary relevance to the exam/ indication. The cruciate ligaments appear intact at the right knee. Muscles and Tendons As demonstrated on recent CT, there is relative enlargement of the muscles of the right peroneal compartment. These muscles demonstrate heterogeneous T2 hyperintensity as well as mildly increased T1 signal. On coronal images 14 and 15 of series 3, there is an oval, 4.8 cm of focally altered signal intensity which may reflect a hematoma. There is mild peroneal tenosynovitis at the right ankle. There is minimal T2 hyperintensity within the anterior and deep posterior compartment muscles without significant enlargement. The superficial posterior compartment muscles appear unremarkable. Soft tissues Subcutaneous edema is present within both lower legs,  greater on the right. There are no focal fluid collections. No vascular abnormalities are seen on noncontrast imaging. IMPRESSION: 1. Findings are similar to those demonstrated on CT from yesterday. The right lower leg peroneal muscles are enlarged with heterogeneous T2 and T1 hyperintensity, most consistent with myositis. Associated peroneal tenosynovitis and possible intramuscular hematoma. Lateral compartment compartment syndrome should be excluded clinically. 2. Lower leg cellulitis, right-greater-than-left. 3. No evidence of soft tissue abscess or osteomyelitis. Electronically Signed   By: Richardean Sale M.D.   On: 09/26/2016 12:40   Dg Chest Port 1 View  Result Date: 09/24/2016 CLINICAL DATA:  PICC line placement EXAM: PORTABLE CHEST 1 VIEW COMPARISON:  Chest radiograph 08/15/2016 FINDINGS: A left-sided approach PICC line tip overlies the lower SVC. No focal airspace disease or pulmonary edema. No pneumothorax. IMPRESSION: PICC line tip overlying the lower SVC. Electronically Signed   By: Ulyses Jarred M.D.   On: 09/24/2016 22:42   Ct Extrem Lower W Cm Bil  Result Date: 09/25/2016 CLINICAL DATA:  Bilateral lower leg swelling. Bilateral lower leg pain. EXAM: CT OF THE LOWER BILATERAL EXTREMITY WITH CONTRAST TECHNIQUE: Multidetector CT imaging of the lower bilateral extremity was performed according to the standard protocol following intravenous contrast administration. COMPARISON:  None. CONTRAST:  141m ISOVUE-300 IOPAMIDOL (ISOVUE-300) INJECTION 61% FINDINGS: Bones/Joint/Cartilage No fracture or dislocation. Normal alignment. Bilateral hip joint spaces are maintained. Bilateral knee joint spaces are maintained. Bilateral ankle joint spaces are maintained. Bilateral subtalar joints are normal. Mild osteoarthritis of bilateral sacroiliac joints. Small left knee joint effusion. No right knee joint effusion. Ligaments Ligaments are suboptimally evaluated by CT. Medial and lateral collateral ligaments  bilaterally and grossly intact. ACL and PCL are grossly intact bilaterally. Muscles and Tendons Mild expansion of the right anterior and peroneal compartment muscles of the right lower leg most concerning for mild myositis. Muscles are otherwise normal bilaterally. Soft tissue No fluid collection or hematoma. No soft tissue mass. Mild soft tissue edema in the subcutaneous fat throughout the right lower extremity most severe in the upper right lower leg. Mild soft tissue edema in the subcutaneous fat of the left lower extremity. IMPRESSION: 1. Mild cellulitis of bilateral lower extremities. Mild expansion of the right anterior and peroneal compartment muscles of the right lower leg most concerning for mild myositis secondary to an infectious or inflammatory etiology. Electronically Signed   By: HKathreen Devoid  On: 09/25/2016 14:46      Subjective: Anxious to go home. Feels that her right leg swelling and redness have significantly improved compared  to yesterday. Not much pain. Denies any other complaints.   Discharge Exam:  Vitals:   09/26/16 0959 09/26/16 1249 09/26/16 2100 09/27/16 0600  BP: 123/73 113/63 124/74 118/63  Pulse: 79 77 81 70  Resp:  '16 16 16  ' Temp:  98.5 F (36.9 C) 98.6 F (37 C) 98.5 F (36.9 C)  TempSrc:  Oral Oral Oral  SpO2: 96% 96% 97% 96%  Weight:      Height:        General exam: Pleasant young female lying comfortably supine in bed.  Respiratory system: Clear bilateral, no wheezing or crackle. No increased work of breathing.  Cardiovascular system: Regular rate rhythm, S1-S2 normal. No murmur appreciated Gastrointestinal system: Abdomen soft, nontender, nondistended. Bowel sound positive Central nervous system: Alert, awake, nonfocal neurological exam. Extremities: Symmetric 5 x 5 power in all extremities. Fairly focal area of faint erythematous but nontender and non-warm area over right lower anterior lateral leg-Much better compared to pictures taken by fianc  on his phone on initial admission. Right leg continues to be diffusely swollen but better compared to yesterday. Peripheral pulses felt. Able to dorsal and plantar flex better compared to yesterday.   Skin: erythematous macular rash mostly on right lower extremity-improved . Psychiatry: Judgement and insight appear normal. Mood & affect appropriate.     The results of significant diagnostics from this hospitalization (including imaging, microbiology, ancillary and laboratory) are listed below for reference.     Microbiology: Recent Results (from the past 240 hour(s))  MRSA PCR Screening     Status: None   Collection Time: 09/21/16  1:22 AM  Result Value Ref Range Status   MRSA by PCR NEGATIVE NEGATIVE Final    Comment:        The GeneXpert MRSA Assay (FDA approved for NASAL specimens only), is one component of a comprehensive MRSA colonization surveillance program. It is not intended to diagnose MRSA infection nor to guide or monitor treatment for MRSA infections.      Labs: BNP (last 3 results)  Recent Labs  03/15/16 1619 09/22/16 0840  BNP 39.7 62.8   Basic Metabolic Panel:  Recent Labs Lab 09/20/16 1411 09/21/16 0524 09/22/16 0435 09/23/16 0538 09/24/16 0257  NA 130* 131* 132* 135 135  K 3.7 3.6 4.3 3.4* 4.0  CL 90* 91* 92* 98* 101  CO2 '27 24 29 29 25  ' GLUCOSE 259* 190* 205* 177* 130*  BUN 9 6 5* 6 6  CREATININE 0.68 0.68 0.76 0.68 0.74  CALCIUM 9.3 8.5* 8.6* 8.1* 7.8*  MG 1.6*  --   --   --   --    Liver Function Tests:  Recent Labs Lab 09/21/16 0524 09/22/16 0435 09/23/16 0538 09/24/16 0257 09/27/16 0519  AST 203* 256* 170* 143* 72*  ALT 127* 144* 110* 90* 58*  ALKPHOS 109 142* 130* 109 82  BILITOT 0.8 1.0 0.5 0.2* 0.7  PROT 6.9 7.7 6.9 6.3* 6.2*  ALBUMIN 4.0 3.9 3.4* 3.4* 3.0*   No results for input(s): LIPASE, AMYLASE in the last 168 hours. No results for input(s): AMMONIA in the last 168 hours. CBC:  Recent Labs Lab 09/20/16 1411  09/21/16 0524 09/22/16 0435 09/26/16 0503 09/27/16 0519  WBC 9.9 9.7 10.8* 9.0 8.5  NEUTROABS 7.7 7.2 7.9*  --   --   HGB 12.0 12.3 12.9 10.5* 10.9*  HCT 35.9* 36.8 38.9 31.7* 33.1*  MCV 93.2 93.4 94.4 95.5 94.8  PLT 291 279 299 310 332   Cardiac  Enzymes:  Recent Labs Lab 09/23/16 0538 09/24/16 0805 09/25/16 0411 09/26/16 0503 09/27/16 0519  CKTOTAL 7,113* 5,653* 4,375* 3,648* 2,618*   BNP: Invalid input(s): POCBNP CBG:  Recent Labs Lab 09/26/16 1122 09/26/16 1655 09/26/16 2058 09/27/16 0623 09/27/16 1133  GLUCAP 160* 133* 130* 134* 149*   Urinalysis    Component Value Date/Time   COLORURINE YELLOW 09/20/2016 1728   APPEARANCEUR CLEAR 09/20/2016 1728   LABSPEC 1.007 09/20/2016 1728   PHURINE 6.5 09/20/2016 1728   GLUCOSEU 100 (A) 09/20/2016 1728   HGBUR NEGATIVE 09/20/2016 1728   BILIRUBINUR NEGATIVE 09/20/2016 1728   BILIRUBINUR negative 07/30/2016 1734   BILIRUBINUR Moderate 06/20/2015 1257   KETONESUR NEGATIVE 09/20/2016 1728   PROTEINUR NEGATIVE 09/20/2016 1728   UROBILINOGEN 0.2 07/30/2016 1734   NITRITE NEGATIVE 09/20/2016 1728   LEUKOCYTESUR TRACE (A) 09/20/2016 1728      Time coordinating discharge: Over 30 minutes  SIGNED:  Vernell Leep, MD, FACP, FHM. [facility] Pager [phone]  If 7PM-7AM, please contact night-coverage [email or web address] Password [email or web address] 09/27/2016, 1:37 PM

## 2016-09-28 LAB — ROCKY MTN SPOTTED FVR ABS PNL(IGG+IGM)
RMSF IGG: NEGATIVE
RMSF IGM: 0.42 {index} (ref 0.00–0.89)

## 2016-10-01 NOTE — Telephone Encounter (Signed)
Patient wants to know what all she needs to do before her next appointment this Friday 10/05/16 at 3 pm. She just got out of the hospital she says Dr.Yates wanted her to get lab work and physical therapy her husband just wants to make sure they have all their ducks in a row because  He has to take off work every time she has an appointment.  Cb#: [phone]

## 2016-10-02 ENCOUNTER — Other Ambulatory Visit: Payer: Self-pay | Admitting: Physician Assistant

## 2016-10-02 ENCOUNTER — Ambulatory Visit (INDEPENDENT_AMBULATORY_CARE_PROVIDER_SITE_OTHER): Payer: BLUE CROSS/BLUE SHIELD | Admitting: Physician Assistant

## 2016-10-02 ENCOUNTER — Encounter: Payer: Self-pay | Admitting: Physician Assistant

## 2016-10-02 VITALS — BP 138/88 | HR 72 | Temp 98.0°F | Resp 16 | Ht 59.0 in

## 2016-10-02 DIAGNOSIS — E039 Hypothyroidism, unspecified: Secondary | ICD-10-CM

## 2016-10-02 DIAGNOSIS — R740 Nonspecific elevation of levels of transaminase and lactic acid dehydrogenase [LDH]: Secondary | ICD-10-CM

## 2016-10-02 DIAGNOSIS — R7401 Elevation of levels of liver transaminase levels: Secondary | ICD-10-CM

## 2016-10-02 DIAGNOSIS — M6282 Rhabdomyolysis: Secondary | ICD-10-CM | POA: Diagnosis not present

## 2016-10-02 DIAGNOSIS — L03115 Cellulitis of right lower limb: Secondary | ICD-10-CM

## 2016-10-02 NOTE — Patient Instructions (Addendum)
Continue with healthy eating choices (go VERY easy on the sweets and carbohydrates, focus on lean proteins and VEGETABLES!!) and staying hydrated. See Dr. Ophelia CharterYates as planned on 10/05/2016. As long as you continue to improve, I'll see you in 4 weeks for re-evaluation of the thyroid studies. Depending on how today's labs are improving, we may need to follow-up sooner.      IF you received an x-ray today, you will receive an invoice from [facility]. Please contact [facility] at [phone] with questions or concerns regarding your invoice.   IF you received labwork today, you will receive an invoice from [facility]. Please contact [facility] at [phone] with questions or concerns regarding your invoice.   Our billing staff will not be able to assist you with questions regarding bills from these companies.  You will be contacted with the lab results as soon as they are available. The fastest way to get your results is to activate your My Chart account. Instructions are located on the last page of this paperwork. If you have not heard from us regarding the results in 2 weeks, please contact this office.     [employer] has three collection boxes for unwanted prescription or over-the-counter pills or capsules.  ~ Available 24/7 -- [address] in [place] ~ Available 8 am to 5 pm Mondays through Fridays -- [address] -- [address]

## 2016-10-02 NOTE — Progress Notes (Signed)
Patient ID: Jessica Knox, female    DOB: Apr 22, 1966, 50 y.o.   MRN: [id number]  PCP: Jessica Mons, PA-C  Chief Complaint  Patient presents with  . Follow-up    hospital - rt. lower leg   . labwork    Subjective:   Presents for hospital follow-up.  Admit date: 09/20/2016 Discharge date: 09/27/2016  Recommendations for Outpatient Follow-up:  1. Jessica Shepherd, PA-C/PCP in 5 days with repeat labs (CBC, CMP, CK, ESR & CRP). Please consider outpatient rheumatology consultation and follow-up for evaluation of her rhabdomyolysis. Please follow RMSF serology that was sent from the hospital on 09/26/16. 2. Follow-up full TFTs in 4-6 weeks. 3. Dr. Rodell Knox, Orthopedics in 1 week.  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 Thyroid function was normal, diabetes was not well controlled, lipids were elevated and LFTs were somewhat elevated but stable from previous evaluations. Hepatitis profile revealed a positive Hep BsAb, consistent with immunization. Metformin was increased from 500 mg BID to 1000 mg BID and atorvastatin 20 mg was initiated.  She contacted me via My Chart, asking for sliding scale insulin but never sent her home glucose readings, despite repeated requests. She reported swelling of her legs, increasing fatigue and muscle pains, and was asked to come in for re-evaluation repeatedly, but did not. She did stop the atorvastatin about 2 weeks prior to her presentation in the ED.  She was admitted with acute rhabdomyelitis, transaminitis. During hospitalization, she developed compartment syndrome. TSH  was significantly elevated (53), despite reports that she had not missed any doses. No cause for the rhabdomyelitis or transaminitis was identified. RMSF was ultimately negative.  She reports that she DID send me the glucose readings at home. Today I found photos of her glucometer (3 photos on 09/10/2016) in the media section. Reports that she can't get a true fasting glucose reading because she gets up during the night to have a snack and drink milk.  Reports that her upper body strength is significantly improved. RIGHT lower leg remains swollen and tender and painful. She is using a walker at home and presents to me in a wheelchair. Fatigue is improving.  Remains a non-smoker.  In discussing her recent illness, and she recalls that she had run out of her levothyroxine some time before her admission, but doesn't know how long. When she was taking it, she followed her former endocrinologist's instructions, which were to take it at the same time each day. She knows it wasn't always on an empty stomach.   Since hospital discharge, glucose readings at home, have been: All result average for 7 days (4 readings): 149 Lowest: 137 Highest: 196  Her fiance cleaned out the garage this past weekend and found 6-12 black widow spiders under the couch where they sit when he smokes. House is exterminated by a professional service every 4 months.        Review of Systems  Constitutional: Positive for activity change, appetite change and fatigue. Negative for chills, diaphoresis, fever and unexpected weight change.  Gastrointestinal: Negative for constipation, diarrhea (resoled when stopped metformin),  nausea and vomiting.  Endocrine: Positive for cold intolerance.  Genitourinary: Negative for frequency, hematuria and urgency.  Musculoskeletal: Positive for arthralgias, gait problem and myalgias.  Neurological: Positive for weakness. Negative for dizziness, numbness and headaches.    Psychiatric/Behavioral: Positive for sleep disturbance.       Patient Active Problem List   Diagnosis Date Noted  . Cellulitis of right lower leg   . 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 Leg swelling   . Non-traumatic rhabdomyolysis   . Rash 09/21/2016  . Transaminitis 09/21/2016  . Edema 09/20/2016  . Hypothyroidism 07/30/2016  . Essential hypertension 07/30/2016  . Diabetes mellitus type 2 in obese ([place]) 07/30/2016  . Adjustment disorder with mixed anxiety and depressed mood 07/30/2016  . History of colonic polyps 07/30/2016  . Smoker 01/26/2015  . BMI 28.0-28.9,adult 01/26/2015  . Stress incontinence 01/26/2015  . Adnexal pain 12/17/2014  . Abdominal pain 04/24/2013  . Pancreatitis 04/24/2013     Prior to Admission medications   Medication Sig Start Date End Date Taking? Authorizing Provider  ADVANCED FIBER COMPLEX PO Take 1 each by mouth 2 (two) times daily.     Historical Provider, MD  albuterol (PROVENTIL HFA;VENTOLIN HFA) 108 (90 Base) MCG/ACT inhaler Inhale 2 puffs into the lungs every 4 (four) hours as needed for wheezing or shortness of breath (cough, shortness of breath or wheezing.). 08/28/16   Jessica Bernards, PA-C  ALPRAZolam (XANAX) 0.5 MG tablet Take 1 tablet (0.5 mg total) by mouth 2 (two) times daily as needed for anxiety. 09/13/16   Jessica Prevo, PA-C  aspirin EC 81 MG tablet Take 81 mg by mouth daily.    Historical Provider, MD  Biotin w/ Vitamins C & E (HAIR/SKIN/NAILS PO) Take 1 tablet by mouth daily.    Historical Provider, MD  Blood Glucose Monitoring Suppl KIT Check blood sugar daily as needed Patient not taking: Reported on 09/21/2016 08/03/16   Jessica Mackiewicz, PA-C  buPROPion (WELLBUTRIN SR) 150 MG 12 hr tablet Take 1 tablet (150 mg total) by mouth 2 (two) times daily. 08/07/16   Jessica Heider, PA-C  CARTIA XT 180 MG 24 hr capsule TAKE 1 CAPSULE DAILY Patient taking differently: Take 180 mg by mouth daily 09/19/16    Jessica Latorre, PA-C  cephALEXin (KEFLEX) 500 MG capsule Take 1 capsule (500 mg total) by mouth 4 (four) times daily. 09/27/16   Jessica Jansky, MD  cetirizine (ZYRTEC) 10 MG tablet Take 10 mg by mouth daily.    Historical Provider, MD  esomeprazole (NEXIUM) 40 MG capsule TAKE 1 CAPSULE DAILY Patient taking differently: Take 40 mg by mouth daily 09/19/16   Taiwan Talcott, PA-C  estradiol (ESTRACE) 1 MG tablet TAKE 1 TABLET DAILY Patient taking differently: Take 1 mg by mouth daily 09/19/16   Lameshia Hypolite, PA-C  fluticasone (FLONASE) 50 MCG/ACT nasal spray Place 1 spray into both nostrils daily as needed for allergies or rhinitis.    Historical Provider, MD  glipiZIDE (GLUCOTROL) 10 MG tablet Take 1 tablet (10 mg total) by mouth 2 (two) times daily before a meal. 09/12/16   Devereaux Grayson, PA-C  glucose blood test strip Use as instructed Patient not taking: Reported on 09/21/2016 08/28/16   Lataysha Vohra, PA-C  Ipratropium-Albuterol (COMBIVENT) 20-100 MCG/ACT AERS respimat Inhale 1 puff into the lungs every 6 (six) hours. 08/28/16   [place] Flett, PA-C  Lancets (ONETOUCH ULTRASOFT) lancets Test blood sugar daily. Patient not taking: Reported on 09/21/2016 08/17/16   Uno Esau  Jacqulynn Cadet, PA-C  levothyroxine (SYNTHROID, LEVOTHROID) 200 MCG tablet Take 1 tablet (200 mcg total) by mouth daily before breakfast. 09/27/16   Jessica Jansky, MD  meloxicam (MOBIC) 15 MG tablet Take 1 tablet (15 mg total) by mouth daily as needed for pain. 09/27/16   Jessica Jansky, MD  metoprolol succinate (TOPROL-XL) 50 MG 24 hr tablet Take 50 mg by mouth 2 (two) times daily. Take with or immediately following a meal.    Historical Provider, MD  nitrofurantoin (MACRODANTIN) 50 MG capsule TAKE 1 CAPSULE AT BEDTIME Patient taking differently: TAKE 50 MG BY MOUTH AT BEDTIME 09/19/16   Chelle Jeffery, PA-C  ondansetron (ZOFRAN-ODT) 8 MG disintegrating tablet Take 1 tablet (8 mg total) by mouth every 8 (eight) hours as  needed for nausea. 07/19/16   Wardell Honour, MD  oxyCODONE (OXY IR/ROXICODONE) 5 MG immediate release tablet Take 1 tablet (5 mg total) by mouth every 6 (six) hours as needed for severe pain. 09/27/16   Jessica Jansky, MD  valsartan (DIOVAN) 160 MG tablet Take 1 tablet (160 mg total) by mouth daily. 09/28/16   Jessica Jansky, MD     Allergies  Allergen Reactions  . Atorvastatin Swelling and Other (See Comments)    Edema  . Ciprofloxacin Palpitations and Other (See Comments)    Can take in spaced, small doses  . Morphine And Related Itching       Objective:  Physical Exam  Constitutional: She is oriented to person, place, and time. She appears well-developed and well-nourished. She is active and cooperative. No distress.  BP 138/88 (BP Location: Right Arm, Patient Position: Sitting, Cuff Size: Normal)   Pulse 72   Temp 98 F (36.7 C) (Oral)   Resp 16   Ht 4' 11" (1.499 m)   SpO2 96%   HENT:  Head: Normocephalic and atraumatic.  Right Ear: Hearing normal.  Left Ear: Hearing normal.  Eyes: Conjunctivae are normal. No scleral icterus.  Neck: Normal range of motion. Neck supple. No thyromegaly present.  Cardiovascular: Normal rate, regular rhythm and normal heart sounds.   Pulses:      Radial pulses are 2+ on the right side, and 2+ on the left side.  Pulmonary/Chest: Effort normal and breath sounds normal.  Musculoskeletal:       Right lower leg: She exhibits tenderness and edema. She exhibits no bony tenderness.       Left lower leg: Normal.  Fiance has marked the erythema on the RIGHT lower leg. Current erythema does not extend to the marking.   Lymphadenopathy:       Head (right side): No tonsillar, no preauricular, no posterior auricular and no occipital adenopathy present.       Head (left side): No tonsillar, no preauricular, no posterior auricular and no occipital adenopathy present.    She has no cervical adenopathy.       Right: No supraclavicular adenopathy  present.       Left: No supraclavicular adenopathy present.  Neurological: She is alert and oriented to person, place, and time. No sensory deficit.  Skin: Skin is warm, dry and intact. No rash noted. No cyanosis or erythema. Nails show no clubbing.  Psychiatric: She has a normal mood and affect. Her speech is normal and behavior is normal.           Assessment & Plan:   1. Non-traumatic rhabdomyolysis 2. Transaminitis Unclear cause. Proceed with referral to rheumatology, and follow-up here pending lab trends. -  CBC with Differential/Platelet - Comprehensive metabolic panel - Sedimentation rate - C-reactive protein  3. Cellulitis of right lower leg Significantly improved. Follow-up with Dr. Lorin Mercy as planned 10/05/2016  4. Hypothyroidism, unspecified type Will repeat thyroid function testing in 4 weeks.   Fara Chute, PA-C Physician Assistant-Certified [facility]

## 2016-10-03 LAB — CBC WITH DIFFERENTIAL/PLATELET
BASOS PCT: 0 %
Basophils Absolute: 0 cells/uL (ref 0–200)
Eosinophils Absolute: 273 cells/uL (ref 15–500)
Eosinophils Relative: 3 %
HCT: 37.6 % (ref 35.0–45.0)
Hemoglobin: 12.7 g/dL (ref 11.7–15.5)
LYMPHS PCT: 20 %
Lymphs Abs: 1820 cells/uL (ref 850–3900)
MCH: 31.4 pg (ref 27.0–33.0)
MCHC: 33.8 g/dL (ref 32.0–36.0)
MCV: 93.1 fL (ref 80.0–100.0)
MONOS PCT: 7 %
MPV: 8.9 fL (ref 7.5–12.5)
Monocytes Absolute: 637 cells/uL (ref 200–950)
NEUTROS ABS: 6370 {cells}/uL (ref 1500–7800)
Neutrophils Relative %: 70 %
PLATELETS: 431 10*3/uL — AB (ref 140–400)
RBC: 4.04 MIL/uL (ref 3.80–5.10)
RDW: 14.6 % (ref 11.0–15.0)
WBC: 9.1 10*3/uL (ref 3.8–10.8)

## 2016-10-03 LAB — COMPREHENSIVE METABOLIC PANEL
ALK PHOS: 99 U/L (ref 33–115)
ALT: 57 U/L — AB (ref 6–29)
AST: 69 U/L — AB (ref 10–35)
Albumin: 4.3 g/dL (ref 3.6–5.1)
BILIRUBIN TOTAL: 0.5 mg/dL (ref 0.2–1.2)
BUN: 15 mg/dL (ref 7–25)
CO2: 25 mmol/L (ref 20–31)
CREATININE: 0.82 mg/dL (ref 0.50–1.10)
Calcium: 9.3 mg/dL (ref 8.6–10.2)
Chloride: 102 mmol/L (ref 98–110)
GLUCOSE: 155 mg/dL — AB (ref 65–99)
Potassium: 4.1 mmol/L (ref 3.5–5.3)
SODIUM: 139 mmol/L (ref 135–146)
TOTAL PROTEIN: 6.9 g/dL (ref 6.1–8.1)

## 2016-10-04 LAB — SEDIMENTATION RATE: Sed Rate: 14 mm/hr (ref 0–20)

## 2016-10-04 LAB — C-REACTIVE PROTEIN: CRP: 17.1 mg/L — ABNORMAL HIGH (ref <8.0)

## 2016-10-04 NOTE — Addendum Note (Signed)
Addended by: Fernande BrasJEFFERY, Ismail Graziani S on: 10/04/2016 08:57 AM   Modules accepted: Orders

## 2016-10-05 ENCOUNTER — Encounter: Payer: Self-pay | Admitting: Physician Assistant

## 2016-10-05 ENCOUNTER — Encounter (INDEPENDENT_AMBULATORY_CARE_PROVIDER_SITE_OTHER): Payer: Self-pay | Admitting: Orthopaedic Surgery

## 2016-10-05 ENCOUNTER — Ambulatory Visit (INDEPENDENT_AMBULATORY_CARE_PROVIDER_SITE_OTHER): Payer: BLUE CROSS/BLUE SHIELD | Admitting: Orthopaedic Surgery

## 2016-10-05 VITALS — BP 134/88 | HR 67 | Ht 59.75 in | Wt 165.0 lb

## 2016-10-05 DIAGNOSIS — T79A21D Traumatic compartment syndrome of right lower extremity, subsequent encounter: Secondary | ICD-10-CM | POA: Diagnosis not present

## 2016-10-05 LAB — CK: Total CK: 592 U/L — ABNORMAL HIGH (ref 7–177)

## 2016-10-05 MED ORDER — HYDROCODONE-ACETAMINOPHEN 5-325 MG PO TABS: 1.0000 | ORAL_TABLET | Freq: Four times a day (QID) | ORAL | 0 refills | Status: DC | PRN

## 2016-10-05 NOTE — Progress Notes (Signed)
Office Visit Note   Patient: Jessica Knox           Date of Birth: Feb 04, 1966           MRN: [id number] Visit Date: 10/05/2016              Requested by: Porfirio Oarhelle Jeffery, PA-C [address] [place], [place] [id number] PCP: Porfirio Oarhelle Jeffery, PA-C   Assessment & Plan: Visit Diagnoses: NONTRAUMATIC COMPARTMENT SYNDROME RIGHT PERONEAL COMPARTMENT.  2.  RHABOMYOLYSIS. WITH ELEVATED CPK Plan: Patient needs to elevate her leg more. Patient needs apply some lotion to her leg keep her leg elevated more she'll return in 3 weeks. We'll consider applying a compression stocking at that time to help strong of her foot. Flushes for elevation continue to work on some ankle pumps. We discussed on return visit ordering off shelf AFO to help with ambulation. Currently she can put some weight on her foot partial weightbearing and continues need to elevate her leg to decrease the swelling.  Follow-Up Instructions: Return in about 3 weeks (around 10/26/2016).   Orders:  No orders of the defined types were placed in this encounter.  No orders of the defined types were placed in this encounter.     Procedures: No procedures performed   Clinical Data: No additional findings.   Subjective: Chief Complaint  Patient presents with  . Right Leg - Pain, Edema    Patient was referred for right leg tenderness/swelling and cellulitis. Patient was recently admitted to the hospital for rhabdomyelitis and transaminitis. She states that she developed this after taking statins. At the point that she went to the hospital, she was having symptoms that mimick DVT. She was taking Keflex which she finished. She was also given Oxycodone 5mg  which she has run out of. She does complain of pain in her lower leg and foot. Patient is weightbearing only as needed. She rented knee walker from [facility] which has helped tremendously. She would like script sent to them for knee walker so that it will possibly be covered by  insurance.     Review of Systems Unchanged from hospitalization.  Objective: Vital Signs: BP 134/88   Pulse 67   Ht 4' 11.75" (1.518 m)   Wt 165 lb (74.8 kg)   BMI 32.49 kg/m   Physical Exam  Constitutional: She is oriented to person, place, and time. She appears well-developed.  HENT:  Head: Normocephalic.  Right Ear: External ear normal.  Left Ear: External ear normal.  Eyes: Pupils are equal, round, and reactive to light.  Neck: No tracheal deviation present. No thyromegaly present.  Cardiovascular: Normal rate.   Pulmonary/Chest: Effort normal.  Abdominal: Soft.  Musculoskeletal:  Patient has some numbness of the dorsum of the right foot. She has some swelling of her foot and ankle admits she's not been elevating her leg is much aside asked her to.. Compartment remains firm. There is some decrease in the tightness of the compartment. Capillary refill is good distal pulses are 2+.  Neurological: She is alert and oriented to person, place, and time.  Skin: Skin is warm and dry.  Psychiatric: She has a normal mood and affect. Her behavior is normal.    Ortho Exam patient's anterior compartment is soft.. Compartment remains firm and she has peroneal weakness. Peroneal compartment is not as hard as when she was in the hospital. She remains with ankle dorsiflexion weakness.  Specialty Comments:  No specialty comments available.  Imaging: No results found.  PMFS History: Patient Active Problem List   Diagnosis Date Noted  . Cellulitis of right lower leg   . 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 Leg swelling   . Non-traumatic rhabdomyolysis   . Rash 09/21/2016  . Transaminitis 09/21/2016  . Edema 09/20/2016  . Hypothyroidism 07/30/2016  . Essential hypertension 07/30/2016  . Diabetes mellitus type 2 in obese (HCC) 07/30/2016  . Adjustment disorder with mixed anxiety and depressed mood 07/30/2016  . History of colonic polyps 07/30/2016    . Smoker 01/26/2015  . BMI 28.0-28.9,adult 01/26/2015  . Stress incontinence 01/26/2015  . Adnexal pain 12/17/2014  . Abdominal pain 04/24/2013  . Pancreatitis 04/24/2013   Past Medical History:  Diagnosis Date  . Diabetes mellitus without complication (HCC)   . GERD (gastroesophageal reflux disease)   . Hypertension   . SVT (supraventricular tachycardia) (HCC)   . Thyroid disease    multinodular thyroid total thyroidectomy    Family History  Problem Relation Age of Onset  . Adopted: Yes  . Family history unknown: Yes    Past Surgical History:  Procedure Laterality Date  . ABDOMINAL HYSTERECTOMY    . CHOLECYSTECTOMY    . RIGHT OOPHORECTOMY  12/06/2012  . RIGHT OOPHORECTOMY Right   . ROBOTIC ASSISTED DIAGNOSTIC LAPAROSCOPY  01/10/2015  . THYROIDECTOMY     Social History   Occupational History  . Not on file.   Social History Main Topics  . Smoking status: Former Smoker    Packs/day: 1.00    Years: 30.00    Types: Cigarettes    Quit date: 08/02/2016  . Smokeless tobacco: Never Used  . Alcohol use 0.0 oz/week  . Drug use: No  . Sexual activity: Not on file

## 2016-10-16 ENCOUNTER — Encounter: Payer: Self-pay | Admitting: Physician Assistant

## 2016-10-17 ENCOUNTER — Telehealth (INDEPENDENT_AMBULATORY_CARE_PROVIDER_SITE_OTHER): Payer: Self-pay | Admitting: Orthopaedic Surgery

## 2016-10-17 NOTE — Telephone Encounter (Signed)
I called. [employer] rx for AFO ordered. She has appt for new labs with PCP and AFO Rx done she will pick up. In my outbox I will put on your desk Thursday AM . FYI thanks

## 2016-10-17 NOTE — Telephone Encounter (Signed)
Patient states her pain medication is not working and request something else.

## 2016-10-17 NOTE — Telephone Encounter (Signed)
Please advise 

## 2016-10-18 NOTE — Telephone Encounter (Signed)
Put at front desk for pick up

## 2016-10-22 ENCOUNTER — Telehealth (INDEPENDENT_AMBULATORY_CARE_PROVIDER_SITE_OTHER): Payer: Self-pay | Admitting: *Deleted

## 2016-10-22 DIAGNOSIS — T79A21D Traumatic compartment syndrome of right lower extremity, subsequent encounter: Secondary | ICD-10-CM

## 2016-10-22 MED ORDER — HYDROCODONE-ACETAMINOPHEN 5-325 MG PO TABS: 1.0000 | ORAL_TABLET | Freq: Four times a day (QID) | ORAL | 0 refills | Status: DC | PRN

## 2016-10-22 NOTE — Telephone Encounter (Signed)
Please advise 

## 2016-10-22 NOTE — Telephone Encounter (Signed)
Ok refill # 30     I will sign thanks

## 2016-10-22 NOTE — Telephone Encounter (Signed)
Pt called stating she needs a Rx refill of norco.

## 2016-10-23 ENCOUNTER — Ambulatory Visit (INDEPENDENT_AMBULATORY_CARE_PROVIDER_SITE_OTHER): Payer: BLUE CROSS/BLUE SHIELD | Admitting: Physician Assistant

## 2016-10-23 ENCOUNTER — Encounter (INDEPENDENT_AMBULATORY_CARE_PROVIDER_SITE_OTHER): Payer: Self-pay | Admitting: Orthopaedic Surgery

## 2016-10-23 ENCOUNTER — Encounter: Payer: Self-pay | Admitting: Physician Assistant

## 2016-10-23 VITALS — BP 118/82 | HR 92 | Temp 97.7°F | Resp 16 | Ht 59.5 in | Wt 165.0 lb

## 2016-10-23 DIAGNOSIS — R531 Weakness: Secondary | ICD-10-CM | POA: Diagnosis not present

## 2016-10-23 DIAGNOSIS — E039 Hypothyroidism, unspecified: Secondary | ICD-10-CM | POA: Diagnosis not present

## 2016-10-23 DIAGNOSIS — R740 Nonspecific elevation of levels of transaminase and lactic acid dehydrogenase [LDH]: Secondary | ICD-10-CM

## 2016-10-23 DIAGNOSIS — M6282 Rhabdomyolysis: Secondary | ICD-10-CM

## 2016-10-23 DIAGNOSIS — E669 Obesity, unspecified: Secondary | ICD-10-CM | POA: Diagnosis not present

## 2016-10-23 DIAGNOSIS — R7401 Elevation of levels of liver transaminase levels: Secondary | ICD-10-CM

## 2016-10-23 DIAGNOSIS — F4323 Adjustment disorder with mixed anxiety and depressed mood: Secondary | ICD-10-CM | POA: Diagnosis not present

## 2016-10-23 DIAGNOSIS — E1169 Type 2 diabetes mellitus with other specified complication: Secondary | ICD-10-CM

## 2016-10-23 LAB — POCT GLYCOSYLATED HEMOGLOBIN (HGB A1C): Hemoglobin A1C: 8

## 2016-10-23 MED ORDER — ALPRAZOLAM 0.5 MG PO TABS: 0.5000 mg | ORAL_TABLET | Freq: Two times a day (BID) | ORAL | 0 refills | Status: DC | PRN

## 2016-10-23 NOTE — Patient Instructions (Signed)
     IF you received an x-ray today, you will receive an invoice from [facility]. Please contact [facility] at [phone] with questions or concerns regarding your invoice.   IF you received labwork today, you will receive an invoice from [facility]. Please contact [facility] at [phone] with questions or concerns regarding your invoice.   Our billing staff will not be able to assist you with questions regarding bills from these companies.  You will be contacted with the lab results as soon as they are available. The fastest way to get your results is to activate your My Chart account. Instructions are located on the last page of this paperwork. If you have not heard from us regarding the results in 2 weeks, please contact this office.     

## 2016-10-23 NOTE — Progress Notes (Signed)
Patient ID: Jessica Knox, female    DOB: [birth year], 50 y.o.   MRN: [id number]  PCP: Harrison Mons, PA-C  Chief Complaint  Patient presents with  . Follow-up    4 wk f/u and labs     Subjective:   Presents for evaluation of hypothyroidism, non-traumatic rhabdomyolysis, transaminitis and compartment syndrome, as well as diabetes.  Better, but not as well as desired.  In general is weak and has poor exercise tolerance. Tolerating her medications well. 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  Getting an AFO for RIGHT foot. Knee walker much easier to use than the rolling walker. Dr. Lorin Mercy said she no longer needs PT. Next visit with him is 11/09/2016 (r/s from 12/29) She can't drive yet, and so isn't interested in other PT yet. Poor upper body strength and endurance.  Norco isn't helping enough. Desires to go back to oxycodone. Feels like a corkscrew in her leg/foot. Dr. Lorin Mercy is managing her pain, and we agree that I will not prescribe pain control at this time.  A1C 08/01/2016 was 8.6%, up from 7.0%.  Review of Systems  Constitutional: Positive for fatigue. Negative for chills and fever.  Respiratory: Negative for cough, shortness of breath and wheezing.   Cardiovascular: Positive for leg swelling (improving). Negative for chest pain and palpitations.  Endocrine: Negative for cold intolerance, heat intolerance, polydipsia, polyphagia and polyuria.  Genitourinary: Negative for dysuria, frequency and urgency.  Musculoskeletal: Positive for arthralgias, gait problem, joint swelling and myalgias. Negative for neck pain and neck stiffness.  Skin: Negative.   Neurological: Positive for weakness (generalized poor exercise tolerance). Negative for dizziness.  Hematological: Negative for adenopathy. Does not bruise/bleed easily.  Psychiatric/Behavioral: Positive for dysphoric mood. Negative for behavioral problems, confusion and hallucinations. The  patient is nervous/anxious.        Patient Active Problem List   Diagnosis Date Noted  . Cellulitis of right lower leg   . 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 Leg swelling   . Non-traumatic rhabdomyolysis   . Rash 09/21/2016  . Transaminitis 09/21/2016  . Edema 09/20/2016  . Hypothyroidism 07/30/2016  . Essential hypertension 07/30/2016  . Diabetes mellitus type 2 in obese ([place]) 07/30/2016  . Adjustment disorder with mixed anxiety and depressed mood 07/30/2016  . History of colonic polyps 07/30/2016  . Smoker 01/26/2015  . BMI 28.0-28.9,adult 01/26/2015  . Stress incontinence 01/26/2015  . Adnexal pain 12/17/2014  . Abdominal pain 04/24/2013  . Pancreatitis 04/24/2013     Prior to Admission medications   Medication Sig Start Date End Date Taking? Authorizing Provider  ADVANCED FIBER COMPLEX PO Take 1 each by mouth 2 (two) times daily.    Yes Historical Provider, MD  albuterol (PROVENTIL HFA;VENTOLIN HFA) 108 (90 Base) MCG/ACT inhaler Inhale 2 puffs into the lungs every 4 (four) hours as needed for wheezing or shortness of breath (cough, shortness of breath or wheezing.). 08/28/16  Yes Braniyah Besse, PA-C  ALPRAZolam (XANAX) 0.5 MG tablet Take 1 tablet (0.5 mg total) by mouth 2 (two) times daily as needed for anxiety. 09/13/16  Yes Halli Equihua, PA-C  aspirin EC 81 MG tablet Take 81 mg by mouth daily.   Yes Historical Provider, MD  Biotin w/ Vitamins C & E (HAIR/SKIN/NAILS PO) Take 1 tablet by mouth daily.   Yes Historical Provider, MD  Blood Glucose Monitoring Suppl KIT Check blood sugar daily as needed 08/03/16  Yes Harrison Mons, PA-C  buPROPion (WELLBUTRIN SR) 150 MG 12 hr tablet Take 1 tablet (150 mg total) by mouth 2 (two) times daily. 08/07/16  Yes Talissa Apple, PA-C  CARTIA XT 180 MG 24 hr capsule TAKE 1 CAPSULE DAILY Patient taking differently: Take 180 mg by mouth daily 09/19/16  Yes Jolly Bleicher, PA-C  cetirizine (ZYRTEC) 10 MG  tablet Take 10 mg by mouth daily.   Yes Historical Provider, MD  esomeprazole (NEXIUM) 40 MG capsule TAKE 1 CAPSULE DAILY Patient taking differently: Take 40 mg by mouth daily 09/19/16  Yes Breanna Mcdaniel, PA-C  estradiol (ESTRACE) 1 MG tablet TAKE 1 TABLET DAILY Patient taking differently: Take 1 mg by mouth daily 09/19/16  Yes Iolani Twilley, PA-C  fluticasone (FLONASE) 50 MCG/ACT nasal spray Place 1 spray into both nostrils daily as needed for allergies or rhinitis.   Yes Historical Provider, MD  glipiZIDE (GLUCOTROL) 10 MG tablet Take 1 tablet (10 mg total) by mouth 2 (two) times daily before a meal. 09/12/16  Yes Lucinda Spells, PA-C  glucose blood test strip Use as instructed 08/28/16  Yes Wrenley Sayed, PA-C  HYDROcodone-acetaminophen (NORCO) 5-325 MG tablet Take 1 tablet by mouth every 6 (six) hours as needed for moderate pain. 10/22/16  Yes Marybelle Killings, MD  Ipratropium-Albuterol (COMBIVENT) 20-100 MCG/ACT AERS respimat Inhale 1 puff into the lungs every 6 (six) hours. 08/28/16  Yes Meeah Totino, PA-C  Lancets (ONETOUCH ULTRASOFT) lancets Test blood sugar daily. 08/17/16  Yes Calli Bashor, PA-C  levothyroxine (SYNTHROID, LEVOTHROID) 200 MCG tablet Take 1 tablet (200 mcg total) by mouth daily before breakfast. 09/27/16  Yes Modena Jansky, MD  meloxicam (MOBIC) 15 MG tablet Take 1 tablet (15 mg total) by mouth daily as needed for pain. 09/27/16  Yes Modena Jansky, MD  metoprolol succinate (TOPROL-XL) 50 MG 24 hr tablet Take 50 mg by mouth 2 (two) times daily. Take with or immediately following a meal.   Yes Historical Provider, MD  nitrofurantoin (MACRODANTIN) 50 MG capsule TAKE 1 CAPSULE AT BEDTIME Patient taking differently: TAKE 50 MG BY MOUTH AT BEDTIME 09/19/16  Yes Alicja Everitt, PA-C  ondansetron (ZOFRAN-ODT) 8 MG disintegrating tablet Take 1 tablet (8 mg total) by mouth every 8 (eight) hours as needed for nausea. 07/19/16  Yes Wardell Honour, MD  oxyCODONE (OXY  IR/ROXICODONE) 5 MG immediate release tablet Take 1 tablet (5 mg total) by mouth every 6 (six) hours as needed for severe pain. 09/27/16  Yes Modena Jansky, MD  valsartan (DIOVAN) 160 MG tablet Take 1 tablet (160 mg total) by mouth daily. 09/28/16  Yes Modena Jansky, MD  cephALEXin (KEFLEX) 500 MG capsule Take 1 capsule (500 mg total) by mouth 4 (four) times daily. Patient not taking: Reported on 10/23/2016 09/27/16   Modena Jansky, MD     Allergies  Allergen Reactions  . Atorvastatin Swelling and Other (See Comments)    Edema  . Ciprofloxacin Palpitations and Other (See Comments)    Can take in spaced, small doses  . Morphine And Related Itching       Objective:  Physical Exam  Constitutional: She is oriented to person, place, and time. She appears well-developed and well-nourished. She is active and cooperative. No distress.  BP 118/82 (BP Location: Left Arm, Patient Position: Sitting, Cuff Size: Small)   Pulse 92   Temp 97.7 F (36.5 C) (Oral)   Resp 16   Ht 4' 11.5" (1.511 m)   Wt 165 lb (74.8 kg)  SpO2 96%   BMI 32.77 kg/m   HENT:  Head: Normocephalic and atraumatic.  Right Ear: Hearing normal.  Left Ear: Hearing normal.  Eyes: Conjunctivae are normal. No scleral icterus.  Neck: Normal range of motion. Neck supple. No thyromegaly present.  Cardiovascular: Normal rate, regular rhythm and normal heart sounds.   Pulses:      Radial pulses are 2+ on the right side, and 2+ on the left side.  Pulmonary/Chest: Effort normal and breath sounds normal.  Musculoskeletal:  Persistent swelling of the RIGHT foot and lower leg  Lymphadenopathy:       Head (right side): No tonsillar, no preauricular, no posterior auricular and no occipital adenopathy present.       Head (left side): No tonsillar, no preauricular, no posterior auricular and no occipital adenopathy present.    She has no cervical adenopathy.       Right: No supraclavicular adenopathy present.       Left:  No supraclavicular adenopathy present.  Neurological: She is alert and oriented to person, place, and time. No sensory deficit.  Skin: Skin is warm, dry and intact. No rash noted. No cyanosis or erythema. Nails show no clubbing.  Psychiatric: She has a normal mood and affect. Her speech is normal and behavior is normal.    Results for orders placed or performed in visit on 10/23/16  POCT glycosylated hemoglobin (Hb A1C)  Result Value Ref Range   Hemoglobin A1C 8.0           Assessment & Plan:   1. Non-traumatic rhabdomyolysis Has been improving. Continue to follow. - CK  2. Diabetes mellitus type 2 in obese (HCC) A1C is improved. Agree to continue current treatment and consider additional agent in 3 months. - POCT glycosylated hemoglobin (Hb A1C)  3. Hypothyroidism, unspecified type Await labs. Adjust regimen as indicated by results. - TSH - T4, Free  4. Transaminitis Has been improving. Continue to follow. - Comprehensive metabolic panel  5. Adjustment disorder with mixed anxiety and depressed mood Anticipate symptoms will improve as her mobility, strength and exercise tolerance improve. - ALPRAZolam (XANAX) 0.5 MG tablet; Take 1 tablet (0.5 mg total) by mouth 2 (two) times daily as needed for anxiety.  Dispense: 60 tablet; Refill: 0  6. Weakness generalized Needs increased core strength and exercise tolerance. Refer to PT for HEP. - Ambulatory referral to Physical Therapy  Return in about 3 months (around 01/21/2017) for re-evaluation of diabetes, cholesterol and thyroid.   Fara Chute, PA-C Physician Assistant-Certified [facility]

## 2016-10-23 NOTE — Telephone Encounter (Signed)
I called patient left a voicemail to advise rx is at front desk for pick up.

## 2016-10-24 ENCOUNTER — Telehealth (INDEPENDENT_AMBULATORY_CARE_PROVIDER_SITE_OTHER): Payer: Self-pay | Admitting: Orthopaedic Surgery

## 2016-10-24 LAB — COMPREHENSIVE METABOLIC PANEL
ALT: 60 IU/L — AB (ref 0–32)
AST: 51 IU/L — ABNORMAL HIGH (ref 0–40)
Albumin/Globulin Ratio: 1.9 (ref 1.2–2.2)
Albumin: 4.3 g/dL (ref 3.5–5.5)
Alkaline Phosphatase: 95 IU/L (ref 39–117)
BILIRUBIN TOTAL: 0.2 mg/dL (ref 0.0–1.2)
BUN/Creatinine Ratio: 24 — ABNORMAL HIGH (ref 9–23)
BUN: 15 mg/dL (ref 6–24)
CALCIUM: 9.2 mg/dL (ref 8.7–10.2)
CHLORIDE: 96 mmol/L (ref 96–106)
CO2: 22 mmol/L (ref 18–29)
Creatinine, Ser: 0.63 mg/dL (ref 0.57–1.00)
GFR, EST AFRICAN AMERICAN: 123 mL/min/{1.73_m2} (ref >59)
GFR, EST NON AFRICAN AMERICAN: 106 mL/min/{1.73_m2} (ref >59)
GLUCOSE: 191 mg/dL — AB (ref 65–99)
Globulin, Total: 2.3 g/dL (ref 1.5–4.5)
Potassium: 4.4 mmol/L (ref 3.5–5.2)
Sodium: 137 mmol/L (ref 134–144)
TOTAL PROTEIN: 6.6 g/dL (ref 6.0–8.5)

## 2016-10-24 LAB — CK: CK TOTAL: 71 U/L (ref 24–173)

## 2016-10-24 LAB — TSH: TSH: 7.76 u[IU]/mL — ABNORMAL HIGH (ref 0.450–4.500)

## 2016-10-24 LAB — T4, FREE: FREE T4: 1.35 ng/dL (ref 0.82–1.77)

## 2016-10-24 NOTE — Telephone Encounter (Signed)
noted 

## 2016-10-24 NOTE — Telephone Encounter (Signed)
Please advise 

## 2016-10-24 NOTE — Telephone Encounter (Signed)
IT WAS   AFO not cam boot. I called discussed. [facility] said [facility]

## 2016-10-24 NOTE — Telephone Encounter (Signed)
Patient went to [facility] yesterday and the girl working there was unable to help her.  Patient went in with script but did not have it when she came out.  The boot was not available and the girl working said it would be after the first of the year before they would have the boot. Pt has not been mobile since early November   Patient is requesting a script to be faxed to the [facility].  Fax  [phone] and phone  [phone]. She wants yates to advise if she is to have the boot that is just below knee or just above ankle.  She has also seen some online for (approx $70) but does not know if Insurance will cover the ones online.  She is asking.Marland Kitchen.Marland Kitchen.Does patient have to be fitted???  Please advise

## 2016-10-24 NOTE — Telephone Encounter (Signed)
Is there another diagnosis code that you would like for me to try?

## 2016-10-25 ENCOUNTER — Telehealth (INDEPENDENT_AMBULATORY_CARE_PROVIDER_SITE_OTHER): Payer: Self-pay | Admitting: *Deleted

## 2016-10-25 NOTE — Telephone Encounter (Signed)
Try rhabdomyolysis

## 2016-10-25 NOTE — Telephone Encounter (Signed)
I spoke with Marylu LundJanet and gave diagnosis code for rhabdomyolysis- M62.82

## 2016-10-25 NOTE — Telephone Encounter (Signed)
Pt called asking if she can get in hot tub this weekend.

## 2016-10-26 NOTE — Telephone Encounter (Signed)
OK. ucall

## 2016-10-26 NOTE — Telephone Encounter (Signed)
LMOM for patient of the below message  

## 2016-10-26 NOTE — Telephone Encounter (Signed)
Please advise 

## 2016-10-26 NOTE — Telephone Encounter (Signed)
Can you call patient and advise 

## 2016-10-31 ENCOUNTER — Encounter: Payer: Self-pay | Admitting: Physician Assistant

## 2016-10-31 DIAGNOSIS — E034 Atrophy of thyroid (acquired): Secondary | ICD-10-CM

## 2016-10-31 MED ORDER — VALSARTAN 160 MG PO TABS: 160.0000 mg | ORAL_TABLET | Freq: Every day | ORAL | 1 refills | Status: DC

## 2016-10-31 MED ORDER — GLIPIZIDE 10 MG PO TABS: 10.0000 mg | ORAL_TABLET | Freq: Two times a day (BID) | ORAL | 1 refills | Status: DC

## 2016-10-31 MED ORDER — LEVOTHYROXINE SODIUM 200 MCG PO TABS: 200.0000 ug | ORAL_TABLET | Freq: Every day | ORAL | 1 refills | Status: DC

## 2016-11-02 ENCOUNTER — Telehealth: Payer: Self-pay

## 2016-11-02 ENCOUNTER — Ambulatory Visit (INDEPENDENT_AMBULATORY_CARE_PROVIDER_SITE_OTHER): Payer: BLUE CROSS/BLUE SHIELD | Admitting: Orthopaedic Surgery

## 2016-11-02 NOTE — Telephone Encounter (Signed)
Pt is needing to talk with someone about getting her blood pressure medication sent for a 30 day supply to [facility]  She still needs the one that rose sent to mail order but she is completely out and needs this taken care of   Best number [phone]

## 2016-11-06 MED ORDER — VALSARTAN 160 MG PO TABS: 160.0000 mg | ORAL_TABLET | Freq: Every day | ORAL | 0 refills | Status: DC

## 2016-11-06 NOTE — Telephone Encounter (Signed)
Tried to reach pt to verify which BP med is needed, but got VM. LMOM that I will send in the valsartan which is the one that Rose just sent in to mail order. Asked for CB if others are needed.

## 2016-11-09 ENCOUNTER — Encounter (INDEPENDENT_AMBULATORY_CARE_PROVIDER_SITE_OTHER): Payer: Self-pay | Admitting: Orthopaedic Surgery

## 2016-11-09 ENCOUNTER — Ambulatory Visit (INDEPENDENT_AMBULATORY_CARE_PROVIDER_SITE_OTHER): Payer: BLUE CROSS/BLUE SHIELD | Admitting: Orthopaedic Surgery

## 2016-11-09 VITALS — Ht 59.75 in | Wt 165.0 lb

## 2016-11-09 DIAGNOSIS — M6282 Rhabdomyolysis: Secondary | ICD-10-CM

## 2016-11-09 NOTE — Progress Notes (Signed)
Office Visit Note   Patient: Jessica Knox           Date of Birth: [birth year]           MRN: [id number] Visit Date: 11/09/2016              Requested by: Porfirio Oarhelle Jeffery, PA-C [address] [place], [place] [id number] PCP: Porfirio Oarhelle Jeffery, PA-C   Assessment & Plan: Visit Diagnoses:  1. Non-traumatic rhabdomyolysis            Compartment syndrome right leg secondary to rhabdomyolysis.  Partial foot drop.   Plan: At this point I would encourage patient to use her AFO and using a roller walker or 4 prong cane to continue to work on ankle dorsiflexion strength with exercises. She can rub her foot with lotion several times a day to help with sensitivity. She's got some gradual improvement in her ankle dorsiflexion and should continue to improve. We discussed that she been on pain medication since November and despite less pain when she is taking stronger narcotic medication we discussed we need to continue to wean her pain medication. I will recheck her in one month. Ultram for pain. She started on Percocet and then was on Norco and Nothstein to progress the Ultram. Will apply some compression stockings for the mild swelling she has at the end of the day.  Follow-Up Instructions: Return in about 1 month (around 12/10/2016).   Orders:  No orders of the defined types were placed in this encounter.  No orders of the defined types were placed in this encounter.     Procedures: No procedures performed   Clinical Data: No additional findings.   Subjective: Chief Complaint  Patient presents with  . Right Leg - Follow-up    Patient returns for follow up right leg, rhabdomyoloysis and compartment syndrome right peroneal compartment.  She states that she fell last week. She is wearing her son's fracture boot which she states feels better than what she picked up from [facility]. She did fall last week. She continues to have pain when anything touches her leg.  Patient's using a cam boot and a  roller. She has an AFO but states It feels better and she feels more sturdy with that.  Review of Systems 14 point review of systems updated and is unchanged from hospitalization 09/20/2016. Patient had elevated CPK possibly related to the medication. Swelling of hands and arms and leg.   Objective: Vital Signs: Ht 4' 11.75" (1.518 m)   Wt 165 lb (74.8 kg)   BMI 32.49 kg/m   Physical Exam  Constitutional: She is oriented to person, place, and time. She appears well-developed.  HENT:  Head: Normocephalic.  Right Ear: External ear normal.  Left Ear: External ear normal.  Eyes: Pupils are equal, round, and reactive to light.  Neck: No tracheal deviation present. No thyromegaly present.  Cardiovascular: Normal rate.   Pulmonary/Chest: Effort normal.  Abdominal: Soft.  Musculoskeletal:  Patient has weakness anterior tib and some weakness of EHL with partial foot drop. She can dorsiflex her toes and her ankle. She has good gastrocsoleus strength. Has some numbness of the dorsum of her foot. She states it feels tingly and is hypersensitive.  Neurological: She is alert and oriented to person, place, and time.  Skin: Skin is warm and dry.  Psychiatric: She has a normal mood and affect. Her behavior is normal.    Ortho Exam reflexes are intact no rash over exposed skin. Compartment laterally  and anterior palpation appear normal.  Specialty Comments:  No specialty comments available.  Imaging: No results found.   PMFS History: Patient Active Problem List   Diagnosis Date Noted  . Cellulitis of right lower leg   . 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 Leg swelling   . Non-traumatic rhabdomyolysis   . Rash 09/21/2016  . Transaminitis 09/21/2016  . Edema 09/20/2016  . Hypothyroidism 07/30/2016  . Essential hypertension 07/30/2016  . Diabetes mellitus type 2 in obese ([facility]) 07/30/2016  . Adjustment disorder with mixed anxiety and depressed mood  07/30/2016  . History of colonic polyps 07/30/2016  . Smoker 01/26/2015  . BMI 28.0-28.9,adult 01/26/2015  . Stress incontinence 01/26/2015  . Adnexal pain 12/17/2014  . Abdominal pain 04/24/2013  . Pancreatitis 04/24/2013   Past Medical History:  Diagnosis Date  . Diabetes mellitus without complication (HCC)   . GERD (gastroesophageal reflux disease)   . Hypertension   . SVT (supraventricular tachycardia) (HCC)   . Thyroid disease    multinodular thyroid total thyroidectomy    Family History  Problem Relation Age of Onset  . Adopted: Yes  . Family history unknown: Yes    Past Surgical History:  Procedure Laterality Date  . ABDOMINAL HYSTERECTOMY    . CHOLECYSTECTOMY    . RIGHT OOPHORECTOMY  12/06/2012  . RIGHT OOPHORECTOMY Right   . ROBOTIC ASSISTED DIAGNOSTIC LAPAROSCOPY  01/10/2015  . THYROIDECTOMY     Social History   Occupational History  . Not on file.   Social History Main Topics  . Smoking status: Former Smoker    Packs/day: 1.00    Years: 30.00    Types: Cigarettes    Quit date: 08/02/2016  . Smokeless tobacco: Never Used  . Alcohol use 0.0 oz/week  . Drug use: No  . Sexual activity: Not on file

## 2016-11-11 ENCOUNTER — Other Ambulatory Visit: Payer: Self-pay | Admitting: Physician Assistant

## 2016-11-20 ENCOUNTER — Telehealth: Payer: Self-pay

## 2016-11-20 ENCOUNTER — Telehealth: Payer: Self-pay | Admitting: Family Medicine

## 2016-11-20 MED ORDER — DAPAGLIFLOZIN PROPANEDIOL 5 MG PO TABS: 5.0000 mg | ORAL_TABLET | Freq: Every day | ORAL | 3 refills | Status: DC

## 2016-11-20 NOTE — Telephone Encounter (Signed)
Dr. Ophelia CharterYates,  This patient contacted me advising me that you want her started on Lantus insulin at 5 units. I do not see that in your most recent note or other communication with her, and wanted to verify with you before starting her on insulin.  Warmly, Vrinda Heckstall

## 2016-11-20 NOTE — Telephone Encounter (Signed)
farxiga pa needed  Form in your mail box

## 2016-11-20 NOTE — Telephone Encounter (Signed)
Spoke with Dr. Ophelia CharterYates. Recommended that she discuss insulin with me.  As I have discussed with her previously, making the lifestyle changes will help. She is no longer on metformin, due to recent rhabdomyolysis.  She needs improved glucose control (last A1C was 8% on 10/23/2016). Due to anticipated weight gain with insulin, I would recommend adding another oral agent before insulin. Will recommend to this patient.

## 2016-11-20 NOTE — Telephone Encounter (Signed)
Meds ordered this encounter  Medications  . dapagliflozin propanediol (FARXIGA) 5 MG TABS tablet    Sig: Take 5 mg by mouth daily.    Dispense:  90 tablet    Refill:  3    Order Specific Question:   Supervising Provider    Answer:   Clelia CroftSHAW, EVA N [[id number]]

## 2016-11-20 NOTE — Telephone Encounter (Signed)
My cell [phone].  Call to talk

## 2016-11-20 NOTE — Telephone Encounter (Signed)
Letter requesting temporary excuse from jury duty written and sent to patient in My Chart, per her request.  Dr. Ophelia CharterYates' note 11/09/2016 reviewed. There is no mention of the addition of Lantus or any other diabetes treatment. I will reach out to him.

## 2016-11-20 NOTE — Telephone Encounter (Signed)
Pt stating that she need an excuse letter to the court stating that she cant be present in court that's 3 hours away she has to be in court Monday you can send it to her through MyChart and she also states that Dr Ophelia CharterYates wants pt to be on Lantus 5mg 

## 2016-11-22 ENCOUNTER — Encounter: Payer: Self-pay | Admitting: Physician Assistant

## 2016-11-23 NOTE — Telephone Encounter (Signed)
Farxiga 5mg  approved effective 10/24/2016-11/23/2017 [facility] notified the pt.

## 2016-11-23 NOTE — Telephone Encounter (Signed)
PA [facility]ed to [facility]

## 2016-11-27 ENCOUNTER — Telehealth: Payer: Self-pay

## 2016-11-27 NOTE — Telephone Encounter (Signed)
Physical therapy called to update our office on pt referral pt has not been able to make appt due to her not feeling well and will schedule when she feels up to it

## 2016-12-01 MED ORDER — ESOMEPRAZOLE MAGNESIUM 40 MG PO CPDR: 40.0000 mg | DELAYED_RELEASE_CAPSULE | Freq: Every day | ORAL | 0 refills | Status: DC

## 2016-12-01 NOTE — Telephone Encounter (Signed)
10/2016 last ov 

## 2016-12-11 ENCOUNTER — Ambulatory Visit (INDEPENDENT_AMBULATORY_CARE_PROVIDER_SITE_OTHER): Payer: BLUE CROSS/BLUE SHIELD | Admitting: Orthopaedic Surgery

## 2016-12-21 ENCOUNTER — Other Ambulatory Visit: Payer: Self-pay | Admitting: Physician Assistant

## 2016-12-21 DIAGNOSIS — F4323 Adjustment disorder with mixed anxiety and depressed mood: Secondary | ICD-10-CM

## 2016-12-21 DIAGNOSIS — E034 Atrophy of thyroid (acquired): Secondary | ICD-10-CM

## 2016-12-21 NOTE — Telephone Encounter (Signed)
10/2016 last reflls and abnormal tsh

## 2016-12-24 NOTE — Telephone Encounter (Signed)
TSH was elevated, but T4 was normal.  Needs follow-up in 01/2017.  Meds ordered this encounter  Medications  . levothyroxine (SYNTHROID, LEVOTHROID) 200 MCG tablet    Sig: TAKE 1 TABLET(200 MCG) BY MOUTH DAILY BEFORE BREAKFAST    Dispense:  30 tablet    Refill:  0  . ALPRAZolam (XANAX) 0.5 MG tablet    Sig: TAKE 1 TABLET BY MOUTH TWICE DAILY AS NEEDED FOR ANXIETY    Dispense:  30 tablet    Refill:  0

## 2016-12-25 NOTE — Telephone Encounter (Signed)
Called to [facility] 

## 2017-01-04 ENCOUNTER — Other Ambulatory Visit: Payer: Self-pay | Admitting: Physician Assistant

## 2017-01-05 NOTE — Telephone Encounter (Signed)
09/2016 last refill 

## 2017-01-19 ENCOUNTER — Other Ambulatory Visit: Payer: Self-pay | Admitting: Family Medicine

## 2017-01-19 ENCOUNTER — Other Ambulatory Visit: Payer: Self-pay | Admitting: Physician Assistant

## 2017-01-22 ENCOUNTER — Encounter: Payer: Self-pay | Admitting: Physician Assistant

## 2017-01-22 ENCOUNTER — Ambulatory Visit (INDEPENDENT_AMBULATORY_CARE_PROVIDER_SITE_OTHER): Payer: BLUE CROSS/BLUE SHIELD | Admitting: Physician Assistant

## 2017-01-22 VITALS — BP 127/74 | HR 94 | Temp 98.2°F | Resp 18 | Ht 59.75 in | Wt 159.0 lb

## 2017-01-22 DIAGNOSIS — R609 Edema, unspecified: Secondary | ICD-10-CM

## 2017-01-22 DIAGNOSIS — R7401 Elevation of levels of liver transaminase levels: Secondary | ICD-10-CM

## 2017-01-22 DIAGNOSIS — I10 Essential (primary) hypertension: Secondary | ICD-10-CM

## 2017-01-22 DIAGNOSIS — E039 Hypothyroidism, unspecified: Secondary | ICD-10-CM | POA: Diagnosis not present

## 2017-01-22 DIAGNOSIS — R740 Nonspecific elevation of levels of transaminase and lactic acid dehydrogenase [LDH]: Secondary | ICD-10-CM | POA: Diagnosis not present

## 2017-01-22 DIAGNOSIS — E1169 Type 2 diabetes mellitus with other specified complication: Secondary | ICD-10-CM

## 2017-01-22 DIAGNOSIS — E669 Obesity, unspecified: Secondary | ICD-10-CM | POA: Diagnosis not present

## 2017-01-22 DIAGNOSIS — E2839 Other primary ovarian failure: Secondary | ICD-10-CM | POA: Diagnosis not present

## 2017-01-22 LAB — POCT GLYCOSYLATED HEMOGLOBIN (HGB A1C): HEMOGLOBIN A1C: 8.1

## 2017-01-22 MED ORDER — DAPAGLIFLOZIN PROPANEDIOL 10 MG PO TABS
10.0000 mg | ORAL_TABLET | Freq: Every day | ORAL | 3 refills | Status: DC
Start: 2017-01-22 — End: 2018-01-28

## 2017-01-22 MED ORDER — ESTRADIOL 1 MG PO TABS: 1.0000 mg | ORAL_TABLET | Freq: Every day | ORAL | 0 refills | Status: DC

## 2017-01-22 NOTE — Progress Notes (Signed)
Patient ID: Jessica Knox, female    DOB: [birth year], 51 y.o.   MRN: [id number]  PCP: Harrison Mons, PA-C  Chief Complaint  Patient presents with  . Follow-up    Diabetes, cholesterol, and thyroid.    Subjective:   Presents for evaluation of hypothyroidism, non-traumatic rhabdomyolysis, transaminitis and compartment syndrome, as well as diabetes.  Continues to ambulate with a limp. Notes that it takes a lot of work to walk "normal," and that she can't do it very long. As such, she isn't doing much. She's bored and lonely. Doesn't like not being busy and capable. Has not rescheduled with PT. First appointment was cancelled due to inclement weather. Not doing any kind of home exercises. Still has pain.  Has quit smoking!  Remains very frustrated by her weight. "I've tried everything." Has given up some of her favorite things, but remains unwilling to make additional adjustments, feeling like she shouldn't have to sacrifice more. Starting Nutri-system.   A1C 10/2016 8.0% At that visit, CK had returned to normal, renal profile was normal, ALT and AST were returning to normal, but still minimally elevated (60 and 51, respectively). TSH had improved dramatically to 7.760, down from 52.086 in 11/207. Given the rhabdomyolysis, she is unwilling to consider metformin again, and while she was initially interested in insulin, is no longer. Tolerating glipizide and Farxiga.   Review of Systems As above. No CP, SOB, HA, dizziness, nausea, vomiting, diarrhea. No urinary urgency, frequency, burning. No rash.    Patient Active Problem List   Diagnosis Date Noted  . Cellulitis of right lower leg   . 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 Leg swelling   . Non-traumatic rhabdomyolysis   . Rash 09/21/2016  . Transaminitis 09/21/2016  . Edema 09/20/2016  . Hypothyroidism 07/30/2016  . Essential hypertension 07/30/2016  . Diabetes mellitus type 2 in obese  ([place]) 07/30/2016  . Adjustment disorder with mixed anxiety and depressed mood 07/30/2016  . History of colonic polyps 07/30/2016  . Smoker 01/26/2015  . BMI 28.0-28.9,adult 01/26/2015  . Stress incontinence 01/26/2015  . Adnexal pain 12/17/2014  . Abdominal pain 04/24/2013  . Pancreatitis 04/24/2013    Prior to Admission medications   Medication Sig Start Date End Date Taking? Authorizing Provider  ADVANCED FIBER COMPLEX PO Take 1 each by mouth 2 (two) times daily.    Yes Historical Provider, MD  ALPRAZolam Duanne Moron) 0.5 MG tablet TAKE 1 TABLET BY MOUTH TWICE DAILY AS NEEDED FOR ANXIETY 12/24/16  Yes Falecia Vannatter, PA-C  aspirin EC 81 MG tablet Take 81 mg by mouth daily.   Yes Historical Provider, MD  Biotin w/ Vitamins C & E (HAIR/SKIN/NAILS PO) Take 1 tablet by mouth daily.   Yes Historical Provider, MD  Blood Glucose Monitoring Suppl KIT Check blood sugar daily as needed 08/03/16  Yes Jayvian Escoe, PA-C  buPROPion (WELLBUTRIN SR) 150 MG 12 hr tablet Take 1 tablet (150 mg total) by mouth 2 (two) times daily. 08/07/16  Yes Kal Chait, PA-C  CARTIA XT 180 MG 24 hr capsule TAKE ONE CAPSULE BY MOUTH DAILY 01/22/17  Yes Jaynee Eagles, PA-C  cetirizine (ZYRTEC) 10 MG tablet Take 10 mg by mouth daily.   Yes Historical Provider, MD  dapagliflozin propanediol (FARXIGA) 5 MG TABS tablet Take 5 mg by mouth daily. 11/20/16  Yes Dudley Cohick, PA-C  esomeprazole (NEXIUM) 40 MG capsule Take 1 capsule (40 mg total) by mouth daily. 12/01/16  Yes Harrison Mons,  PA-C  estradiol (ESTRACE) 1 MG tablet TAKE 1 TABLET DAILY Patient taking differently: Take 1 mg by mouth daily 09/19/16  Yes Tannia Contino, PA-C  fluticasone (FLONASE) 50 MCG/ACT nasal spray Place 1 spray into both nostrils daily as needed for allergies or rhinitis.   Yes Historical Provider, MD  glipiZIDE (GLUCOTROL) 10 MG tablet Take 1 tablet (10 mg total) by mouth 2 (two) times daily before a meal. 10/31/16  Yes Makaylee Spielberg, PA-C  glucose  blood test strip Use as instructed 08/28/16  Yes Jasir Rother, PA-C  HYDROcodone-acetaminophen (NORCO) 5-325 MG tablet Take 1 tablet by mouth every 6 (six) hours as needed for moderate pain. 10/22/16  Yes Marybelle Killings, MD  Ipratropium-Albuterol (COMBIVENT) 20-100 MCG/ACT AERS respimat Inhale 1 puff into the lungs every 6 (six) hours. 08/28/16  Yes Kiva Norland, PA-C  Lancets (ONETOUCH ULTRASOFT) lancets Test blood sugar daily. 08/17/16  Yes Hosey Burmester, PA-C  levothyroxine (SYNTHROID, LEVOTHROID) 200 MCG tablet TAKE 1 TABLET(200 MCG) BY MOUTH DAILY BEFORE BREAKFAST 12/24/16  Yes Dayleen Beske, PA-C  meloxicam (MOBIC) 15 MG tablet TAKE 1 TABLET DAILY 01/07/17  Yes Suhaib Guzzo, PA-C  metoprolol succinate (TOPROL-XL) 50 MG 24 hr tablet Take 50 mg by mouth 2 (two) times daily. Take with or immediately following a meal.   Yes Historical Provider, MD  nitrofurantoin (MACRODANTIN) 50 MG capsule TAKE 1 CAPSULE AT BEDTIME Patient taking differently: TAKE 50 MG BY MOUTH AT BEDTIME 09/19/16  Yes Ciaira Natividad, PA-C  ondansetron (ZOFRAN-ODT) 8 MG disintegrating tablet Take 1 tablet (8 mg total) by mouth every 8 (eight) hours as needed for nausea. 07/19/16  Yes Wardell Honour, MD  PROAIR HFA [phone] Base) MCG/ACT inhaler USE 2 INHALATIONS EVERY 4 HOURS AS NEEDED FOR WHEEZING OR SHORTNESS OF BREATH (COUGH, SHORTNESS OF BREATH OR WHEEZING) 11/12/16  Yes Yasuko Lapage, PA-C  valsartan (DIOVAN) 160 MG tablet Take 1 tablet (160 mg total) by mouth daily. 11/06/16  Yes Harrison Mons, PA-C       Allergies  Allergen Reactions  . Atorvastatin Swelling and Other (See Comments)    Edema  . Ciprofloxacin Palpitations and Other (See Comments)    Can take in spaced, small doses  . Morphine And Related Itching       Objective:  Physical Exam  Constitutional: She is oriented to person, place, and time. She appears well-developed and well-nourished. She is active and cooperative. No distress.  BP 127/74    Pulse 94   Temp 98.2 F (36.8 C) (Oral)   Resp 18   Ht 4' 11.75" (1.518 m)   Wt 159 lb (72.1 kg)   SpO2 96%   BMI 31.31 kg/m   HENT:  Head: Normocephalic and atraumatic.  Right Ear: Hearing normal.  Left Ear: Hearing normal.  Eyes: Conjunctivae are normal. No scleral icterus.  Neck: Normal range of motion. Neck supple. No thyromegaly present.  Cardiovascular: Normal rate, regular rhythm and normal heart sounds.   Pulses:      Radial pulses are 2+ on the right side, and 2+ on the left side.  Pulmonary/Chest: Effort normal and breath sounds normal.  Musculoskeletal:       Right ankle: She exhibits decreased range of motion (mild) and swelling (mild). Tenderness (initally exquisitely tender to light touch, but when distracted, minimal pain on palpation).       Right lower leg: She exhibits tenderness (initially exquisitely tender, but improved significantly with distraction) and swelling. She exhibits no bony tenderness, no deformity  and no laceration.       Right foot: There is tenderness. There is no swelling, normal capillary refill and no deformity.  Lymphadenopathy:       Head (right side): No tonsillar, no preauricular, no posterior auricular and no occipital adenopathy present.       Head (left side): No tonsillar, no preauricular, no posterior auricular and no occipital adenopathy present.    She has no cervical adenopathy.       Right: No supraclavicular adenopathy present.       Left: No supraclavicular adenopathy present.  Neurological: She is alert and oriented to person, place, and time. No sensory deficit.  Skin: Skin is warm, dry and intact. No rash noted. No cyanosis or erythema. Nails show no clubbing.  Psychiatric: She has a normal mood and affect. Her speech is normal and behavior is normal.     Wt Readings from Last 3 Encounters:  01/22/17 159 lb (72.1 kg)  11/09/16 165 lb (74.8 kg)  10/23/16 165 lb (74.8 kg)     Results for orders placed or performed in  visit on 01/22/17  POCT glycosylated hemoglobin (Hb A1C)  Result Value Ref Range   Hemoglobin A1C 8.1       Assessment & Plan:   1. Diabetes mellitus type 2 in obese (HCC) Uncontrolled. Non-compliant with dietary recommendations. Not interested in referral to [facility]. Not interested in resuming metformin. Increase Farxiga to 10 mg daily.  - Comprehensive metabolic panel - Lipid panel - Microalbumin, urine - POCT glycosylated hemoglobin (Hb A1C) - dapagliflozin propanediol (FARXIGA) 10 MG TABS tablet; Take 10 mg by mouth daily.  Dispense: 90 tablet; Refill: 3  2. Essential hypertension Controlled. - CBC with Differential/Platelet - Comprehensive metabolic panel  3. Hypothyroidism, unspecified type Await labs. Adjust regimen as indicated by results. - TSH - T4, free  4. Edema, unspecified type Much improved, not resolved. - Comprehensive metabolic panel  5. Transaminitis Has been improving. Await labs. - Comprehensive metabolic panel  6. Estrogen deficiency Needs refill. - estradiol (ESTRACE) 1 MG tablet; Take 1 tablet (1 mg total) by mouth daily.  Dispense: 90 tablet; Refill: 0  Return in about 3 months (around 04/24/2017).    Fara Chute, PA-C Physician Assistant-Certified [facility]

## 2017-01-22 NOTE — Telephone Encounter (Signed)
BP Readings from Last 3 Encounters:  10/23/16 118/82  10/05/16 134/88  10/02/16 138/88   Last annual exam was 07/2016. Patient's last 3 BP are well controlled. Refilled medications for 6 months to make her annual exam. Office needed at that time.

## 2017-01-22 NOTE — Patient Instructions (Addendum)
Increase the Farxiga from 5 mg to 10 mg (use up what you have by taking 2 of the 5 mg tablets each day).  Start working on exercising the RIGHT foot/ankle/leg. Be intentional about it. SCHEDULE it into your day, so that you don't skip it simply because you get busy. If you get stuck, either because you are NOT doing it, or you stop seeing improvements, let's plan to try PT.  Check your blood sugar twice each week. Once should be fasting, the other time should be 2-3 hours after the largest meal of the day. Fasting should be <130. Post-prandial should be <180.    IF you received an x-ray today, you will receive an invoice from [facility]. Please contact [facility] at [phone] with questions or concerns regarding your invoice.   IF you received labwork today, you will receive an invoice from [facility]. Please contact [facility] at [phone] with questions or concerns regarding your invoice.   Our billing staff will not be able to assist you with questions regarding bills from these companies.  You will be contacted with the lab results as soon as they are available. The fastest way to get your results is to activate your My Chart account. Instructions are located on the last page of this paperwork. If you have not heard from us regarding the results in 2 weeks, please contact this office.

## 2017-01-22 NOTE — Progress Notes (Signed)
Patient ID: Jessica Knox, female    DOB: October 24, 1966, 51 y.o.   MRN: [id number]  PCP: Harrison Mons, PA-C  Chief Complaint  Patient presents with  . Follow-up    Diabetes, cholesterol, and thyroid.    Subjective:   Presents for evaluation of DM, Doesn't regularly check her sugars at home, only when she thinks it low. She does take her medication. Hypertension, doesn't regularly check blood pressure at home but is taking her medication. No lifestyle changes or modifications of note. Also compliant with her Levothyroxine, denies constipation or cold intolerance changes. Aslo denies polyuria, polydipsia, fatigue, SOB, or HA  Some blurry vision, light headedness, last eye exam was over a year ago. Usually the lightheadedness occurs with pain from her right foot being touched or bumped. Doing at home physical therapy. At times she can walk normal but other times she cannot with pain.  9/10 at the worst, 2/10 when the pain is controlled.   Review of Systems As written above.    Patient Active Problem List   Diagnosis Date Noted  . Cellulitis of right lower leg   . 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 Leg swelling   . Non-traumatic rhabdomyolysis   . Rash 09/21/2016  . Transaminitis 09/21/2016  . Edema 09/20/2016  . Hypothyroidism 07/30/2016  . Essential hypertension 07/30/2016  . Diabetes mellitus type 2 in obese ([place]) 07/30/2016  . Adjustment disorder with mixed anxiety and depressed mood 07/30/2016  . History of colonic polyps 07/30/2016  . Smoker 01/26/2015  . BMI 28.0-28.9,adult 01/26/2015  . Stress incontinence 01/26/2015  . Adnexal pain 12/17/2014  . Abdominal pain 04/24/2013  . Pancreatitis 04/24/2013    Prior to Admission medications   Medication Sig Start Date End Date Taking? Authorizing Provider  ADVANCED FIBER COMPLEX PO Take 1 each by mouth 2 (two) times daily.    Yes Historical Provider, MD  ALPRAZolam Duanne Moron) 0.5 MG tablet TAKE  1 TABLET BY MOUTH TWICE DAILY AS NEEDED FOR ANXIETY 12/24/16  Yes Chelle Jeffery, PA-C  aspirin EC 81 MG tablet Take 81 mg by mouth daily.   Yes Historical Provider, MD  Biotin w/ Vitamins C & E (HAIR/SKIN/NAILS PO) Take 1 tablet by mouth daily.   Yes Historical Provider, MD  Blood Glucose Monitoring Suppl KIT Check blood sugar daily as needed 08/03/16  Yes Chelle Jeffery, PA-C  buPROPion (WELLBUTRIN SR) 150 MG 12 hr tablet Take 1 tablet (150 mg total) by mouth 2 (two) times daily. 08/07/16  Yes Chelle Jeffery, PA-C  CARTIA XT 180 MG 24 hr capsule TAKE 1 CAPSULE DAILY Patient taking differently: Take 180 mg by mouth daily 09/19/16  Yes Chelle Jeffery, PA-C  CARTIA XT 180 MG 24 hr capsule TAKE ONE CAPSULE BY MOUTH DAILY 01/22/17  Yes Jaynee Eagles, PA-C  cetirizine (ZYRTEC) 10 MG tablet Take 10 mg by mouth daily.   Yes Historical Provider, MD  dapagliflozin propanediol (FARXIGA) 5 MG TABS tablet Take 5 mg by mouth daily. 11/20/16  Yes Chelle Jeffery, PA-C  esomeprazole (NEXIUM) 40 MG capsule Take 1 capsule (40 mg total) by mouth daily. 12/01/16  Yes Chelle Jeffery, PA-C  estradiol (ESTRACE) 1 MG tablet TAKE 1 TABLET DAILY Patient taking differently: Take 1 mg by mouth daily 09/19/16  Yes Chelle Jeffery, PA-C  fluticasone (FLONASE) 50 MCG/ACT nasal spray Place 1 spray into both nostrils daily as needed for allergies or rhinitis.   Yes Historical Provider, MD  glipiZIDE (GLUCOTROL) 10  MG tablet Take 1 tablet (10 mg total) by mouth 2 (two) times daily before a meal. 10/31/16  Yes Chelle Jeffery, PA-C  glucose blood test strip Use as instructed 08/28/16  Yes Chelle Jeffery, PA-C  HYDROcodone-acetaminophen (NORCO) 5-325 MG tablet Take 1 tablet by mouth every 6 (six) hours as needed for moderate pain. 10/22/16  Yes Marybelle Killings, MD  Ipratropium-Albuterol (COMBIVENT) 20-100 MCG/ACT AERS respimat Inhale 1 puff into the lungs every 6 (six) hours. 08/28/16  Yes Chelle Jeffery, PA-C  Lancets (ONETOUCH ULTRASOFT)  lancets Test blood sugar daily. 08/17/16  Yes Chelle Jeffery, PA-C  levothyroxine (SYNTHROID, LEVOTHROID) 200 MCG tablet TAKE 1 TABLET(200 MCG) BY MOUTH DAILY BEFORE BREAKFAST 12/24/16  Yes Chelle Jeffery, PA-C  meloxicam (MOBIC) 15 MG tablet Take 1 tablet (15 mg total) by mouth daily as needed for pain. 09/27/16  Yes Modena Jansky, MD  meloxicam (MOBIC) 15 MG tablet TAKE 1 TABLET DAILY 01/07/17  Yes Chelle Jeffery, PA-C  metoprolol succinate (TOPROL-XL) 50 MG 24 hr tablet Take 50 mg by mouth 2 (two) times daily. Take with or immediately following a meal.   Yes Historical Provider, MD  nitrofurantoin (MACRODANTIN) 50 MG capsule TAKE 1 CAPSULE AT BEDTIME Patient taking differently: TAKE 50 MG BY MOUTH AT BEDTIME 09/19/16  Yes Chelle Jeffery, PA-C  ondansetron (ZOFRAN-ODT) 8 MG disintegrating tablet Take 1 tablet (8 mg total) by mouth every 8 (eight) hours as needed for nausea. 07/19/16  Yes Wardell Honour, MD  PROAIR HFA [phone] Base) MCG/ACT inhaler USE 2 INHALATIONS EVERY 4 HOURS AS NEEDED FOR WHEEZING OR SHORTNESS OF BREATH (COUGH, SHORTNESS OF BREATH OR WHEEZING) 11/12/16  Yes Chelle Jeffery, PA-C  valsartan (DIOVAN) 160 MG tablet Take 1 tablet (160 mg total) by mouth daily. 11/06/16  Yes Harrison Mons, PA-C     Allergies  Allergen Reactions  . Atorvastatin Swelling and Other (See Comments)    Edema  . Ciprofloxacin Palpitations and Other (See Comments)    Can take in spaced, small doses  . Morphine And Related Itching       Objective:  Physical Exam  Constitutional: She is oriented to person, place, and time. She appears well-developed and well-nourished. She is active.  Blood pressure 127/74, pulse 94, temperature 98.2 F (36.8 C), temperature source Oral, resp. rate 18, height 4' 11.75" (1.518 m), weight 159 lb (72.1 kg), SpO2 96 %.  HENT:  Head: Normocephalic.  Eyes: Pupils are equal, round, and reactive to light.  Neck: Normal range of motion.  Cardiovascular: Normal rate, regular  rhythm and normal heart sounds.   Pulmonary/Chest: Effort normal and breath sounds normal.  Musculoskeletal:       Legs:      Feet:  Neurological: She is alert and oriented to person, place, and time.  Skin: Skin is warm and dry.  Psychiatric: She has a normal mood and affect. Her speech is normal and behavior is normal. Judgment and thought content normal. Cognition and memory are normal.      Assessment & Plan:  1. Diabetes mellitus type 2 in obese (HCC) H A1C has increased since last visit despite lifestyle changes.more drastic measures should be taken in regards to lifestyle modification or additional medication should be added. - Comprehensive metabolic panel - Lipid panel - Microalbumin, urine - POCT glycosylated hemoglobin (Hb A1C) - dapagliflozin propanediol (FARXIGA) 10 MG TABS tablet; Take 10 mg by mouth daily.  Dispense: 90 tablet; Refill: 3  2. Essential hypertension - CBC with Differential/Platelet -  Comprehensive metabolic panel  3. Hypothyroidism, unspecified type - TSH - T4, free  4. Edema, unspecified type - Comprehensive metabolic panel  5. Transaminitis - Comprehensive metabolic panel  6. Estrogen deficiency - estradiol (ESTRACE) 1 MG tablet; Take 1 tablet (1 mg total) by mouth daily.  Dispense: 90 tablet; Refill: 0

## 2017-01-23 ENCOUNTER — Encounter: Payer: Self-pay | Admitting: Physician Assistant

## 2017-01-23 LAB — CBC WITH DIFFERENTIAL/PLATELET
BASOS ABS: 0 10*3/uL (ref 0.0–0.2)
Basos: 0 %
EOS (ABSOLUTE): 0.2 10*3/uL (ref 0.0–0.4)
EOS: 2 %
HEMATOCRIT: 46.4 % (ref 34.0–46.6)
HEMOGLOBIN: 15.6 g/dL (ref 11.1–15.9)
IMMATURE GRANS (ABS): 0 10*3/uL (ref 0.0–0.1)
IMMATURE GRANULOCYTES: 0 %
LYMPHS: 26 %
Lymphocytes Absolute: 2.6 10*3/uL (ref 0.7–3.1)
MCH: 29.9 pg (ref 26.6–33.0)
MCHC: 33.6 g/dL (ref 31.5–35.7)
MCV: 89 fL (ref 79–97)
MONOCYTES: 8 %
Monocytes Absolute: 0.8 10*3/uL (ref 0.1–0.9)
NEUTROS PCT: 64 %
Neutrophils Absolute: 6.2 10*3/uL (ref 1.4–7.0)
Platelets: 315 10*3/uL (ref 150–379)
RBC: 5.21 x10E6/uL (ref 3.77–5.28)
RDW: 12.9 % (ref 12.3–15.4)
WBC: 9.9 10*3/uL (ref 3.4–10.8)

## 2017-01-23 LAB — LIPID PANEL
CHOL/HDL RATIO: 5.9 ratio — AB (ref 0.0–4.4)
CHOLESTEROL TOTAL: 249 mg/dL — AB (ref 100–199)
HDL: 42 mg/dL (ref >39)
LDL CALC: 143 mg/dL — AB (ref 0–99)
TRIGLYCERIDES: 322 mg/dL — AB (ref 0–149)
VLDL CHOLESTEROL CAL: 64 mg/dL — AB (ref 5–40)

## 2017-01-23 LAB — COMPREHENSIVE METABOLIC PANEL
ALBUMIN: 4.7 g/dL (ref 3.5–5.5)
ALT: 56 IU/L — ABNORMAL HIGH (ref 0–32)
AST: 48 IU/L — AB (ref 0–40)
Albumin/Globulin Ratio: 1.5 (ref 1.2–2.2)
Alkaline Phosphatase: 99 IU/L (ref 39–117)
BUN / CREAT RATIO: 37 — AB (ref 9–23)
BUN: 22 mg/dL (ref 6–24)
Bilirubin Total: 0.3 mg/dL (ref 0.0–1.2)
CALCIUM: 9.8 mg/dL (ref 8.7–10.2)
CO2: 20 mmol/L (ref 18–29)
CREATININE: 0.6 mg/dL (ref 0.57–1.00)
Chloride: 100 mmol/L (ref 96–106)
GFR calc Af Amer: 125 mL/min/{1.73_m2} (ref >59)
GFR, EST NON AFRICAN AMERICAN: 108 mL/min/{1.73_m2} (ref >59)
GLOBULIN, TOTAL: 3.1 g/dL (ref 1.5–4.5)
GLUCOSE: 117 mg/dL — AB (ref 65–99)
Potassium: 4.8 mmol/L (ref 3.5–5.2)
Sodium: 140 mmol/L (ref 134–144)
Total Protein: 7.8 g/dL (ref 6.0–8.5)

## 2017-01-23 LAB — TSH: TSH: 0.188 u[IU]/mL — ABNORMAL LOW (ref 0.450–4.500)

## 2017-01-23 LAB — T4, FREE: Free T4: 1.89 ng/dL — ABNORMAL HIGH (ref 0.82–1.77)

## 2017-01-23 LAB — MICROALBUMIN, URINE: Microalbumin, Urine: 4.1 ug/mL

## 2017-01-23 MED ORDER — LEVOTHYROXINE SODIUM 175 MCG PO TABS: 175.0000 ug | ORAL_TABLET | Freq: Every day | ORAL | 1 refills | Status: DC

## 2017-01-25 ENCOUNTER — Other Ambulatory Visit: Payer: Self-pay | Admitting: Physician Assistant

## 2017-02-19 ENCOUNTER — Other Ambulatory Visit: Payer: Self-pay | Admitting: Physician Assistant

## 2017-02-19 DIAGNOSIS — F4323 Adjustment disorder with mixed anxiety and depressed mood: Secondary | ICD-10-CM

## 2017-02-19 NOTE — Telephone Encounter (Signed)
Meds ordered this encounter  Medications  . ALPRAZolam (XANAX) 0.5 MG tablet    Sig: TAKE 1 TABLET BY MOUTH TWICE DAILY AS NEEDED FOR ANXIETY    Dispense:  30 tablet    Refill:  0

## 2017-02-20 NOTE — Telephone Encounter (Signed)
Left message script faxed to [facility] pharmacy

## 2017-02-27 ENCOUNTER — Telehealth (INDEPENDENT_AMBULATORY_CARE_PROVIDER_SITE_OTHER): Payer: Self-pay | Admitting: Orthopaedic Surgery

## 2017-02-27 NOTE — Telephone Encounter (Signed)
Can you please put patient in on a Wednesday morning so that Fayrene Fearing will be in clinic with Dr. Ophelia Charter? It would be best after 9:30am since he makes rounds. Thanks.

## 2017-02-27 NOTE — Telephone Encounter (Signed)
error 

## 2017-02-27 NOTE — Telephone Encounter (Signed)
Patient called wanting to make a follow up appointment with Zonia Kief. I can't access his schedule, and she would rather see Fayrene Fearing than Dr. Ophelia Charter. CB # [phone]

## 2017-03-04 ENCOUNTER — Other Ambulatory Visit: Payer: Self-pay | Admitting: Physician Assistant

## 2017-03-06 ENCOUNTER — Telehealth (INDEPENDENT_AMBULATORY_CARE_PROVIDER_SITE_OTHER): Payer: Self-pay

## 2017-03-06 ENCOUNTER — Other Ambulatory Visit: Payer: Self-pay | Admitting: Physician Assistant

## 2017-03-06 NOTE — Telephone Encounter (Signed)
Patient called wanting to make a follow up appointment with Zonia Kief. She would rather see Fayrene Fearing than Dr. Ophelia Charter. I did see your message to [name] to schedule her on a Wednesday after 9:30am, but I didn't see any openings. CB # [phone]

## 2017-03-07 NOTE — Telephone Encounter (Signed)
Jessica HuskyCassandra has prior message about putting patient on schedule on a Wednesday morning when Jessica FearingJames will be in the office.

## 2017-03-08 ENCOUNTER — Encounter: Payer: Self-pay | Admitting: Physician Assistant

## 2017-03-11 MED ORDER — NALTREXONE-BUPROPION HCL ER 8-90 MG PO TB12: 2.0000 | ORAL_TABLET | Freq: Two times a day (BID) | ORAL | 2 refills | Status: DC

## 2017-03-11 NOTE — Telephone Encounter (Signed)
WED 5/09 @ 1PM WITH JAMES

## 2017-03-12 ENCOUNTER — Telehealth: Payer: Self-pay

## 2017-03-12 NOTE — Telephone Encounter (Signed)
I do not prescribe the alternatives listed. Patient notified by My Chart.

## 2017-03-12 NOTE — Telephone Encounter (Signed)
contrave  Not covered, preferred benzphetamine, diethylpropion hcl, phentermine hcl

## 2017-03-13 ENCOUNTER — Ambulatory Visit (INDEPENDENT_AMBULATORY_CARE_PROVIDER_SITE_OTHER): Payer: BLUE CROSS/BLUE SHIELD | Admitting: Surgery

## 2017-03-13 ENCOUNTER — Ambulatory Visit (INDEPENDENT_AMBULATORY_CARE_PROVIDER_SITE_OTHER): Payer: BLUE CROSS/BLUE SHIELD

## 2017-03-13 ENCOUNTER — Encounter (INDEPENDENT_AMBULATORY_CARE_PROVIDER_SITE_OTHER): Payer: Self-pay | Admitting: Surgery

## 2017-03-13 DIAGNOSIS — M5416 Radiculopathy, lumbar region: Secondary | ICD-10-CM | POA: Diagnosis not present

## 2017-03-13 DIAGNOSIS — M545 Low back pain: Secondary | ICD-10-CM

## 2017-03-13 DIAGNOSIS — M25571 Pain in right ankle and joints of right foot: Secondary | ICD-10-CM | POA: Diagnosis not present

## 2017-03-13 DIAGNOSIS — M722 Plantar fascial fibromatosis: Secondary | ICD-10-CM | POA: Diagnosis not present

## 2017-03-13 LAB — CBC WITH DIFFERENTIAL/PLATELET
BASOS ABS: 0 {cells}/uL (ref 0–200)
Basophils Relative: 0 %
EOS ABS: 194 {cells}/uL (ref 15–500)
Eosinophils Relative: 2 %
HEMATOCRIT: 45.8 % — AB (ref 35.0–45.0)
HEMOGLOBIN: 15.4 g/dL (ref 11.7–15.5)
LYMPHS ABS: 2328 {cells}/uL (ref 850–3900)
Lymphocytes Relative: 24 %
MCH: 29.8 pg (ref 27.0–33.0)
MCHC: 33.6 g/dL (ref 32.0–36.0)
MCV: 88.8 fL (ref 80.0–100.0)
MONO ABS: 776 {cells}/uL (ref 200–950)
MPV: 9.5 fL (ref 7.5–12.5)
Monocytes Relative: 8 %
NEUTROS PCT: 66 %
Neutro Abs: 6402 cells/uL (ref 1500–7800)
Platelets: 290 10*3/uL (ref 140–400)
RBC: 5.16 MIL/uL — ABNORMAL HIGH (ref 3.80–5.10)
RDW: 13.5 % (ref 11.0–15.0)
WBC: 9.7 10*3/uL (ref 3.8–10.8)

## 2017-03-13 MED ORDER — TRAMADOL HCL 50 MG PO TABS: 50.0000 mg | ORAL_TABLET | Freq: Four times a day (QID) | ORAL | 0 refills | Status: DC | PRN

## 2017-03-13 MED ORDER — KETOROLAC TROMETHAMINE 30 MG/ML IM SOLN: 30.0000 mg | Freq: Once | INTRAMUSCULAR | Status: AC

## 2017-03-14 LAB — SEDIMENTATION RATE: Sed Rate: 11 mm/hr (ref 0–20)

## 2017-03-14 LAB — URIC ACID: Uric Acid, Serum: 3.2 mg/dL (ref 2.5–7.0)

## 2017-03-14 LAB — HIGH SENSITIVITY CRP: CRP, High Sensitivity: 23.5 mg/L — ABNORMAL HIGH

## 2017-03-18 ENCOUNTER — Telehealth (INDEPENDENT_AMBULATORY_CARE_PROVIDER_SITE_OTHER): Payer: Self-pay | Admitting: Radiology

## 2017-03-18 NOTE — Telephone Encounter (Signed)
Patient requests call regarding her lab results. CB [phone]

## 2017-03-19 ENCOUNTER — Encounter (INDEPENDENT_AMBULATORY_CARE_PROVIDER_SITE_OTHER): Payer: Self-pay | Admitting: Surgery

## 2017-03-20 ENCOUNTER — Telehealth (INDEPENDENT_AMBULATORY_CARE_PROVIDER_SITE_OTHER): Payer: Self-pay | Admitting: Orthopaedic Surgery

## 2017-03-20 ENCOUNTER — Other Ambulatory Visit: Payer: Self-pay | Admitting: Physician Assistant

## 2017-03-20 ENCOUNTER — Telehealth: Payer: Self-pay | Admitting: Physician Assistant

## 2017-03-20 ENCOUNTER — Encounter (INDEPENDENT_AMBULATORY_CARE_PROVIDER_SITE_OTHER): Payer: Self-pay | Admitting: Surgery

## 2017-03-20 NOTE — Telephone Encounter (Signed)
Patient called asked for a call back as soon as possible because she is still in so much pain. Patient stated she need to really talk to Jessica Knox or Jessica Knox. Patient asked when can she get her MRI scheduled. Patient is concerned about her right ankle. The number to contact patient is [phone]

## 2017-03-20 NOTE — Telephone Encounter (Signed)
Can you let me know the status of MRI and I will call to speak with patient.  Thanks.

## 2017-03-20 NOTE — Telephone Encounter (Signed)
I just s/w Fab[facility] at [facility] and she is going to call pt today to schedule appt with pt.

## 2017-03-20 NOTE — Telephone Encounter (Signed)
I called and spoke with patient. She is having a very difficult time with being immobile and states that her leg hurts all of the time. She received her lab work when [facility] released it and saw that she does not have gout. She knows that a MRI is being ordered, but wants to know what she can do about her foot and leg. She is not requesting narcotics. She states that the tramadol and meloxicam do not work at all. She is having to stay in the bed or on the couch due to pain.   I advised that [facility] will be calling her today to set up appt for MRI. I did explain to patient that we may have to wait for MRI results prior to being able to do anything further. I also explained that her MRI would be reviewed with her by Dr. Ophelia CharterYates and not Fayrene FearingJames when she came back into the office.   She would like a return call regarding what she can do in the meantime. Please advise.

## 2017-03-20 NOTE — Telephone Encounter (Addendum)
THIS MESSAGE IS FROM SARA AT [facility]. THEY JUST RECEIVED A FAX FROM [facility]HELLE REQUESTING THAT PATIENT HAVE THE GENERIC VERSION OF CONTRAVE. 8 PER 90 MG ER TABS. SARA SAID IT CAN NOT BE BROKEN UP LIKE THAT BECAUSE THE INGREDIENTS DOES NOT COME IN THAT STRENGTH. WHAT SHOULD THEY DO? BEST PHONE [phone] ([facility] IN [place] - SARA IS THE PHARMACY TECH) MBC

## 2017-03-21 ENCOUNTER — Ambulatory Visit
Admission: RE | Admit: 2017-03-21 | Discharge: 2017-03-21 | Disposition: A | Discharge status: Home / Self Care | Payer: BLUE CROSS/BLUE SHIELD | Source: Ambulatory Visit | Attending: Surgery | Admitting: Surgery

## 2017-03-21 DIAGNOSIS — M5416 Radiculopathy, lumbar region: Secondary | ICD-10-CM

## 2017-03-21 MED ORDER — BUPROPION HCL ER (SR) 100 MG PO TB12: 200.0000 mg | ORAL_TABLET | Freq: Two times a day (BID) | ORAL | 2 refills | Status: DC

## 2017-03-21 MED ORDER — NALTREXONE HCL 50 MG PO TABS: ORAL_TABLET | ORAL | 2 refills | Status: DC

## 2017-03-21 NOTE — Telephone Encounter (Signed)
contrave generic is naltrexone  50mg  / bupropion 75 or 100mg  Pt did pay cash for contrave this time

## 2017-03-21 NOTE — Telephone Encounter (Signed)
On te bottom of te fax, I wrote out the orders for the two products. That said, I did not know that the naltrexone didn't come in the 8 mg, so that one won't work, but I DID write the bupropion for 100 mg.  So, Meds ordered this encounter  Medications  . naltrexone (DEPADE) 50 MG tablet    Sig: 25 mg PO QAM x 1 week, then 25 mg PO BID x 1 week, then 50 mg PO QAM and 25 mg PO QPM x 1 week, then 50 mg PO BID    Dispense:  60 tablet    Refill:  2    Order Specific Question:   Supervising Provider    Answer:   SHAW, EVA N [[id number]]  . buPROPion (WELLBUTRIN SR) 100 MG 12 hr tablet    Sig: Take 2 tablets (200 mg total) by mouth 2 (two) times daily.    Dispense:  60 tablet    Refill:  2    Order Specific Question:   Supervising Provider    Answer:   Clelia CroftSHAW, EVA N [[id number]]

## 2017-03-27 ENCOUNTER — Telehealth (INDEPENDENT_AMBULATORY_CARE_PROVIDER_SITE_OTHER): Payer: Self-pay

## 2017-03-27 NOTE — Telephone Encounter (Signed)
Advised patient that there is not much we can do at this point until she has the MRI scan

## 2017-03-27 NOTE — Telephone Encounter (Signed)
I left voicemail for patient advising Jessica Knox wanted her to return to see Dr. Ophelia CharterYates for results. She has appt scheduled already. Asked for her to return call and leave me a message if she is not going to be able to make this appt.

## 2017-03-27 NOTE — Telephone Encounter (Signed)
Patient would like a call back concerning her MRI results.  CB# is [phone]. Thank You

## 2017-04-03 ENCOUNTER — Ambulatory Visit (INDEPENDENT_AMBULATORY_CARE_PROVIDER_SITE_OTHER): Payer: BLUE CROSS/BLUE SHIELD | Admitting: Orthopaedic Surgery

## 2017-04-03 ENCOUNTER — Encounter (INDEPENDENT_AMBULATORY_CARE_PROVIDER_SITE_OTHER): Payer: Self-pay | Admitting: Orthopaedic Surgery

## 2017-04-03 VITALS — BP 122/77 | HR 77 | Ht 59.75 in | Wt 165.0 lb

## 2017-04-03 DIAGNOSIS — M79601 Pain in right arm: Secondary | ICD-10-CM | POA: Diagnosis not present

## 2017-04-03 DIAGNOSIS — R200 Anesthesia of skin: Secondary | ICD-10-CM | POA: Diagnosis not present

## 2017-04-03 DIAGNOSIS — T79A21S Traumatic compartment syndrome of right lower extremity, sequela: Secondary | ICD-10-CM

## 2017-04-03 NOTE — Progress Notes (Addendum)
Office Visit Note   Patient: Jessica Knox           Date of Birth: 1965/12/13           MRN: [id number] Visit Date: 04/03/2017              Requested by: Porfirio OarJeffery, Chelle, PA-C [address] [place], [place] [id number] PCP: Porfirio OarJeffery, Chelle, PA-C   Assessment & Plan: Visit Diagnoses:  1. Right arm pain   2. Numbness of right hand   3. 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 Office follow up after tests. Sedimentation rate was normal uric acid was normal. We reviewed her lumbar MRI scan which did not show significant compression. Some lateral recess narrowing on the left L5-S1 opposite side from her symptoms in her calf. Currently her right hand is giving her considerable problems with activities of daily living and sleeping at night. Follow-up after right nerve conduction velocities to rule out carpal tunnel syndrome.Her uric acid was 3.2 and CRP was elevated 23.5 with normal white count 9700. Sedimentation rate was 11.  Follow-Up Instructions: Follow-up after nerve conduction velocities.  Orders:  Orders Placed This Encounter  Procedures  . Ambulatory referral to Physical Medicine Rehab   No orders of the defined types were placed in this encounter.     Procedures: No procedures performed   Clinical Data: No additional findings.   Subjective: Chief Complaint  Patient presents with  . Lower Back - Pain    HPI patient returns and continues to have some problems with her right lower extremity where she had rhabdomyolysis with compartment syndrome. She's gotten improvement in her ankle dorsiflexion and gait. This is improved in the last 2 months. She continues to have significant pain in her right hand with numbness in her hand she shakes her hand that bothers her at night she drops objects. She still has fatigue in the right leg with  prolonged walking.   Review of Systems positive for diabetes A1c December 2017 was 8.0. Positive for history of rhabdomyolysis possibly related to a statin. History of hypothyroidism, history of pancreatitis.   Objective: Vital Signs: BP 122/77   Pulse 77   Ht 4' 11.75" (1.518 m)   Wt 165 lb (74.8 kg)   BMI 32.49 kg/m   Physical Exam  Constitutional: She is oriented to person, place, and time. She appears well-developed.  HENT:  Head: Normocephalic.  Right Ear: External ear normal.  Left Ear: External ear normal.  Eyes: Pupils are equal, round, and reactive to light.  Neck: No tracheal deviation present. No thyromegaly present.  Cardiovascular: Normal rate.   Pulmonary/Chest: Effort normal.  Abdominal: Soft.  Musculoskeletal:  Compartments of the right leg are symmetrical to the left anterior and lateral compartment. She has slight weakness of toe extension EHL anterior tib is significantly improved and takes May no resistance. She's not quite symmetrical. Heel toe gait improved. Negative for foot drop gait. Positive pain with right carpal compression positive Phalen's on the right negative on the left. Good shoulder range of motion no reaches full extension. No thenar atrophy. With Phalen's-she has not numbness in the median distribution of the hand.  Neurological: She is alert and oriented to person, place, and time.  Skin: Skin is warm and dry.  Psychiatric: She has a normal mood and affect. Her behavior is normal.  Ortho Exam  Specialty Comments:  No specialty comments available.  Imaging: contrast  Study Result   CLINICAL DATA:  Chronic low back pain with right lower extremity radiculopathy  EXAM: MRI LUMBAR SPINE WITHOUT CONTRAST  TECHNIQUE: Multiplanar, multisequence MR imaging of the lumbar spine was performed. No intravenous contrast was administered.  COMPARISON:  Lumbar spine radiograph 03/13/2017  FINDINGS: Segmentation: There is transitional  lumbosacral anatomy with a partially sacralized L5 vertebral body with bilateral assimilation joints. The lowest disc space is considered L5-S1. There are hypoplastic ribs at T12.  Alignment:  Normal  Vertebrae: T11 hemangioma. No acute compression fracture, discitis-osteomyelitis, facet edema or other focal marrow lesion. No epidural collection.  Conus medullaris: Extends to the L1 level and appears normal.  Paraspinal and other soft tissues: The visualized aorta, IVC and iliac vessels are normal. The visualized retroperitoneal organs and paraspinal soft tissues are normal.  Disc levels:  T12-L1: Normal disc space and facets. No spinal canal or neuroforaminal stenosis.  L1-L2: Normal disc space and facets. No spinal canal or neuroforaminal stenosis.  L2-L3: Normal disc space and facets. No spinal canal or neuroforaminal stenosis.  L3-L4: Normal disc space and facets. No spinal canal or neuroforaminal stenosis.  L4-L5: Small central disc protrusion with annular fissure. No associated stenosis. Mild facet hypertrophy.  L5-S1: Small central disc extrusion with inferior migration narrowing the left lateral recess. No spinal canal stenosis. No neural foraminal stenosis. Mild facet hypertrophy.  Visualized sacrum: Normal.  IMPRESSION: 1. Left lateral recess narrowing at L5-S1 secondary to small central disc extrusion. Correlate for left S1 radiculopathy. 2. Central disc protrusion with annular fissure at L4-L5, but no associated stenosis. 3. Transitional lumbosacral anatomy. Lowest disc space is considered to be L5-S1.   Electronically Signed   By: Deatra Robinson M.D.   On: 03/21/2017 22:50        PMFS History: Patient Active Problem List   Diagnosis Date Noted  . Numbness of right hand 04/04/2017  . Estrogen deficiency 01/22/2017  . 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 Leg swelling   . Non-traumatic  rhabdomyolysis   . Rash 09/21/2016  . Transaminitis 09/21/2016  . Hypothyroidism 07/30/2016  . Essential hypertension 07/30/2016  . Diabetes mellitus type 2 in obese (HCC) 07/30/2016  . Adjustment disorder with mixed anxiety and depressed mood 07/30/2016  . History of colonic polyps 07/30/2016  . Former smoker 01/26/2015  . BMI 28.0-28.9,adult 01/26/2015  . Stress incontinence 01/26/2015  . Adnexal pain 12/17/2014  . Abdominal pain 04/24/2013  . Pancreatitis 04/24/2013   Past Medical History:  Diagnosis Date  . Diabetes mellitus without complication (HCC)   . GERD (gastroesophageal reflux disease)   . Hypertension   . SVT (supraventricular tachycardia) (HCC)   . Thyroid disease    multinodular thyroid total thyroidectomy    Family History  Problem Relation Age of Onset  . Adopted: Yes  . Family history unknown: Yes    Past Surgical History:  Procedure Laterality Date  . ABDOMINAL HYSTERECTOMY    . CHOLECYSTECTOMY    . RIGHT OOPHORECTOMY  12/06/2012  . RIGHT OOPHORECTOMY Right   . ROBOTIC ASSISTED DIAGNOSTIC LAPAROSCOPY  01/10/2015  . THYROIDECTOMY     Social History   Occupational History  . Not on file.   Social History Main Topics  . Smoking status: Former Smoker    Packs/day: 1.00    Years: 30.00    Types: Cigarettes    Quit date: 08/02/2016  . Smokeless  tobacco: Never Used  . Alcohol use 0.0 oz/week  . Drug use: No  . Sexual activity: Not on file

## 2017-04-04 DIAGNOSIS — R200 Anesthesia of skin: Secondary | ICD-10-CM | POA: Insufficient documentation

## 2017-04-09 ENCOUNTER — Other Ambulatory Visit (INDEPENDENT_AMBULATORY_CARE_PROVIDER_SITE_OTHER): Payer: Self-pay | Admitting: Surgery

## 2017-04-09 ENCOUNTER — Encounter (INDEPENDENT_AMBULATORY_CARE_PROVIDER_SITE_OTHER): Payer: Self-pay | Admitting: Surgery

## 2017-04-09 ENCOUNTER — Encounter: Payer: Self-pay | Admitting: Physician Assistant

## 2017-04-10 ENCOUNTER — Other Ambulatory Visit (INDEPENDENT_AMBULATORY_CARE_PROVIDER_SITE_OTHER): Payer: Self-pay | Admitting: Radiology

## 2017-04-10 ENCOUNTER — Other Ambulatory Visit (INDEPENDENT_AMBULATORY_CARE_PROVIDER_SITE_OTHER): Payer: Self-pay | Admitting: Orthopaedic Surgery

## 2017-04-10 MED ORDER — TRAMADOL HCL 50 MG PO TABS: ORAL_TABLET | ORAL | 0 refills | Status: DC

## 2017-04-10 NOTE — Telephone Encounter (Signed)
Per another message from Dr Ophelia CharterYates, ok to fill as before. #[phone].

## 2017-04-17 ENCOUNTER — Other Ambulatory Visit: Payer: Self-pay | Admitting: Family Medicine

## 2017-04-17 DIAGNOSIS — E034 Atrophy of thyroid (acquired): Secondary | ICD-10-CM

## 2017-04-18 ENCOUNTER — Other Ambulatory Visit: Payer: Self-pay

## 2017-04-18 ENCOUNTER — Encounter: Payer: Self-pay | Admitting: Physician Assistant

## 2017-04-18 MED ORDER — LEVOTHYROXINE SODIUM 175 MCG PO TABS: 175.0000 ug | ORAL_TABLET | Freq: Every day | ORAL | 1 refills | Status: DC

## 2017-04-18 NOTE — Telephone Encounter (Signed)
Pt states [facility] trying to get synthroid refilled.  Looks like it was sent to [facility]...  Refilled.

## 2017-04-19 NOTE — Telephone Encounter (Signed)
In March, we reduced the dose from 200 mcg to 175 mcg. She was to [facility] in 6 weeks for repeat labs. She has not returned.

## 2017-04-23 ENCOUNTER — Encounter: Payer: Self-pay | Admitting: Physician Assistant

## 2017-04-23 ENCOUNTER — Encounter (INDEPENDENT_AMBULATORY_CARE_PROVIDER_SITE_OTHER): Payer: Self-pay | Admitting: Physical Medicine and Rehabilitation

## 2017-04-23 ENCOUNTER — Ambulatory Visit (INDEPENDENT_AMBULATORY_CARE_PROVIDER_SITE_OTHER): Payer: BLUE CROSS/BLUE SHIELD | Admitting: Physician Assistant

## 2017-04-23 ENCOUNTER — Ambulatory Visit (INDEPENDENT_AMBULATORY_CARE_PROVIDER_SITE_OTHER): Payer: BLUE CROSS/BLUE SHIELD | Admitting: Physical Medicine and Rehabilitation

## 2017-04-23 VITALS — BP 126/80 | HR 88 | Temp 97.6°F | Resp 18 | Ht 59.75 in | Wt 170.3 lb

## 2017-04-23 DIAGNOSIS — I10 Essential (primary) hypertension: Secondary | ICD-10-CM

## 2017-04-23 DIAGNOSIS — R202 Paresthesia of skin: Secondary | ICD-10-CM

## 2017-04-23 DIAGNOSIS — E669 Obesity, unspecified: Secondary | ICD-10-CM | POA: Diagnosis not present

## 2017-04-23 DIAGNOSIS — Z6833 Body mass index (BMI) 33.0-33.9, adult: Secondary | ICD-10-CM

## 2017-04-23 DIAGNOSIS — E1169 Type 2 diabetes mellitus with other specified complication: Secondary | ICD-10-CM

## 2017-04-23 DIAGNOSIS — E039 Hypothyroidism, unspecified: Secondary | ICD-10-CM | POA: Diagnosis not present

## 2017-04-23 LAB — POCT GLYCOSYLATED HEMOGLOBIN (HGB A1C): Hemoglobin A1C: 7.6

## 2017-04-23 MED ORDER — DULAGLUTIDE 0.75 MG/0.5ML ~~LOC~~ SOAJ: 0.7500 mg | SUBCUTANEOUS | 3 refills | Status: DC

## 2017-04-23 NOTE — Patient Instructions (Addendum)
Keep up the GREAT WORK!  We recommend that you schedule a mammogram for breast cancer screening. Typically, you do not need a referral to do this. Please contact a local imaging center to schedule your mammogram.  [facility] - [phone]  *ask for the Radiology Department [facility]) - [phone] or [phone]  [facility] - [phone] [facility] - [phone] [facility] - [phone]  *ask for the Radiology Department [facility] - [phone]  *ask for the Radiology Department [facility] - [phone]  *ask for the Mammography Department [facility] - [phone]  IF you received an x-ray today, you will receive an invoice from [facility]. Please contact  Radiology at [phone] with questions or concerns regarding your invoice.   IF you received labwork today, you will receive an invoice from [facility]. Please contact [facility] at [phone] with questions or concerns regarding your invoice.   Our billing staff will not be able to assist you with questions regarding bills from these companies.  You will be contacted with the lab results as soon as they are available. The fastest way to get your results is to activate your My Chart account. Instructions are located on the last page of this paperwork. If you have not heard from us regarding the results in 2 weeks, please contact this office.     

## 2017-04-23 NOTE — Progress Notes (Signed)
Patient ID: Jessica Knox, female    DOB: [birth year], 51 y.o.   MRN: [id number]  PCP: Harrison Mons, PA-C  Chief Complaint  Patient presents with  . Diabetes    A1C  . Follow-up    Subjective:   Presents for evaluation of diabetes.  COntinues to improve slowly, but remains impatient and frustrated at her reduced stamina and inability to work.  Had an episode of nausea. Thought she was hypoglycemic, and reading was 134. Had other, milder, episodes like this. Usually this happens when the reading is below 160. Really struggling to reduce the sugar in her diet, especially in sweet tea.   Dr. Einar Gip is trying to get her qualified for the [facility] injectable cholesterol drug, given that rhabomyolysis may have been due to statin use.  Contrave helps with her appetite cravings. Frustrated that she is still not losing weight. "I feel like a roly poly."  Starting to walk a little more normally. See Dr. Lorin Mercy again at the end of the month. NCS this am shows moderate carpal tunnel syndrome on the RIGHT.    Review of Systems Denies chest pain, shortness of breath, HA, dizziness, vision change, vomiting, diarrhea, constipation, melena, hematochezia, dysuria, increased urinary urgency or frequency, increased hunger or thirst, unintentional weight change, new/unexplained myalgias or arthralgias, rash.     Patient Active Problem List   Diagnosis Date Noted  . Numbness of right hand 04/04/2017  . Estrogen deficiency 01/22/2017  . Traumatic compartment syndrome of right lower extremity ([place])   . Leg swelling   . Non-traumatic rhabdomyolysis   . Rash 09/21/2016  . Transaminitis 09/21/2016  . Hypothyroidism 07/30/2016  . Essential hypertension 07/30/2016  . Diabetes mellitus type 2 in obese ([name]) 07/30/2016  . Adjustment disorder with mixed anxiety and depressed mood 07/30/2016  . History of colonic polyps 07/30/2016  . Former smoker 01/26/2015  . BMI 28.0-28.9,adult 01/26/2015    . Stress incontinence 01/26/2015  . Adnexal pain 12/17/2014  . Abdominal pain 04/24/2013  . Pancreatitis 04/24/2013     Prior to Admission medications   Medication Sig Start Date End Date Taking? Authorizing Provider  ADVANCED FIBER COMPLEX PO Take 1 each by mouth 2 (two) times daily.    Yes [provider]  ALPRAZolam Duanne Moron) 0.5 MG tablet TAKE 1 TABLET BY MOUTH TWICE DAILY AS NEEDED FOR ANXIETY 02/19/17  Yes Linnaea Ahn, PA-C  aspirin EC 81 MG tablet Take 81 mg by mouth daily.   Yes [provider]  Biotin w/ Vitamins C & E (HAIR/SKIN/NAILS PO) Take 1 tablet by mouth daily.   Yes [provider]  Blood Glucose Monitoring Suppl KIT Check blood sugar daily as needed 08/03/16  Yes Siyona Coto, PA-C  buPROPion (WELLBUTRIN SR) 100 MG 12 hr tablet Take 2 tablets (200 mg total) by mouth 2 (two) times daily. 03/21/17  Yes Jacody Beneke, PA-C  CARTIA XT 180 MG 24 hr capsule TAKE ONE CAPSULE BY MOUTH DAILY 01/22/17  Yes Jaynee Eagles, PA-C  cetirizine (ZYRTEC) 10 MG tablet Take 10 mg by mouth daily.   Yes [provider]  dapagliflozin propanediol (FARXIGA) 10 MG TABS tablet Take 10 mg by mouth daily. 01/22/17  Yes Aceyn Kathol, PA-C  esomeprazole (NEXIUM) 40 MG capsule TAKE 1 CAPSULE DAILY 03/04/17  Yes Tenika Keeran, PA-C  estradiol (ESTRACE) 1 MG tablet Take 1 tablet (1 mg total) by mouth daily. 01/22/17  Yes Shaliyah Taite, PA-C  fluticasone (FLONASE) 50 MCG/ACT nasal spray Place 1  spray into both nostrils daily as needed for allergies or rhinitis.   Yes [provider]  glipiZIDE (GLUCOTROL) 10 MG tablet TAKE 1 TABLET TWICE A DAY BEFORE MEALS 03/20/17  Yes Gage Weant, PA-C  glucose blood test strip Use as instructed 08/28/16  Yes Deshon Koslowski, PA-C  Ipratropium-Albuterol (COMBIVENT) 20-100 MCG/ACT AERS respimat Inhale 1 puff into the lungs every 6 (six) hours. 08/28/16  Yes Kiaira Pointer, PA-C  Lancets (ONETOUCH ULTRASOFT) lancets  Test blood sugar daily. 08/17/16  Yes Tanija Germani, PA-C  levothyroxine (SYNTHROID, LEVOTHROID) 175 MCG tablet Take 1 tablet (175 mcg total) by mouth daily before breakfast. 04/18/17  Yes Charle Clear, PA-C  meloxicam (MOBIC) 15 MG tablet TAKE 1 TABLET DAILY 01/07/17  Yes Dub Maclellan, PA-C  metoprolol succinate (TOPROL-XL) 50 MG 24 hr tablet Take 50 mg by mouth 2 (two) times daily. Take with or immediately following a meal.   Yes [provider]  naltrexone (DEPADE) 50 MG tablet 25 mg PO QAM x 1 week, then 25 mg PO BID x 1 week, then 50 mg PO QAM and 25 mg PO QPM x 1 week, then 50 mg PO BID 03/21/17  Yes Diandra Cimini, PA-C  Naltrexone-Bupropion HCl ER 8-90 MG TB12 Take 2 tablets by mouth 2 (two) times daily. 03/11/17  Yes Augustino Savastano, PA-C  nitrofurantoin (MACRODANTIN) 50 MG capsule TAKE 1 CAPSULE AT BEDTIME 03/06/17  Yes Candela Krul, PA-C  ondansetron (ZOFRAN-ODT) 8 MG disintegrating tablet Take 1 tablet (8 mg total) by mouth every 8 (eight) hours as needed for nausea. 07/19/16  Yes Wardell Honour, MD  PROAIR HFA [phone] Base) MCG/ACT inhaler USE 2 INHALATIONS EVERY 4 HOURS AS NEEDED FOR WHEEZING OR SHORTNESS OF BREATH (COUGH, SHORTNESS OF BREATH OR WHEEZING) 11/12/16  Yes Treesa Mccully, PA-C  traMADol (ULTRAM) 50 MG tablet Take one tablet by mouth every 8 hours as needed for pain. 04/10/17  Yes Marybelle Killings, MD  valsartan (DIOVAN) 160 MG tablet Take 1 tablet (160 mg total) by mouth daily. 11/06/16  Yes Ray Gervasi, PA-C  dapagliflozin propanediol (FARXIGA) 5 MG TABS tablet Take 5 mg by mouth daily. Patient not taking: Reported on 04/23/2017 11/20/16   Harrison Mons, PA-C  valsartan (DIOVAN) 160 MG tablet TAKE 1 TABLET DAILY Patient not taking: Reported on 04/23/2017 01/25/17   Harrison Mons, PA-C     Allergies  Allergen Reactions  . Atorvastatin Swelling and Other (See Comments)    Edema  . Ciprofloxacin Palpitations and Other (See Comments)    Can take in spaced,  small doses  . Morphine And Related Itching       Objective:  Physical Exam  Constitutional: She is oriented to person, place, and time. She appears well-developed and well-nourished. She is active and cooperative. No distress.  BP 126/80 (BP Location: Right Arm, Patient Position: Sitting, Cuff Size: Normal)   Pulse 88   Temp 97.6 F (36.4 C) (Oral)   Resp 18   Ht 4' 11.75" (1.518 m)   Wt 170 lb 4.8 oz (77.2 kg)   SpO2 95%   BMI 33.54 kg/m   HENT:  Head: Normocephalic and atraumatic.  Right Ear: Hearing normal.  Left Ear: Hearing normal.  Eyes: Conjunctivae are normal. No scleral icterus.  Neck: Normal range of motion. Neck supple. No thyromegaly present.  Cardiovascular: Normal rate, regular rhythm and normal heart sounds.   Pulses:      Radial pulses are 2+ on the right side, and 2+ on the  left side.  Pulmonary/Chest: Effort normal and breath sounds normal.  Lymphadenopathy:       Head (right side): No tonsillar, no preauricular, no posterior auricular and no occipital adenopathy present.       Head (left side): No tonsillar, no preauricular, no posterior auricular and no occipital adenopathy present.    She has no cervical adenopathy.       Right: No supraclavicular adenopathy present.       Left: No supraclavicular adenopathy present.  Neurological: She is alert and oriented to person, place, and time. No sensory deficit.  Skin: Skin is warm, dry and intact. No rash noted. No cyanosis or erythema. Nails show no clubbing.  Psychiatric: She has a normal mood and affect. Her speech is normal and behavior is normal.     Wt Readings from Last 3 Encounters:  04/23/17 170 lb 4.8 oz (77.2 kg)  04/03/17 165 lb (74.8 kg)  01/22/17 159 lb (72.1 kg)       Assessment & Plan:   Problem List Items Addressed This Visit    BMI 33.0-33.9,adult    Has been resistant, but willing to try getting some help.      Relevant Orders   Amb Ref to Medical Weight Management    Hypothyroidism    Await labs. Adjust regimen as indicated by results.       Relevant Orders   TSH (Completed)   T4, free (Completed)   Essential hypertension    Controlled. Continue current treatment.      Relevant Orders   CBC with Differential/Platelet (Completed)   Comprehensive metabolic panel (Completed)   Diabetes mellitus type 2 in obese [facility]) - Primary    Lab Results  Component Value Date   HGBA1C 7.6 04/23/2017   Improved! Continue her efforts! Trial of Trulicity.      Relevant Medications   Dulaglutide (TRULICITY) 7.00 KW/4.9CS SOPN   Other Relevant Orders   Comprehensive metabolic panel (Completed)   Lipid panel (Completed)   POCT glycosylated hemoglobin (Hb A1C) (Completed)       Return in about 3 months (around 07/24/2017) for re-evaluation of diabetes, blood pressure, cholesterol.   Fara Chute, PA-C [facility] [place]

## 2017-04-23 NOTE — Progress Notes (Deleted)
Right hand dominant. Numbness and pain. Difficulty writing, driving. Constantly shakes hand to relieve numbness. Symptoms are mostly in right hand and wrist. She is beginning to have symptoms on left as well.  diabetes A1c December 2017 was 8.0. Positive for history of rhabdomyolysis possibly related to a statin. History of hypothyroidism, history of pancreatitis.

## 2017-04-24 ENCOUNTER — Other Ambulatory Visit: Payer: Self-pay | Admitting: Physician Assistant

## 2017-04-24 DIAGNOSIS — E2839 Other primary ovarian failure: Secondary | ICD-10-CM

## 2017-04-24 DIAGNOSIS — F4323 Adjustment disorder with mixed anxiety and depressed mood: Secondary | ICD-10-CM

## 2017-04-24 LAB — COMPREHENSIVE METABOLIC PANEL
A/G RATIO: 2 (ref 1.2–2.2)
ALBUMIN: 5 g/dL (ref 3.5–5.5)
ALT: 52 IU/L — AB (ref 0–32)
AST: 36 IU/L (ref 0–40)
Alkaline Phosphatase: 117 IU/L (ref 39–117)
BUN / CREAT RATIO: 29 — AB (ref 9–23)
BUN: 19 mg/dL (ref 6–24)
Bilirubin Total: 0.2 mg/dL (ref 0.0–1.2)
CALCIUM: 9.9 mg/dL (ref 8.7–10.2)
CO2: 22 mmol/L (ref 20–29)
Chloride: 97 mmol/L (ref 96–106)
Creatinine, Ser: 0.66 mg/dL (ref 0.57–1.00)
GFR, EST AFRICAN AMERICAN: 120 mL/min/{1.73_m2} (ref >59)
GFR, EST NON AFRICAN AMERICAN: 104 mL/min/{1.73_m2} (ref >59)
GLOBULIN, TOTAL: 2.5 g/dL (ref 1.5–4.5)
Glucose: 164 mg/dL — ABNORMAL HIGH (ref 65–99)
POTASSIUM: 4.7 mmol/L (ref 3.5–5.2)
Sodium: 142 mmol/L (ref 134–144)
TOTAL PROTEIN: 7.5 g/dL (ref 6.0–8.5)

## 2017-04-24 LAB — CBC WITH DIFFERENTIAL/PLATELET
BASOS: 0 %
Basophils Absolute: 0 10*3/uL (ref 0.0–0.2)
EOS (ABSOLUTE): 0.1 10*3/uL (ref 0.0–0.4)
EOS: 2 %
HEMATOCRIT: 45.8 % (ref 34.0–46.6)
HEMOGLOBIN: 15.1 g/dL (ref 11.1–15.9)
IMMATURE GRANS (ABS): 0 10*3/uL (ref 0.0–0.1)
IMMATURE GRANULOCYTES: 1 %
LYMPHS: 30 %
Lymphocytes Absolute: 2.3 10*3/uL (ref 0.7–3.1)
MCH: 29.3 pg (ref 26.6–33.0)
MCHC: 33 g/dL (ref 31.5–35.7)
MCV: 89 fL (ref 79–97)
MONOCYTES: 7 %
Monocytes Absolute: 0.5 10*3/uL (ref 0.1–0.9)
NEUTROS PCT: 60 %
Neutrophils Absolute: 4.7 10*3/uL (ref 1.4–7.0)
PLATELETS: 291 10*3/uL (ref 150–379)
RBC: 5.16 x10E6/uL (ref 3.77–5.28)
RDW: 14.1 % (ref 12.3–15.4)
WBC: 7.7 10*3/uL (ref 3.4–10.8)

## 2017-04-24 LAB — LIPID PANEL
CHOL/HDL RATIO: 5.3 ratio — AB (ref 0.0–4.4)
Cholesterol, Total: 229 mg/dL — ABNORMAL HIGH (ref 100–199)
HDL: 43 mg/dL (ref >39)
Triglycerides: 589 mg/dL (ref 0–149)

## 2017-04-24 LAB — TSH: TSH: 2.82 u[IU]/mL (ref 0.450–4.500)

## 2017-04-24 LAB — T4, FREE: FREE T4: 1.51 ng/dL (ref 0.82–1.77)

## 2017-04-24 NOTE — Telephone Encounter (Signed)
Left message to return message regarding medication refill request

## 2017-04-25 ENCOUNTER — Encounter: Payer: Self-pay | Admitting: Physician Assistant

## 2017-04-25 MED ORDER — NALTREXONE HCL 50 MG PO TABS: 50.0000 mg | ORAL_TABLET | Freq: Two times a day (BID) | ORAL | 2 refills | Status: DC

## 2017-04-25 NOTE — Telephone Encounter (Signed)
Patient notified via My Chart.  Meds ordered this encounter  Medications  . ALPRAZolam (XANAX) 0.5 MG tablet    Sig: TAKE 1 TABLET BY MOUTH TWICE DAILY AS NEEDED FOR ANXIETY    Dispense:  30 tablet    Refill:  0

## 2017-04-25 NOTE — Telephone Encounter (Signed)
[facility] [place] [address] [place] [address]

## 2017-04-26 NOTE — Procedures (Signed)
EMG & NCV Findings: Evaluation of the right median motor nerve showed prolonged distal onset latency (4.5 ms) and decreased conduction velocity (Elbow-Wrist, 49 m/s).  The right median (across palm) sensory nerve showed prolonged distal peak latency (Wrist, 4.5 ms).  All remaining nerves (as indicated in the following tables) were within normal limits.    All examined muscles (as indicated in the following table) showed no evidence of electrical instability.    Impression: The above electrodiagnostic study is ABNORMAL and reveals evidence of a just moderate right median nerve entrapment at the wrist (carpal tunnel syndrome) affecting sensory and motor components. *4 clinical correlation is paramount.  There is no significant electrodiagnostic evidence of any other focal nerve entrapment, brachial plexopathy or cervical radiculopathy.  As you know, this particular electrodiagnostic study cannot rule out chemical radiculitis or sensory only radiculopathy.   Recommendations: 1.  Follow-up with referring physician. 2.  Continue current management of symptoms. 3.  Continue use of resting splint at night-time and as needed during the day.   Nerve Conduction Studies Anti Sensory Summary Table   Stim Site NR Peak (ms) Norm Peak (ms) P-T Amp (V) Norm P-T Amp Site1 Site2 Delta-P (ms) Dist (cm) Vel (m/s) Norm Vel (m/s)  Right Median Acr Palm Anti Sensory (2nd Digit)  31.2C  Wrist    *4.5 <3.6 20.6 >10 Wrist Palm 2.6 0.0    Palm    1.9 <2.0 7.7         Right Radial Anti Sensory (Base 1st Digit)  30.8C  Wrist    1.9 <3.1 19.8  Wrist Base 1st Digit 1.9 0.0    Right Ulnar Anti Sensory (5th Digit)  31.1C  Wrist    3.1 <3.7 25.4 >15.0 Wrist 5th Digit 3.1 14.0 45 >38   Motor Summary Table   Stim Site NR Onset (ms) Norm Onset (ms) O-P Amp (mV) Norm O-P Amp Site1 Site2 Delta-0 (ms) Dist (cm) Vel (m/s) Norm Vel (m/s)  Right Median Motor (Abd Poll Brev)  30.4C  Wrist    *4.5 <4.2 7.5 >5 Elbow Wrist  3.8 18.5 *49 >50  Elbow    8.3  7.2         Right Ulnar Motor (Abd Dig Min)  30.5C  Wrist    2.9 <4.2 10.5 >3 B Elbow Wrist 2.7 17.0 63 >53  B Elbow    5.6  12.7  A Elbow B Elbow 1.4 10.0 71 >53  A Elbow    7.0  12.5          EMG   Side Muscle Nerve Root Ins Act Fibs Psw Amp Dur Poly Recrt Int Dennie Bible Comment  Right Abd Poll Brev Median C8-T1 Nml Nml Nml Nml Nml 0 Nml Nml   Right 1stDorInt Ulnar C8-T1 Nml Nml Nml Nml Nml 0 Nml Nml   Right PronatorTeres Median C6-7 Nml Nml Nml Nml Nml 0 Nml Nml   Right Biceps Musculocut C5-6 Nml Nml Nml Nml Nml 0 Nml Nml   Right Deltoid Axillary C5-6 Nml Nml Nml Nml Nml 0 Nml Nml     Nerve Conduction Studies Anti Sensory Left/Right Comparison   Stim Site L Lat (ms) R Lat (ms) L-R Lat (ms) L Amp (V) R Amp (V) L-R Amp (%) Site1 Site2 L Vel (m/s) R Vel (m/s) L-R Vel (m/s)  Median Acr Palm Anti Sensory (2nd Digit)  31.2C  Wrist  *4.5   20.6  Wrist Palm     Palm  1.9  7.7        Radial Anti Sensory (Base 1st Digit)  30.8C  Wrist  1.9   19.8  Wrist Base 1st Digit     Ulnar Anti Sensory (5th Digit)  31.1C  Wrist  3.1   25.4  Wrist 5th Digit  45    Motor Left/Right Comparison   Stim Site L Lat (ms) R Lat (ms) L-R Lat (ms) L Amp (mV) R Amp (mV) L-R Amp (%) Site1 Site2 L Vel (m/s) R Vel (m/s) L-R Vel (m/s)  Median Motor (Abd Poll Brev)  30.4C  Wrist  *4.5   7.5  Elbow Wrist  *49   Elbow  8.3   7.2        Ulnar Motor (Abd Dig Min)  30.5C  Wrist  2.9   10.5  B Elbow Wrist  63   B Elbow  5.6   12.7  A Elbow B Elbow  71   A Elbow  7.0   12.5

## 2017-04-26 NOTE — Progress Notes (Signed)
Jessica Knox - 51 y.o. female MRN [id number]  Date of birth: January 22, 1966  Office Visit Note: Visit Date: 04/23/2017 PCP: Porfirio Oar, PA-C Referred by: Porfirio Oar, PA-C  Subjective: Chief Complaint  Patient presents with  . Right Hand - Numbness, Pain   HPI: [name] is right-hand dominant female with chronic worsening right hand pain and numbness. She reports mostly symptoms in the radial digits but sometimes feels that is the whole hand. She reports some pain in the wrist as well with movement. She has difficulty riding and driving. She constantly has to shake her hand to relieve the numbness. She has essentially a positive flick sign. She has a history of diabetes with her last A1c in December of this past year was an 8. She has a history of rhabdomyolysis that she reports may been related to a statin. She also has a history of hypothyroidism and history of pancreatitis. She denies any frank radicular symptoms down the arms. She does get some arm pain however. She denies any specific injury. She has not had prior electrodiagnostic studies.    ROS Otherwise per HPI.  Assessment & Plan: Visit Diagnoses:  1. Paresthesia of skin     Plan: No additional findings.  Impression: The above electrodiagnostic study is ABNORMAL and reveals evidence of a just moderate right median nerve entrapment at the wrist (carpal tunnel syndrome) affecting sensory and motor components. *4 clinical correlation is paramount.  There is no significant electrodiagnostic evidence of any other focal nerve entrapment, brachial plexopathy or cervical radiculopathy.  As you know, this particular electrodiagnostic study cannot rule out chemical radiculitis or sensory only radiculopathy.   Recommendations: 1.  Follow-up with referring physician. 2.  Continue current management of symptoms. 3.  Continue use of resting splint at night-time and as needed during the day.   Meds & Orders: No orders of the  defined types were placed in this encounter.   Orders Placed This Encounter  Procedures  . NCV with EMG (electromyography)    Follow-up: Return in about 1 week (around 04/30/2017).   Procedures: No procedures performed  EMG & NCV Findings: Evaluation of the right median motor nerve showed prolonged distal onset latency (4.5 ms) and decreased conduction velocity (Elbow-Wrist, 49 m/s).  The right median (across palm) sensory nerve showed prolonged distal peak latency (Wrist, 4.5 ms).  All remaining nerves (as indicated in the following tables) were within normal limits.    All examined muscles (as indicated in the following table) showed no evidence of electrical instability.    Impression: The above electrodiagnostic study is ABNORMAL and reveals evidence of a just moderate right median nerve entrapment at the wrist (carpal tunnel syndrome) affecting sensory and motor components. *4 clinical correlation is paramount.  There is no significant electrodiagnostic evidence of any other focal nerve entrapment, brachial plexopathy or cervical radiculopathy.  As you know, this particular electrodiagnostic study cannot rule out chemical radiculitis or sensory only radiculopathy.   Recommendations: 1.  Follow-up with referring physician. 2.  Continue current management of symptoms. 3.  Continue use of resting splint at night-time and as needed during the day.   Nerve Conduction Studies Anti Sensory Summary Table   Stim Site NR Peak (ms) Norm Peak (ms) P-T Amp (V) Norm P-T Amp Site1 Site2 Delta-P (ms) Dist (cm) Vel (m/s) Norm Vel (m/s)  Right Median Acr Palm Anti Sensory (2nd Digit)  31.2C  Wrist    *4.5 <3.6 20.6 >10 Wrist Palm 2.6 0.0  Palm    1.9 <2.0 7.7         Right Radial Anti Sensory (Base 1st Digit)  30.8C  Wrist    1.9 <3.1 19.8  Wrist Base 1st Digit 1.9 0.0    Right Ulnar Anti Sensory (5th Digit)  31.1C  Wrist    3.1 <3.7 25.4 >15.0 Wrist 5th Digit 3.1 14.0 45 >38   Motor  Summary Table   Stim Site NR Onset (ms) Norm Onset (ms) O-P Amp (mV) Norm O-P Amp Site1 Site2 Delta-0 (ms) Dist (cm) Vel (m/s) Norm Vel (m/s)  Right Median Motor (Abd Poll Brev)  30.4C  Wrist    *4.5 <4.2 7.5 >5 Elbow Wrist 3.8 18.5 *49 >50  Elbow    8.3  7.2         Right Ulnar Motor (Abd Dig Min)  30.5C  Wrist    2.9 <4.2 10.5 >3 B Elbow Wrist 2.7 17.0 63 >53  B Elbow    5.6  12.7  A Elbow B Elbow 1.4 10.0 71 >53  A Elbow    7.0  12.5          EMG   Side Muscle Nerve Root Ins Act Fibs Psw Amp Dur Poly Recrt Int Dennie Bible Comment  Right Abd Poll Brev Median C8-T1 Nml Nml Nml Nml Nml 0 Nml Nml   Right 1stDorInt Ulnar C8-T1 Nml Nml Nml Nml Nml 0 Nml Nml   Right PronatorTeres Median C6-7 Nml Nml Nml Nml Nml 0 Nml Nml   Right Biceps Musculocut C5-6 Nml Nml Nml Nml Nml 0 Nml Nml   Right Deltoid Axillary C5-6 Nml Nml Nml Nml Nml 0 Nml Nml     Nerve Conduction Studies Anti Sensory Left/Right Comparison   Stim Site L Lat (ms) R Lat (ms) L-R Lat (ms) L Amp (V) R Amp (V) L-R Amp (%) Site1 Site2 L Vel (m/s) R Vel (m/s) L-R Vel (m/s)  Median Acr Palm Anti Sensory (2nd Digit)  31.2C  Wrist  *4.5   20.6  Wrist Palm     Palm  1.9   7.7        Radial Anti Sensory (Base 1st Digit)  30.8C  Wrist  1.9   19.8  Wrist Base 1st Digit     Ulnar Anti Sensory (5th Digit)  31.1C  Wrist  3.1   25.4  Wrist 5th Digit  45    Motor Left/Right Comparison   Stim Site L Lat (ms) R Lat (ms) L-R Lat (ms) L Amp (mV) R Amp (mV) L-R Amp (%) Site1 Site2 L Vel (m/s) R Vel (m/s) L-R Vel (m/s)  Median Motor (Abd Poll Brev)  30.4C  Wrist  *4.5   7.5  Elbow Wrist  *49   Elbow  8.3   7.2        Ulnar Motor (Abd Dig Min)  30.5C  Wrist  2.9   10.5  B Elbow Wrist  63   B Elbow  5.6   12.7  A Elbow B Elbow  71   A Elbow  7.0   12.5              Clinical History: No specialty comments available.  She reports that she quit smoking about 8 months ago. Her smoking use included Cigarettes. She has a 30.00 pack-year  smoking history. She has never used smokeless tobacco.   Recent Labs  10/23/16 1517 01/22/17 1658 03/13/17 1450 04/23/17 1454  HGBA1C 8.0 8.1  --  7.6  LABURIC  --   --  3.2  --     Objective:  VS:  HT:    WT:   BMI:     BP:   HR: bpm  TEMP: ( )  RESP:  Physical Exam  Musculoskeletal:  Inspection reveals no atrophy of the bilateral APB or FDI or hand intrinsics. There is no swelling, color changes, allodynia or dystrophic changes. There is 5 out of 5 strength in the bilateral wrist extension, finger abduction and long finger flexion. There is intact sensation to light touch in all dermatomal and peripheral nerve distributions. There is a negative Hoffmann's test bilaterally.    Ortho Exam Imaging: No results found.  Past Medical/Family/Surgical/Social History: Medications & Allergies reviewed per EMR Patient Active Problem List   Diagnosis Date Noted  . Numbness of right hand 04/04/2017  . Estrogen deficiency 01/22/2017  . 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 Leg swelling   . Non-traumatic rhabdomyolysis   . Rash 09/21/2016  . Transaminitis 09/21/2016  . Hypothyroidism 07/30/2016  . Essential hypertension 07/30/2016  . Diabetes mellitus type 2 in obese (HCC) 07/30/2016  . Adjustment disorder with mixed anxiety and depressed mood 07/30/2016  . History of colonic polyps 07/30/2016  . Former smoker 01/26/2015  . BMI 33.0-33.9,adult 01/26/2015  . Stress incontinence 01/26/2015  . Adnexal pain 12/17/2014  . Abdominal pain 04/24/2013  . Pancreatitis 04/24/2013   Past Medical History:  Diagnosis Date  . Diabetes mellitus without complication (HCC)   . GERD (gastroesophageal reflux disease)   . Hypertension   . SVT (supraventricular tachycardia) (HCC)   . Thyroid disease    multinodular thyroid total thyroidectomy   Family History  Problem Relation Age of Onset  . Adopted: Yes  . Family history unknown: Yes   Past Surgical History:    Procedure Laterality Date  . ABDOMINAL HYSTERECTOMY    . CHOLECYSTECTOMY    . RIGHT OOPHORECTOMY  12/06/2012  . RIGHT OOPHORECTOMY Right   . ROBOTIC ASSISTED DIAGNOSTIC LAPAROSCOPY  01/10/2015  . THYROIDECTOMY     Social History   Occupational History  . unemployed     Charity fundraiserN   Social History Main Topics  . Smoking status: Former Smoker    Packs/day: 1.00    Years: 30.00    Types: Cigarettes    Quit date: 08/02/2016  . Smokeless tobacco: Never Used  . Alcohol use 0.0 oz/week  . Drug use: No  . Sexual activity: Not on file

## 2017-04-28 NOTE — Assessment & Plan Note (Signed)
Await labs. Adjust regimen as indicated by results.  

## 2017-04-28 NOTE — Assessment & Plan Note (Signed)
Controlled. Continue current treatment. 

## 2017-04-28 NOTE — Assessment & Plan Note (Signed)
Has been resistant, but willing to try getting some help.

## 2017-04-28 NOTE — Assessment & Plan Note (Addendum)
Lab Results  Component Value Date   HGBA1C 7.6 04/23/2017   Improved! Continue her efforts! Trial of Trulicity.

## 2017-04-30 ENCOUNTER — Telehealth: Payer: Self-pay

## 2017-04-30 ENCOUNTER — Ambulatory Visit (INDEPENDENT_AMBULATORY_CARE_PROVIDER_SITE_OTHER): Payer: BLUE CROSS/BLUE SHIELD | Admitting: Orthopaedic Surgery

## 2017-04-30 NOTE — Telephone Encounter (Signed)
Received PA notification from pharmacy Called insurance to initiate PA request for NALTREXONE Insurance states it was a pharmacy error, Naltrexone was covered with a paid claim on 6/21

## 2017-05-14 ENCOUNTER — Encounter (INDEPENDENT_AMBULATORY_CARE_PROVIDER_SITE_OTHER): Payer: Self-pay | Admitting: Surgery

## 2017-05-14 ENCOUNTER — Ambulatory Visit (INDEPENDENT_AMBULATORY_CARE_PROVIDER_SITE_OTHER): Payer: BLUE CROSS/BLUE SHIELD | Admitting: Orthopaedic Surgery

## 2017-05-14 ENCOUNTER — Encounter (INDEPENDENT_AMBULATORY_CARE_PROVIDER_SITE_OTHER): Payer: Self-pay | Admitting: Orthopaedic Surgery

## 2017-05-14 VITALS — BP 112/76 | HR 70 | Ht 59.75 in | Wt 170.0 lb

## 2017-05-14 DIAGNOSIS — G5601 Carpal tunnel syndrome, right upper limb: Secondary | ICD-10-CM | POA: Diagnosis not present

## 2017-05-16 ENCOUNTER — Encounter: Payer: Self-pay | Admitting: Physician Assistant

## 2017-05-17 NOTE — Progress Notes (Signed)
Office Visit Note   Patient: Jessica Knox           Date of Birth: 1966-07-01           MRN: [id number] Visit Date: 05/14/2017              Requested by: Porfirio Oar, PA-C [address] [place], [place] [address] PCP: Porfirio Oar, PA-C   Assessment & Plan: Visit Diagnoses:  1. Carpal tunnel syndrome, right upper limb     Plan: Continue night splint. She can consider outpatient carpal tunnel release since she is having continued symptoms dropping objects it wakes her up at night she has weakness in the hand with persistent numbness and pain. We discussed the surgery under IV sedation with local anesthesia. She'll call should like to schedule.  Follow-Up Instructions: Return if symptoms worsen or fail to improve.   Orders:  No orders of the defined types were placed in this encounter.  No orders of the defined types were placed in this encounter.     Procedures: No procedures performed   Clinical Data: No additional findings.   Subjective: Chief Complaint  Patient presents with  . Right Arm - Follow-up    HPI 51 year old female returns for ongoing problems with right hand numbness and nerve conduction velocities were performed on 04/24/2007 and were positive for moderate right median nerve compression at the wrist (carpal tunnel syndrome).  Review of Systems is systems positive for diabetes, nontraumatic rhabdomyolysis with compartment syndrome possibly related to statins. History of hypothyroidism, pancreatitis. Past smoker. Previous left salpingo-oophorectomy robotic assisted. Positive for lumbar disc protrusion L4-5, L5-S1.   Objective: Vital Signs: BP 112/76 (BP Location: Right Arm, Patient Position: Sitting, Cuff Size: Normal)   Pulse 70   Ht 4' 11.75" (1.518 m)   Wt 170 lb (77.1 kg)   BMI 33.48 kg/m   Physical Exam  Constitutional: She is oriented to person, place, and time. She appears well-developed.  HENT:  Head: Normocephalic.  Right Ear:  External ear normal.  Left Ear: External ear normal.  Eyes: Pupils are equal, round, and reactive to light.  Neck: No tracheal deviation present. No thyromegaly present.  Cardiovascular: Normal rate.   Pulmonary/Chest: Effort normal.  Abdominal: Soft.  Neurological: She is alert and oriented to person, place, and time.  Skin: Skin is warm and dry.  Psychiatric: She has a normal mood and affect. Her behavior is normal.    Ortho Exam positive Tinel's over the carpal canal. She has some thenar weakness on the right side only. Positive Phalen's test.  Specialty Comments:  No specialty comments available.  Imaging: No results found.   PMFS History: Patient Active Problem List   Diagnosis Date Noted  . Numbness of right hand 04/04/2017  . Estrogen deficiency 01/22/2017  . 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 Leg swelling   . Non-traumatic rhabdomyolysis   . Rash 09/21/2016  . Transaminitis 09/21/2016  . Hypothyroidism 07/30/2016  . Essential hypertension 07/30/2016  . Diabetes mellitus type 2 in obese (HCC) 07/30/2016  . Adjustment disorder with mixed anxiety and depressed mood 07/30/2016  . History of colonic polyps 07/30/2016  . Former smoker 01/26/2015  . BMI 33.0-33.9,adult 01/26/2015  . Stress incontinence 01/26/2015  . Adnexal pain 12/17/2014  . Abdominal pain 04/24/2013  . Pancreatitis 04/24/2013   Past Medical History:  Diagnosis Date  . Diabetes mellitus without complication (HCC)   . GERD (gastroesophageal reflux disease)   . Hypertension   .  SVT (supraventricular tachycardia) (HCC)   . Thyroid disease    multinodular thyroid total thyroidectomy    Family History  Problem Relation Age of Onset  . Adopted: Yes  . Family history unknown: Yes    Past Surgical History:  Procedure Laterality Date  . ABDOMINAL HYSTERECTOMY    . CHOLECYSTECTOMY    . RIGHT OOPHORECTOMY  12/06/2012  . RIGHT OOPHORECTOMY Right   . ROBOTIC ASSISTED  DIAGNOSTIC LAPAROSCOPY  01/10/2015  . THYROIDECTOMY     Social History   Occupational History  . unemployed     Charity fundraiserN   Social History Main Topics  . Smoking status: Former Smoker    Packs/day: 1.00    Years: 30.00    Types: Cigarettes    Quit date: 08/02/2016  . Smokeless tobacco: Never Used  . Alcohol use 0.0 oz/week  . Drug use: No  . Sexual activity: Not on file

## 2017-05-21 ENCOUNTER — Ambulatory Visit (INDEPENDENT_AMBULATORY_CARE_PROVIDER_SITE_OTHER): Payer: BLUE CROSS/BLUE SHIELD | Admitting: Physician Assistant

## 2017-05-21 ENCOUNTER — Encounter: Payer: Self-pay | Admitting: Physician Assistant

## 2017-05-21 VITALS — BP 114/72 | HR 93 | Temp 98.6°F | Resp 18 | Ht 59.75 in | Wt 164.0 lb

## 2017-05-21 DIAGNOSIS — E2839 Other primary ovarian failure: Secondary | ICD-10-CM | POA: Diagnosis not present

## 2017-05-21 DIAGNOSIS — R200 Anesthesia of skin: Secondary | ICD-10-CM

## 2017-05-21 DIAGNOSIS — E781 Pure hyperglyceridemia: Secondary | ICD-10-CM

## 2017-05-21 DIAGNOSIS — M21371 Foot drop, right foot: Secondary | ICD-10-CM | POA: Diagnosis not present

## 2017-05-21 DIAGNOSIS — R5381 Other malaise: Secondary | ICD-10-CM

## 2017-05-21 NOTE — Patient Instructions (Addendum)
KEEP DOING THE HARD WORK! IT'S WORKING!!! YOU WILL GET THERE. It's a process, a journey.     IF you received an x-ray today, you will receive an invoice from [facility]. Please contact [facility] at [phone] with questions or concerns regarding your invoice.   IF you received labwork today, you will receive an invoice from [facility]. Please contact [facility] at [phone] with questions or concerns regarding your invoice.   Our billing staff will not be able to assist you with questions regarding bills from these companies.  You will be contacted with the lab results as soon as they are available. The fastest way to get your results is to activate your My Chart account. Instructions are located on the last page of this paperwork. If you have not heard from us regarding the results in 2 weeks, please contact this office.

## 2017-05-21 NOTE — Progress Notes (Signed)
Patient ID: Jessica Knox, female    DOB: [birth year], 51 y.o.   MRN: [id number]  PCP: Harrison Mons, PA-C  Chief Complaint  Patient presents with  . Hyperlipidemia  . Follow-up  . Leg Pain    right leg pain    Subjective:   Presents for evaluation of hyperlipidemia.  She was reviewing her recent lab results, with significant increase in TG, and became afraid.   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 She was diagnosed with diabetes, and prescribed metformin and a statin, and then had increased fatigue and leg pain and swelling. She was very resistant to coming in for evaluation, initially insisting that she needed to be on insulin. As her symptoms worsened, she finally presented to the ED, where she was admitted with rhabdomyolysis and significant transaminitis. While hospitalized, she developed compartment syndrome of the RIGHT lower leg and suffers from persistent foot drop.  She struggles with making dietary changes, loves sweet tea, and today acknowledges binge drinking. 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 Exercise is difficult due tot he foot drop and chronic low back pain.  Is using Plexis. Third week. Has lost 2.5 lbs by her scales. She is on the waiting list for the next information session at [facility].  Sometimes lies in bed and "feels like every nerve ending in my body is" flashing.  History of heart palpitations. Not for years. 2 days ago, while lying in bed, recurred. Felt like it may have been Afib or A flutter. 3 episodes, lasting 1-2 beats each. Tried to stay relaxed, to keep the adrenaline down. No associated CP, SOB, dizziness.  Home glucose readings are SIGNIFICANTLY improved. 131 7/17 121 7/15 110 7/12 168 7/01   Review of  Systems     Patient Active Problem List   Diagnosis Date Noted  . Foot drop, right foot 05/21/2017  . Numbness of right hand 04/04/2017  . Estrogen deficiency 01/22/2017  . Leg swelling   . Transaminitis 09/21/2016  . Hypothyroidism 07/30/2016  . Essential hypertension 07/30/2016  . Diabetes mellitus type 2 in obese ([facility]) 07/30/2016  . Adjustment disorder with mixed anxiety and depressed mood 07/30/2016  . History of colonic polyps 07/30/2016  . Former smoker 01/26/2015  . BMI 33.0-33.9,adult 01/26/2015  . Stress incontinence 01/26/2015  . History of pancreatitis 04/24/2013     Prior to Admission medications   Medication Sig Start Date End Date Taking? Authorizing Provider  ADVANCED FIBER COMPLEX PO Take 1 each by mouth 2 (two) times daily.    Yes [provider]  ALPRAZolam Duanne Moron) 0.5 MG tablet TAKE 1 TABLET BY MOUTH TWICE DAILY AS NEEDED FOR ANXIETY 04/25/17  Yes Celina Shiley, PA-C  aspirin EC 81 MG tablet Take 81 mg by mouth daily.   Yes [provider]  Biotin w/ Vitamins C & E (HAIR/SKIN/NAILS PO) Take 1 tablet by mouth daily.   Yes [provider]  Blood Glucose Monitoring Suppl KIT Check blood sugar daily as needed 08/03/16  Yes Shanay Woolman, PA-C  buPROPion (WELLBUTRIN SR) 100 MG 12 hr tablet Take 2 tablets (200 mg total) by mouth 2 (two) times daily. 03/21/17  Yes Zein Helbing, PA-C  CARTIA XT 180 MG 24 hr capsule TAKE ONE CAPSULE  BY MOUTH DAILY 01/22/17  Yes Jaynee Eagles, PA-C  cetirizine (ZYRTEC) 10 MG tablet Take 10 mg by mouth daily.   Yes [provider]  dapagliflozin propanediol (FARXIGA) 10 MG TABS tablet Take 10 mg by mouth daily. 01/22/17  Yes Soundra Lampley, PA-C  Dulaglutide (TRULICITY) 4.09 BD/5.3GD SOPN Inject 0.75 mg into the skin once a week. 04/23/17  Yes Tobias Avitabile, PA-C  esomeprazole (NEXIUM) 40 MG capsule TAKE 1 CAPSULE DAILY 03/04/17  Yes Milton Sagona, PA-C  estradiol (ESTRACE) 1 MG tablet TAKE 1 TABLET  DAILY 04/25/17  Yes Harnoor Kohles, PA-C  fluticasone (FLONASE) 50 MCG/ACT nasal spray Place 1 spray into both nostrils daily as needed for allergies or rhinitis.   Yes [provider]  glipiZIDE (GLUCOTROL) 10 MG tablet TAKE 1 TABLET TWICE A DAY BEFORE MEALS 03/20/17  Yes Williamson Cavanah, PA-C  glucose blood test strip Use as instructed 08/28/16  Yes Draden Cottingham, PA-C  Ipratropium-Albuterol (COMBIVENT) 20-100 MCG/ACT AERS respimat Inhale 1 puff into the lungs every 6 (six) hours. 08/28/16  Yes Cana Mignano, PA-C  Lancets (ONETOUCH ULTRASOFT) lancets Test blood sugar daily. 08/17/16  Yes Ranulfo Kall, PA-C  levothyroxine (SYNTHROID, LEVOTHROID) 175 MCG tablet Take 1 tablet (175 mcg total) by mouth daily before breakfast. 04/18/17  Yes Corinne Goucher, PA-C  meloxicam (MOBIC) 15 MG tablet TAKE 1 TABLET DAILY 01/07/17  Yes Chase Arnall, PA-C  metoprolol succinate (TOPROL-XL) 50 MG 24 hr tablet Take 50 mg by mouth 2 (two) times daily. Take with or immediately following a meal.   Yes [provider]  naltrexone (DEPADE) 50 MG tablet Take 1 tablet (50 mg total) by mouth 2 (two) times daily. 04/25/17  Yes Milynn Quirion, PA-C  nitrofurantoin (MACRODANTIN) 50 MG capsule TAKE 1 CAPSULE AT BEDTIME 03/06/17  Yes Jaxtyn Linville, PA-C  ondansetron (ZOFRAN-ODT) 8 MG disintegrating tablet Take 1 tablet (8 mg total) by mouth every 8 (eight) hours as needed for nausea. 07/19/16  Yes Wardell Honour, MD  PROAIR HFA 108 [phone] Base) MCG/ACT inhaler USE 2 INHALATIONS EVERY 4 HOURS AS NEEDED FOR WHEEZING OR SHORTNESS OF BREATH (COUGH, SHORTNESS OF BREATH OR WHEEZING) 11/12/16  Yes Lashannon Bresnan, PA-C  traMADol (ULTRAM) 50 MG tablet Take one tablet by mouth every 8 hours as needed for pain. 04/10/17  Yes Marybelle Killings, MD  valsartan (DIOVAN) 160 MG tablet Take 1 tablet (160 mg total) by mouth daily. 11/06/16  Yes Harrison Mons, PA-C     Allergies  Allergen Reactions  . Atorvastatin Swelling and  Other (See Comments)    Edema  . Ciprofloxacin Palpitations and Other (See Comments)    Can take in spaced, small doses  . Morphine And Related Itching       Objective:  Physical Exam  Constitutional: She is oriented to person, place, and time. She appears well-developed and well-nourished. She is active and cooperative. No distress.  BP 114/72 (BP Location: Right Arm, Patient Position: Sitting, Cuff Size: Large)   Pulse 93   Temp 98.6 F (37 C) (Oral)   Resp 18   Ht 4' 11.75" (1.518 m)   Wt 164 lb (74.4 kg)   SpO2 97%   BMI 32.30 kg/m   HENT:  Head: Normocephalic and atraumatic.  Right Ear: Hearing normal.  Left Ear: Hearing normal.  Eyes: Conjunctivae are normal. No scleral icterus.  Neck: Normal range of motion. Neck supple. No thyromegaly present.  Cardiovascular: Normal rate, regular rhythm and normal heart sounds.   Pulses:  Radial pulses are 2+ on the right side, and 2+ on the left side.  Pulmonary/Chest: Effort normal and breath sounds normal.  Lymphadenopathy:       Head (right side): No tonsillar, no preauricular, no posterior auricular and no occipital adenopathy present.       Head (left side): No tonsillar, no preauricular, no posterior auricular and no occipital adenopathy present.    She has no cervical adenopathy.       Right: No supraclavicular adenopathy present.       Left: No supraclavicular adenopathy present.  Neurological: She is alert and oriented to person, place, and time. No sensory deficit.  Skin: Skin is warm, dry and intact. No rash noted. No cyanosis or erythema. Nails show no clubbing.  Psychiatric: She has a normal mood and affect. Her speech is normal and behavior is normal.           Assessment & Plan:   Problem List Items Addressed This Visit    Estrogen deficiency   Numbness of right hand    Schedule repeat CTS release at her leisure.      Foot drop, right foot    Continue exercises for strengthening and follow-up with  ortho       Other Visit Diagnoses    Hypertriglyceridemia    -  Primary   repeat labs. Fasting.    Relevant Orders   Lipid panel (Completed)   Physical deconditioning       Relevant Orders   Ambulatory referral to Physical Therapy       Return as planned.   Fara Chute, PA-C [facility]

## 2017-05-22 ENCOUNTER — Encounter: Payer: Self-pay | Admitting: Physician Assistant

## 2017-05-22 LAB — LIPID PANEL
CHOL/HDL RATIO: 5.4 ratio — AB (ref 0.0–4.4)
CHOLESTEROL TOTAL: 248 mg/dL — AB (ref 100–199)
HDL: 46 mg/dL (ref >39)
LDL Calculated: 156 mg/dL — ABNORMAL HIGH (ref 0–99)
TRIGLYCERIDES: 230 mg/dL — AB (ref 0–149)
VLDL Cholesterol Cal: 46 mg/dL — ABNORMAL HIGH (ref 5–40)

## 2017-05-23 ENCOUNTER — Encounter: Payer: Self-pay | Admitting: Physician Assistant

## 2017-05-23 IMAGING — CR DG CHEST 1V PORT
1 series · 1 of 1 positions shown · non-contrast
Comparison: Chest radiograph 08/15/2016

CLINICAL DATA: PICC line placement

EXAM:
PORTABLE CHEST 1 VIEW

[AP]
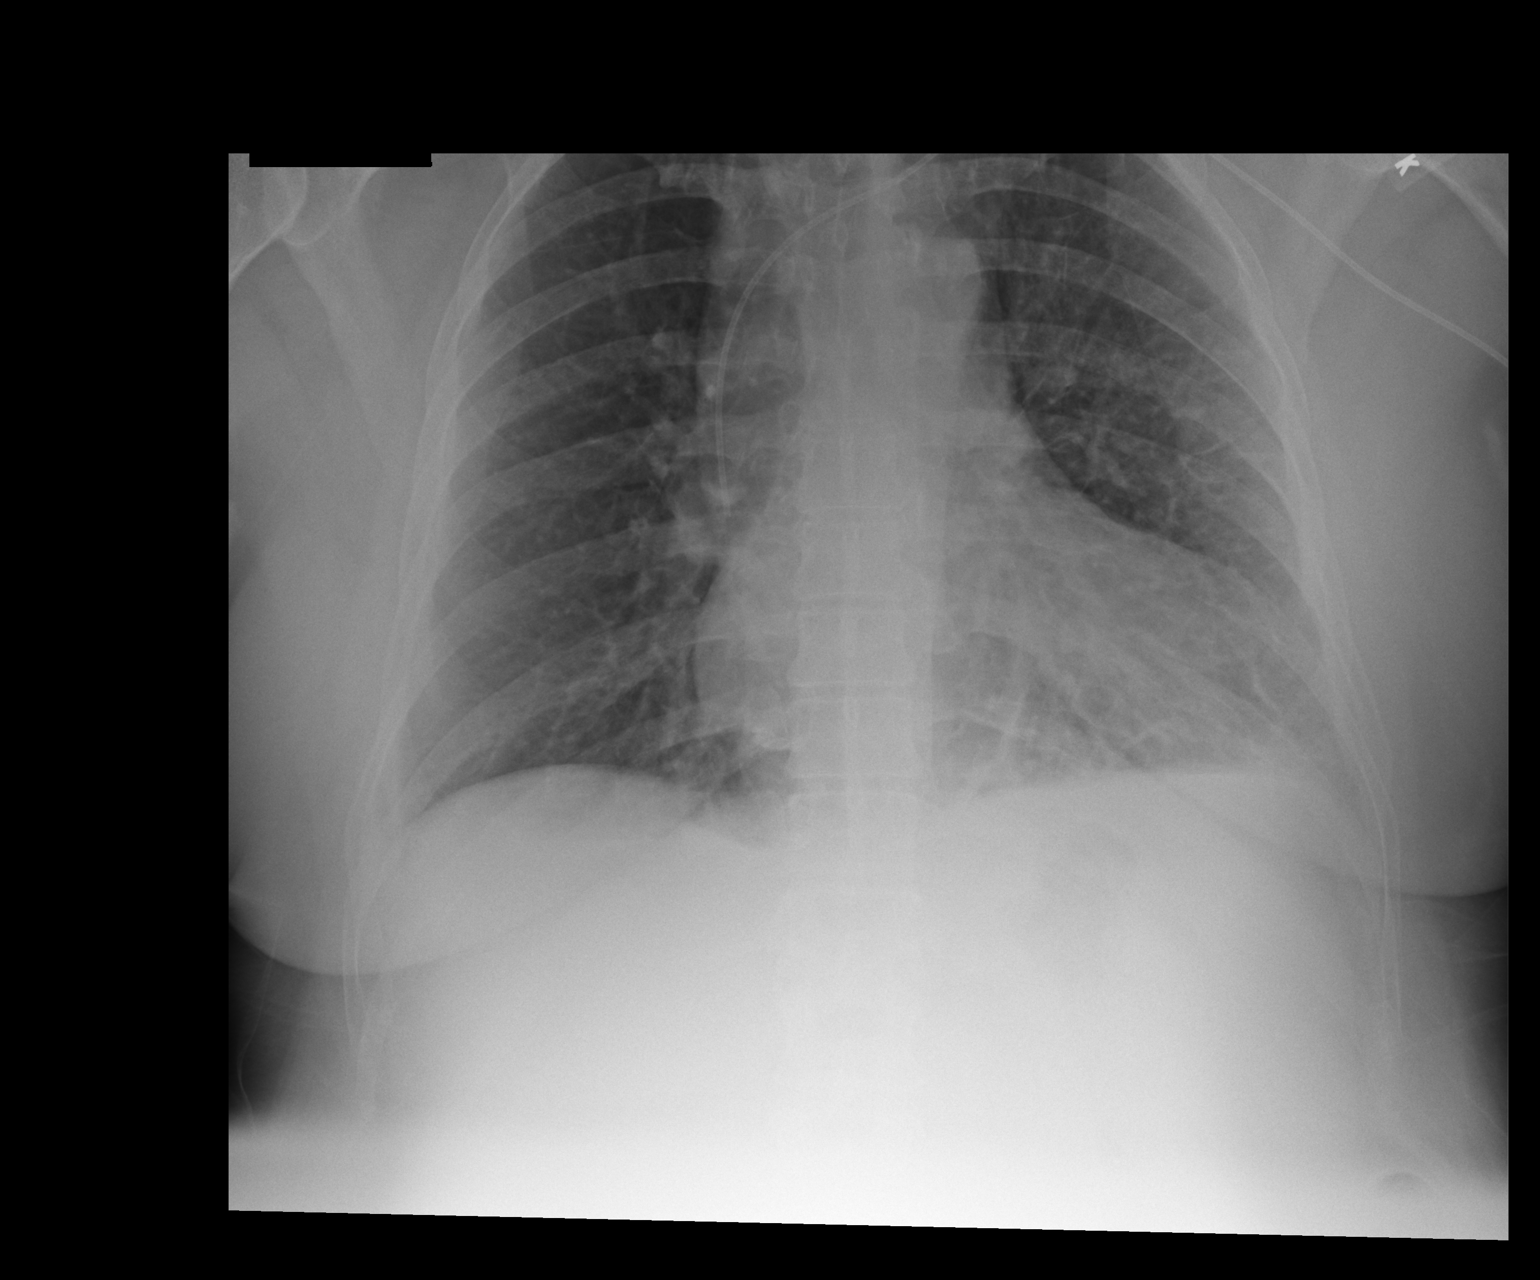

[1 of 1 positions shown; findings below may reference images not displayed]

FINDINGS: A left-sided approach PICC line tip overlies the lower SVC. No focal
airspace disease or pulmonary edema. No pneumothorax.
IMPRESSION: PICC line tip overlying the lower SVC.

## 2017-05-25 MED ORDER — FENOFIBRATE 160 MG PO TABS: 160.0000 mg | ORAL_TABLET | Freq: Every day | ORAL | 3 refills | Status: DC

## 2017-05-26 NOTE — Assessment & Plan Note (Signed)
Schedule repeat CTS release at her leisure.

## 2017-05-26 NOTE — Assessment & Plan Note (Signed)
Continue exercises for strengthening and follow-up with ortho

## 2017-05-29 DIAGNOSIS — Z0271 Encounter for disability determination: Secondary | ICD-10-CM

## 2017-05-31 ENCOUNTER — Other Ambulatory Visit: Payer: Self-pay | Admitting: Physician Assistant

## 2017-06-03 ENCOUNTER — Other Ambulatory Visit: Payer: Self-pay | Admitting: Physician Assistant

## 2017-06-04 ENCOUNTER — Encounter: Payer: Self-pay | Admitting: Physician Assistant

## 2017-06-06 ENCOUNTER — Other Ambulatory Visit: Payer: Self-pay | Admitting: Physician Assistant

## 2017-06-06 DIAGNOSIS — F4323 Adjustment disorder with mixed anxiety and depressed mood: Secondary | ICD-10-CM

## 2017-06-07 NOTE — Telephone Encounter (Signed)
Done

## 2017-06-16 ENCOUNTER — Other Ambulatory Visit: Payer: Self-pay | Admitting: Physician Assistant

## 2017-06-17 NOTE — Telephone Encounter (Signed)
Please advise 

## 2017-06-30 ENCOUNTER — Other Ambulatory Visit: Payer: Self-pay | Admitting: Physician Assistant

## 2017-07-01 ENCOUNTER — Other Ambulatory Visit: Payer: Self-pay | Admitting: Physician Assistant

## 2017-07-01 DIAGNOSIS — F4323 Adjustment disorder with mixed anxiety and depressed mood: Secondary | ICD-10-CM

## 2017-07-01 NOTE — Telephone Encounter (Signed)
Meds ordered this encounter  Medications  . ALPRAZolam (XANAX) 0.5 MG tablet    Sig: TAKE 1 TABLET BY MOUTH TWICE DAILY AS NEEDED FOR ANXIETY    Dispense:  60 tablet    Refill:  0

## 2017-07-02 ENCOUNTER — Telehealth: Payer: Self-pay

## 2017-07-02 ENCOUNTER — Encounter: Payer: Self-pay | Admitting: Physician Assistant

## 2017-07-02 MED ORDER — SERTRALINE HCL 50 MG PO TABS: 50.0000 mg | ORAL_TABLET | Freq: Every day | ORAL | 3 refills | Status: DC

## 2017-07-02 NOTE — Telephone Encounter (Signed)
Meds ordered this encounter  Medications  . sertraline (ZOLOFT) 50 MG tablet    Sig: Take 1 tablet (50 mg total) by mouth daily.    Dispense:  30 tablet    Refill:  3    Order Specific Question:   Supervising Provider    Answer:   Sherren Mocha [phone]   Patient notified via My Chart.

## 2017-07-02 NOTE — Telephone Encounter (Signed)
Received PA for Naltrexone [employer] 50 mg through cover my meds.  When I sent PA to plan it came back immediately as no PA needed.  Pharmacy notified.

## 2017-07-02 NOTE — Telephone Encounter (Signed)
Xanax faxed to pharmacy and received a success notice.

## 2017-07-04 ENCOUNTER — Telehealth: Payer: Self-pay

## 2017-07-04 NOTE — Telephone Encounter (Signed)
Chelle, I have received a request for PA for Naltrxone 50 mg twice.  Both times I was told that no PA is needed.  Today, I received it again, so I called the pharmacy.  The pharmacist told me that it is exceeding the maximum daily limit of once daily. It is written for twice daily, as you know.  What would you like me to do?   Please advise.Marland Kitchen. Bela Nyborg

## 2017-07-05 MED ORDER — NALTREXONE HCL 50 MG PO TABS: 50.0000 mg | ORAL_TABLET | Freq: Every day | ORAL | 2 refills | Status: DC

## 2017-07-05 NOTE — Telephone Encounter (Signed)
I'll re-order it as QD, and let the patient know. Thank you!

## 2017-07-06 ENCOUNTER — Other Ambulatory Visit: Payer: Self-pay | Admitting: Physician Assistant

## 2017-07-11 ENCOUNTER — Other Ambulatory Visit: Payer: Self-pay | Admitting: Physician Assistant

## 2017-07-15 ENCOUNTER — Other Ambulatory Visit: Payer: Self-pay

## 2017-07-15 MED ORDER — DILTIAZEM HCL ER COATED BEADS 180 MG PO CP24: 180.0000 mg | ORAL_CAPSULE | Freq: Every day | ORAL | 1 refills | Status: DC

## 2017-07-15 NOTE — Telephone Encounter (Signed)
Meds ordered this encounter  Medications  . diltiazem (CARTIA XT) 180 MG 24 hr capsule    Sig: Take 1 capsule (180 mg total) by mouth daily.    Dispense:  90 capsule    Refill:  1

## 2017-07-16 ENCOUNTER — Ambulatory Visit (INDEPENDENT_AMBULATORY_CARE_PROVIDER_SITE_OTHER): Payer: BLUE CROSS/BLUE SHIELD | Admitting: Physician Assistant

## 2017-07-16 ENCOUNTER — Encounter: Payer: Self-pay | Admitting: Physician Assistant

## 2017-07-16 VITALS — BP 134/81 | HR 98 | Temp 97.8°F | Resp 18 | Ht 59.5 in | Wt 162.0 lb

## 2017-07-16 DIAGNOSIS — E669 Obesity, unspecified: Secondary | ICD-10-CM | POA: Diagnosis not present

## 2017-07-16 DIAGNOSIS — I10 Essential (primary) hypertension: Secondary | ICD-10-CM | POA: Diagnosis not present

## 2017-07-16 DIAGNOSIS — F4323 Adjustment disorder with mixed anxiety and depressed mood: Secondary | ICD-10-CM | POA: Diagnosis not present

## 2017-07-16 DIAGNOSIS — L989 Disorder of the skin and subcutaneous tissue, unspecified: Secondary | ICD-10-CM

## 2017-07-16 DIAGNOSIS — E039 Hypothyroidism, unspecified: Secondary | ICD-10-CM | POA: Diagnosis not present

## 2017-07-16 DIAGNOSIS — Z23 Encounter for immunization: Secondary | ICD-10-CM | POA: Diagnosis not present

## 2017-07-16 DIAGNOSIS — E1169 Type 2 diabetes mellitus with other specified complication: Secondary | ICD-10-CM

## 2017-07-16 LAB — POCT GLYCOSYLATED HEMOGLOBIN (HGB A1C): Hemoglobin A1C: 6.9

## 2017-07-16 MED ORDER — SERTRALINE HCL 50 MG PO TABS: 50.0000 mg | ORAL_TABLET | Freq: Every day | ORAL | 3 refills | Status: DC

## 2017-07-16 MED ORDER — OLMESARTAN MEDOXOMIL 20 MG PO TABS: 20.0000 mg | ORAL_TABLET | Freq: Every day | ORAL | 3 refills | Status: DC

## 2017-07-16 MED ORDER — ALPRAZOLAM 1 MG PO TABS: 1.0000 mg | ORAL_TABLET | Freq: Two times a day (BID) | ORAL | 0 refills | Status: DC | PRN

## 2017-07-16 MED ORDER — TRAZODONE HCL 100 MG PO TABS: 100.0000 mg | ORAL_TABLET | Freq: Every evening | ORAL | 3 refills | Status: DC | PRN

## 2017-07-16 NOTE — Assessment & Plan Note (Signed)
Continued improvement! Stop glipizide, as she hasn't been taking it. Continue efforts for healthier eating choices and regular exercise.

## 2017-07-16 NOTE — Assessment & Plan Note (Signed)
Await labs. Adjust regimen as indicated by results.  

## 2017-07-16 NOTE — Assessment & Plan Note (Signed)
Controlled, despite being out of valsartan due to the recent recall.  Start olmesartan 20 mg daily. Monitor BP at home.
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
## 2017-07-16 NOTE — Patient Instructions (Addendum)
Stop the glipizide. Keep a supply, in case you see a rise in your home sugars.  Start the olmesartan, to replace the valsartan.    IF you received an x-ray today, you will receive an invoice from [facility]. Please contact [facility] at [phone] with questions or concerns regarding your invoice.   IF you received labwork today, you will receive an invoice from [facility]. Please contact [facility] at [phone] with questions or concerns regarding your invoice.   Our billing staff will not be able to assist you with questions regarding bills from these companies.  You will be contacted with the lab results as soon as they are available. The fastest way to get your results is to activate your My Chart account. Instructions are located on the last page of this paperwork. If you have not heard from us regarding the results in 2 weeks, please contact this office.
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
## 2017-07-17 LAB — CBC WITH DIFFERENTIAL/PLATELET
BASOS: 0 %
Basophils Absolute: 0 10*3/uL (ref 0.0–0.2)
EOS (ABSOLUTE): 0.1 10*3/uL (ref 0.0–0.4)
Eos: 2 %
Hematocrit: 46.4 % (ref 34.0–46.6)
Hemoglobin: 15.2 g/dL (ref 11.1–15.9)
Immature Grans (Abs): 0 10*3/uL (ref 0.0–0.1)
Immature Granulocytes: 0 %
Lymphocytes Absolute: 2 10*3/uL (ref 0.7–3.1)
Lymphs: 24 %
MCH: 29.7 pg (ref 26.6–33.0)
MCHC: 32.8 g/dL (ref 31.5–35.7)
MCV: 91 fL (ref 79–97)
MONOS ABS: 0.8 10*3/uL (ref 0.1–0.9)
Monocytes: 9 %
NEUTROS ABS: 5.5 10*3/uL (ref 1.4–7.0)
Neutrophils: 65 %
PLATELETS: 282 10*3/uL (ref 150–379)
RBC: 5.12 x10E6/uL (ref 3.77–5.28)
RDW: 14.5 % (ref 12.3–15.4)
WBC: 8.5 10*3/uL (ref 3.4–10.8)

## 2017-07-17 LAB — TSH: TSH: 0.49 u[IU]/mL (ref 0.450–4.500)

## 2017-07-17 LAB — COMPREHENSIVE METABOLIC PANEL
A/G RATIO: 2.1 (ref 1.2–2.2)
ALK PHOS: 113 IU/L (ref 39–117)
ALT: 114 IU/L — AB (ref 0–32)
AST: 49 IU/L — AB (ref 0–40)
Albumin: 4.9 g/dL (ref 3.5–5.5)
BILIRUBIN TOTAL: 0.4 mg/dL (ref 0.0–1.2)
BUN/Creatinine Ratio: 29 — ABNORMAL HIGH (ref 9–23)
BUN: 20 mg/dL (ref 6–24)
CHLORIDE: 96 mmol/L (ref 96–106)
CO2: 23 mmol/L (ref 20–29)
Calcium: 9.6 mg/dL (ref 8.7–10.2)
Creatinine, Ser: 0.7 mg/dL (ref 0.57–1.00)
GFR calc non Af Amer: 102 mL/min/{1.73_m2} (ref >59)
GFR, EST AFRICAN AMERICAN: 118 mL/min/{1.73_m2} (ref >59)
Globulin, Total: 2.3 g/dL (ref 1.5–4.5)
Glucose: 175 mg/dL — ABNORMAL HIGH (ref 65–99)
POTASSIUM: 4.7 mmol/L (ref 3.5–5.2)
Sodium: 139 mmol/L (ref 134–144)
Total Protein: 7.2 g/dL (ref 6.0–8.5)

## 2017-07-17 LAB — LIPID PANEL
CHOLESTEROL TOTAL: 215 mg/dL — AB (ref 100–199)
Chol/HDL Ratio: 4.9 ratio — ABNORMAL HIGH (ref 0.0–4.4)
HDL: 44 mg/dL (ref >39)
LDL CALC: 110 mg/dL — AB (ref 0–99)
Triglycerides: 307 mg/dL — ABNORMAL HIGH (ref 0–149)
VLDL Cholesterol Cal: 61 mg/dL — ABNORMAL HIGH (ref 5–40)

## 2017-08-12 ENCOUNTER — Telehealth: Payer: Self-pay | Admitting: *Deleted

## 2017-08-12 NOTE — Telephone Encounter (Signed)
PRESCRIPTION FOR XANAX NEVER PICKED UP SHREDDED

## 2017-08-14 ENCOUNTER — Other Ambulatory Visit: Payer: Self-pay | Admitting: Physician Assistant

## 2017-08-14 DIAGNOSIS — E669 Obesity, unspecified: Secondary | ICD-10-CM

## 2017-08-14 DIAGNOSIS — E1169 Type 2 diabetes mellitus with other specified complication: Secondary | ICD-10-CM

## 2017-08-20 ENCOUNTER — Encounter: Payer: Self-pay | Admitting: Physician Assistant

## 2017-08-20 ENCOUNTER — Other Ambulatory Visit: Payer: Self-pay | Admitting: Physician Assistant

## 2017-08-20 DIAGNOSIS — L989 Disorder of the skin and subcutaneous tissue, unspecified: Secondary | ICD-10-CM

## 2017-08-21 LAB — HM DIABETES EYE EXAM

## 2017-08-22 NOTE — Telephone Encounter (Signed)
Patient seen 07/16/2017, due for follow up 10/2017. Would you authorize a refill at this time? Please advise, thank you/ S.Jonn Chaikin,CMA

## 2017-08-23 NOTE — Telephone Encounter (Signed)
Meds ordered this encounter  Medications  . buPROPion (WELLBUTRIN SR) 100 MG 12 hr tablet    Sig: TAKE 2 TABLETS(200 MG) BY MOUTH TWICE DAILY    Dispense:  180 tablet    Refill:  3

## 2017-08-26 ENCOUNTER — Telehealth (INDEPENDENT_AMBULATORY_CARE_PROVIDER_SITE_OTHER): Payer: Self-pay | Admitting: Orthopedic Surgery

## 2017-08-26 NOTE — Telephone Encounter (Signed)
[name] called and left a message on my voicemail stating that she would like to schedule CTR.  She was last seen in July.  Can you please fill out a surgery order and I will call her to schedule.

## 2017-08-26 NOTE — Telephone Encounter (Signed)
Blue sheet done thanks 

## 2017-08-28 ENCOUNTER — Telehealth (INDEPENDENT_AMBULATORY_CARE_PROVIDER_SITE_OTHER): Payer: Self-pay | Admitting: Orthopedic Surgery

## 2017-08-28 NOTE — Telephone Encounter (Signed)
I called Jessica Knox.  Left a message on her voicemail to please return my call to discuss surgery scheduling.

## 2017-09-01 ENCOUNTER — Other Ambulatory Visit: Payer: Self-pay | Admitting: Physician Assistant

## 2017-09-06 ENCOUNTER — Other Ambulatory Visit: Payer: Self-pay | Admitting: Physician Assistant

## 2017-09-06 ENCOUNTER — Telehealth: Payer: Self-pay

## 2017-09-06 DIAGNOSIS — E669 Obesity, unspecified: Secondary | ICD-10-CM

## 2017-09-06 DIAGNOSIS — E1169 Type 2 diabetes mellitus with other specified complication: Secondary | ICD-10-CM

## 2017-09-06 NOTE — Telephone Encounter (Signed)
Meds ordered this encounter  Medications  . ALPRAZolam (XANAX) 0.5 MG tablet    Sig: TAKE 1 TABLET BY MOUTH TWICE DAILY AS NEEDED FOR ANXIETY    Dispense:  60 tablet    Refill:  0  . TRULICITY 0.75 MG/0.5ML SOPN    Sig: INJECT 0.75 MG UNDER THE SKIN ONCE A WEEK    Dispense:  2 mL    Refill:  0  . levothyroxine (SYNTHROID, LEVOTHROID) 175 MCG tablet    Sig: TAKE 1 TABLET BY MOUTH DAILY BEFORE BREAKFAST    Dispense:  30 tablet    Refill:  0

## 2017-09-09 DIAGNOSIS — G5601 Carpal tunnel syndrome, right upper limb: Secondary | ICD-10-CM | POA: Diagnosis not present

## 2017-09-15 ENCOUNTER — Other Ambulatory Visit (INDEPENDENT_AMBULATORY_CARE_PROVIDER_SITE_OTHER): Payer: Self-pay | Admitting: Orthopaedic Surgery

## 2017-09-16 NOTE — Telephone Encounter (Signed)
Ok for refill? 

## 2017-09-16 NOTE — Telephone Encounter (Signed)
Ok thanks, when u call her let her know cheryl is trying to reach her about surgery scheduling thanks

## 2017-09-17 ENCOUNTER — Encounter (INDEPENDENT_AMBULATORY_CARE_PROVIDER_SITE_OTHER): Payer: Self-pay | Admitting: Orthopaedic Surgery

## 2017-09-17 ENCOUNTER — Ambulatory Visit (INDEPENDENT_AMBULATORY_CARE_PROVIDER_SITE_OTHER): Payer: BLUE CROSS/BLUE SHIELD | Admitting: Orthopaedic Surgery

## 2017-09-17 VITALS — BP 122/79 | HR 91

## 2017-09-17 DIAGNOSIS — R945 Abnormal results of liver function studies: Secondary | ICD-10-CM

## 2017-09-17 DIAGNOSIS — M25531 Pain in right wrist: Secondary | ICD-10-CM | POA: Diagnosis not present

## 2017-09-17 DIAGNOSIS — R7989 Other specified abnormal findings of blood chemistry: Secondary | ICD-10-CM

## 2017-09-17 DIAGNOSIS — Z9889 Other specified postprocedural states: Secondary | ICD-10-CM

## 2017-09-17 MED ORDER — HYDROCODONE-ACETAMINOPHEN 5-325 MG PO TABS: 1.0000 | ORAL_TABLET | Freq: Three times a day (TID) | ORAL | 0 refills | Status: DC | PRN

## 2017-09-17 NOTE — Progress Notes (Signed)
Post-Op Visit Note   Patient: Jessica Knox           Date of Birth: 1965/12/21           MRN: [id number] Visit Date: 09/17/2017 PCP: Porfirio OarJeffery, Chelle, PA-C   Assessment & Plan:  Chief Complaint:  Chief Complaint  Patient presents with  . Right Wrist - Routine Post Op  Patient will week status post right carpal tunnel release returns.. Doing well.  Visit Diagnoses:  1. Status post carpal tunnel release   2. Abnormal LFTs     Plan: Patient follow the office in 1 week for wound check and possible suture removal. Patient does have history of abnormal LFTs and has some concerns about taking Norco due to the Tylenol that is in it. Maryclare [place]. We'll get stat cmet and compare labs to what was drawn September 2018. Patient given a removable wrist splint. We'll work gentle range of motion of her fingers and wrist. Nothing too aggressive. Elevate hand as much as possible to help decrease swelling. Do not apply any creams or ointments to her incision.  Follow-Up Instructions: No Follow-up on file.   Orders:  Orders Placed This Encounter  Procedures  . Comprehensive Metabolic Panel (CMET)   Meds ordered this encounter  Medications  . HYDROcodone-acetaminophen (NORCO/VICODIN) 5-325 MG tablet    Sig: Take 1 tablet every 8 (eight) hours as needed by mouth for moderate pain.    Dispense:  40 tablet    Refill:  0    Imaging: No results found.  PMFS History: Patient Active Problem List   Diagnosis Date Noted  . Foot drop, right foot 05/21/2017  . Numbness of right hand 04/04/2017  . Estrogen deficiency 01/22/2017  . Leg swelling   . Transaminitis 09/21/2016  . Hypothyroidism 07/30/2016  . Essential hypertension 07/30/2016  . Diabetes mellitus type 2 in obese ([facility]) 07/30/2016  . Adjustment disorder with mixed anxiety and depressed mood 07/30/2016  . History of colonic polyps 07/30/2016  . Former smoker 01/26/2015  . BMI 32.0-32.9,adult 01/26/2015  . Stress incontinence 01/26/2015  .  History of pancreatitis 04/24/2013   Past Medical History:  Diagnosis Date  . Diabetes mellitus without complication ([facility])   . GERD (gastroesophageal reflux disease)   . Hypertension   . Non-traumatic rhabdomyolysis   . SVT (supraventricular tachycardia) ([facility])   . Thyroid disease    multinodular thyroid total thyroidectomy  . 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 Arthritis Father   . COPD Father   . Diabetes Father   . Hypertension Father   . Heart failure Father        valvular disease  . Heart disease Father        4V CABG  . Heart attack Father   . Alcohol abuse Father     Past Surgical History:  Procedure Laterality Date  . ABDOMINAL HYSTERECTOMY    . CHOLECYSTECTOMY    . RIGHT OOPHORECTOMY  12/06/2012  . RIGHT OOPHORECTOMY Right   . ROBOTIC ASSISTED DIAGNOSTIC LAPAROSCOPY  01/10/2015  . THYROIDECTOMY     Social History   Occupational History  . 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 Smoking status: Former Smoker    Packs/day: 1.00    Years: 30.00    Pack years: 30.00    Types: Cigarettes    Last attempt to quit: 08/02/2016  Years since quitting: 1.1  . Smokeless tobacco: Never Used  Substance and Sexual Activity  . Alcohol use: Yes    Alcohol/week: 0.0 oz  . Drug use: No  . Sexual activity: Not on file   Exam Wound looks good. Sutures intact. No drainage or signs of infection. Moving fingers well.

## 2017-09-19 LAB — COMPREHENSIVE METABOLIC PANEL
AG Ratio: 1.7 (calc) (ref 1.0–2.5)
ALT: 65 U/L — ABNORMAL HIGH (ref 6–29)
AST: 32 U/L (ref 10–35)
Albumin: 4.6 g/dL (ref 3.6–5.1)
Alkaline phosphatase (APISO): 103 U/L (ref 33–115)
BILIRUBIN TOTAL: 0.4 mg/dL (ref 0.2–1.2)
BUN: 22 mg/dL (ref 7–25)
CO2: 25 mmol/L (ref 20–32)
CREATININE: 0.69 mg/dL (ref 0.50–1.10)
Calcium: 9.3 mg/dL (ref 8.6–10.2)
Chloride: 96 mmol/L — ABNORMAL LOW (ref 98–110)
Globulin: 2.7 g/dL (calc) (ref 1.9–3.7)
Glucose, Bld: 150 mg/dL — ABNORMAL HIGH (ref 65–99)
POTASSIUM: 4.7 mmol/L (ref 3.5–5.3)
Sodium: 140 mmol/L (ref 135–146)
TOTAL PROTEIN: 7.3 g/dL (ref 6.1–8.1)

## 2017-09-19 LAB — SPECIMEN COMPROMISED

## 2017-09-19 LAB — EXTRA LAV TOP TUBE

## 2017-09-20 NOTE — Telephone Encounter (Signed)
Called to pharmacy 

## 2017-09-24 ENCOUNTER — Ambulatory Visit (INDEPENDENT_AMBULATORY_CARE_PROVIDER_SITE_OTHER): Payer: BLUE CROSS/BLUE SHIELD | Admitting: Orthopaedic Surgery

## 2017-09-24 ENCOUNTER — Encounter (INDEPENDENT_AMBULATORY_CARE_PROVIDER_SITE_OTHER): Payer: Self-pay | Admitting: Orthopaedic Surgery

## 2017-09-24 VITALS — BP 133/82 | HR 89 | Ht 59.5 in | Wt 162.0 lb

## 2017-09-24 DIAGNOSIS — Z9889 Other specified postprocedural states: Secondary | ICD-10-CM

## 2017-09-24 NOTE — Progress Notes (Signed)
Post-Op Visit Note   Patient: Jessica Knox           Date of Birth: 1966-03-31           MRN: [id number] Visit Date: 09/24/2017 PCP: Porfirio OarJeffery, Chelle, PA-C   Assessment & Plan: Follow-up right carpal tunnel release.  Sutures are removed today.  Incision looks good she is noticed improvement in her preop pain.  She has about her lumbar MRI scan which we reviewed with her today which shows some central disc protrusion at L4-5 and L5-S1 with disc desiccation.  We discussed and outlined plan for some mild dieting and some walking program exercise using a bike possible pool therapy and some core strengthening to help unload her back and help her symptoms.  Chief Complaint:  Chief Complaint  Patient presents with  . Right Wrist - Routine Post Op  . Lower Back - Pain   Visit Diagnoses:  1. Status post carpal tunnel release     Plan: She will work on an exercise program and losing a little bit of weight which she is already started.  Her compartment syndrome from her right leg has improved she has a little bit of tingling but does not have atrophy of the anterior compartment and has good ankle dorsiflexion strength and is walking without catching her foot.  Follow-up bowel with us as needed.  Follow-Up Instructions: No Follow-up on file.   Orders:  No orders of the defined types were placed in this encounter.  No orders of the defined types were placed in this encounter.   Imaging: No results found.  PMFS History: Patient Active Problem List   Diagnosis Date Noted  . Foot drop, right foot 05/21/2017  . Numbness of right hand 04/04/2017  . Estrogen deficiency 01/22/2017  . Leg swelling   . Transaminitis 09/21/2016  . Hypothyroidism 07/30/2016  . Essential hypertension 07/30/2016  . Diabetes mellitus type 2 in obese ([facility]) 07/30/2016  . Adjustment disorder with mixed anxiety and depressed mood 07/30/2016  . History of colonic polyps 07/30/2016  . Former smoker 01/26/2015  . BMI  32.0-32.9,adult 01/26/2015  . Stress incontinence 01/26/2015  . History of pancreatitis 04/24/2013   Past Medical History:  Diagnosis Date  . Diabetes mellitus without complication ([facility])   . GERD (gastroesophageal reflux disease)   . Hypertension   . Non-traumatic rhabdomyolysis   . SVT (supraventricular tachycardia) ([facility])   . Thyroid disease    multinodular thyroid total thyroidectomy  . 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 Arthritis Father   . COPD Father   . Diabetes Father   . Hypertension Father   . Heart failure Father        valvular disease  . Heart disease Father        4V CABG  . Heart attack Father   . Alcohol abuse Father     Past Surgical History:  Procedure Laterality Date  . ABDOMINAL HYSTERECTOMY    . CHOLECYSTECTOMY    . RIGHT OOPHORECTOMY  12/06/2012  . RIGHT OOPHORECTOMY Right   . ROBOTIC ASSISTED DIAGNOSTIC LAPAROSCOPY  01/10/2015  . THYROIDECTOMY     Social History   Occupational History  . 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 Smoking status: Former Smoker    Packs/day: 1.00    Years: 30.00    Pack years: 30.00  Types: Cigarettes    Last attempt to quit: 08/02/2016    Years since quitting: 1.1  . Smokeless tobacco: Never Used  Substance and Sexual Activity  . Alcohol use: Yes    Alcohol/week: 0.0 oz  . Drug use: No  . Sexual activity: Not on file

## 2017-09-29 ENCOUNTER — Other Ambulatory Visit: Payer: Self-pay | Admitting: Family Medicine

## 2017-09-29 ENCOUNTER — Other Ambulatory Visit: Payer: Self-pay | Admitting: Physician Assistant

## 2017-09-29 DIAGNOSIS — E669 Obesity, unspecified: Secondary | ICD-10-CM

## 2017-09-29 DIAGNOSIS — R110 Nausea: Secondary | ICD-10-CM

## 2017-09-29 DIAGNOSIS — E1169 Type 2 diabetes mellitus with other specified complication: Secondary | ICD-10-CM

## 2017-10-07 ENCOUNTER — Other Ambulatory Visit: Payer: Self-pay | Admitting: Physician Assistant

## 2017-10-09 ENCOUNTER — Other Ambulatory Visit: Payer: Self-pay | Admitting: Physician Assistant

## 2017-10-10 NOTE — Telephone Encounter (Signed)
Controlled substance 

## 2017-10-10 NOTE — Telephone Encounter (Signed)
Patient notified via My Chart.  Rx sent electronically.  Meds ordered this encounter  Medications  . ALPRAZolam (XANAX) 0.5 MG tablet    Sig: TAKE 1 TABLET BY MOUTH TWICE DAILY AS NEEDED FOR ANXIETY    Dispense:  60 tablet    Refill:  0

## 2017-10-15 ENCOUNTER — Ambulatory Visit: Payer: BLUE CROSS/BLUE SHIELD | Admitting: Physician Assistant

## 2017-10-15 ENCOUNTER — Other Ambulatory Visit: Payer: Self-pay | Admitting: Physician Assistant

## 2017-10-15 DIAGNOSIS — Z1231 Encounter for screening mammogram for malignant neoplasm of breast: Secondary | ICD-10-CM

## 2017-10-16 ENCOUNTER — Ambulatory Visit (INDEPENDENT_AMBULATORY_CARE_PROVIDER_SITE_OTHER): Payer: BLUE CROSS/BLUE SHIELD | Admitting: Physician Assistant

## 2017-10-16 ENCOUNTER — Encounter: Payer: Self-pay | Admitting: Physician Assistant

## 2017-10-16 ENCOUNTER — Other Ambulatory Visit: Payer: Self-pay

## 2017-10-16 VITALS — BP 126/76 | HR 92 | Temp 98.2°F | Resp 18 | Ht 59.5 in | Wt 160.0 lb

## 2017-10-16 DIAGNOSIS — R740 Nonspecific elevation of levels of transaminase and lactic acid dehydrogenase [LDH]: Secondary | ICD-10-CM

## 2017-10-16 DIAGNOSIS — I10 Essential (primary) hypertension: Secondary | ICD-10-CM | POA: Diagnosis not present

## 2017-10-16 DIAGNOSIS — M7989 Other specified soft tissue disorders: Secondary | ICD-10-CM

## 2017-10-16 DIAGNOSIS — E1169 Type 2 diabetes mellitus with other specified complication: Secondary | ICD-10-CM | POA: Diagnosis not present

## 2017-10-16 DIAGNOSIS — L292 Pruritus vulvae: Secondary | ICD-10-CM

## 2017-10-16 DIAGNOSIS — E039 Hypothyroidism, unspecified: Secondary | ICD-10-CM

## 2017-10-16 DIAGNOSIS — R7401 Elevation of levels of liver transaminase levels: Secondary | ICD-10-CM

## 2017-10-16 DIAGNOSIS — E669 Obesity, unspecified: Secondary | ICD-10-CM

## 2017-10-16 MED ORDER — FLUCONAZOLE 150 MG PO TABS: 150.0000 mg | ORAL_TABLET | Freq: Once | ORAL | 0 refills | Status: AC

## 2017-10-16 NOTE — Patient Instructions (Signed)
     IF you received an x-ray today, you will receive an invoice from [facility]. Please contact [facility] at [phone] with questions or concerns regarding your invoice.   IF you received labwork today, you will receive an invoice from [facility]. Please contact [facility] at [phone] with questions or concerns regarding your invoice.   Our billing staff will not be able to assist you with questions regarding bills from these companies.  You will be contacted with the lab results as soon as they are available. The fastest way to get your results is to activate your My Chart account. Instructions are located on the last page of this paperwork. If you have not heard from us regarding the results in 2 weeks, please contact this office.     

## 2017-10-16 NOTE — Progress Notes (Signed)
Patient ID: Jessica Knox, female    DOB: May 04, 1966, 51 y.o.   MRN: [id number]  PCP: Harrison Mons, PA-C  Chief Complaint  Patient presents with  . Diabetes  . Hyperlipidemia  . Follow-up  . Medication Refill    Zofran-ODT 8 MG, Pt would like to get back on Nalterxone    Subjective:   Presents for evaluation of diabetes, HTN, hyperlipidemia.  Overall, she's doing well. She's not using the diet plan that she had started, "I make no guarantees about the A1C." Indicates that she thinks that the A1C will be up again. She struggles with maintaining regular exercise and making healthy eating choices. Hasn't gotten in to MWM yet. Has not been eating as healthfully since RIGHT CTS release, with splint and limited use of her hand. In PT, progressing well.  "My body is not liking menopause. My hair is falling out. I itch from the top of my head tot he tip of my toes." Especially the vulva. No internal vaginal itching. Anti-itch spray helps, once the burning of the initial application stops. Does have some skin redness. Thought it was hair regrowth initially, but now that the hair is all grown back in and it persists, she thinks it's something else.   Review of Systems  Constitutional: Negative for activity change, appetite change, fatigue and unexpected weight change.  HENT: Negative for congestion, dental problem, ear pain, hearing loss, mouth sores, postnasal drip, rhinorrhea, sneezing, sore throat, tinnitus and trouble swallowing.   Eyes: Negative for photophobia, pain, redness and visual disturbance.  Respiratory: Negative for cough, chest tightness and shortness of breath.   Cardiovascular: Negative for chest pain, palpitations and leg swelling.  Gastrointestinal: Negative for abdominal pain, blood in stool, constipation, diarrhea, nausea and vomiting.  Endocrine: Negative for cold intolerance, heat intolerance, polydipsia, polyphagia and polyuria.  Genitourinary:  Negative for dysuria, frequency, hematuria and urgency.  Musculoskeletal: Negative for arthralgias, gait problem, myalgias and neck stiffness.  Skin: Negative for rash.       Hair loss. Vulvar itching and redness  Neurological: Negative for dizziness, speech difficulty, weakness, light-headedness, numbness and headaches.  Hematological: Negative for adenopathy.  Psychiatric/Behavioral: Negative for confusion and sleep disturbance. The patient is not nervous/anxious.        Patient Active Problem List   Diagnosis Date Noted  . Foot drop, right foot 05/21/2017  . Numbness of right hand 04/04/2017  . Estrogen deficiency 01/22/2017  . Leg swelling   . Transaminitis 09/21/2016  . Hypothyroidism 07/30/2016  . Essential hypertension 07/30/2016  . Diabetes mellitus type 2 in obese ([place]) 07/30/2016  . Adjustment disorder with mixed anxiety and depressed mood 07/30/2016  . History of colonic polyps 07/30/2016  . Former smoker 01/26/2015  . BMI 32.0-32.9,adult 01/26/2015  . Stress incontinence 01/26/2015  . History of pancreatitis 04/24/2013     Prior to Admission medications   Medication Sig Start Date End Date Taking? Authorizing Provider  ADVANCED FIBER COMPLEX PO Take 1 each by mouth 2 (two) times daily.    Yes [provider]  ALPRAZolam (XANAX) 0.5 MG tablet TAKE 1 TABLET BY MOUTH TWICE DAILY AS NEEDED FOR ANXIETY 10/10/17  Yes Naser Schuld, PA-C  Biotin w/ Vitamins C & E (HAIR/SKIN/NAILS PO) Take 1 tablet by mouth daily.   Yes [provider]  Blood Glucose Monitoring Suppl KIT Check blood sugar daily as needed 08/03/16  Yes Asako Saliba, PA-C  buPROPion (WELLBUTRIN SR) 100 MG 12 hr tablet TAKE 2  TABLETS(200 MG) BY MOUTH TWICE DAILY 08/23/17  Yes Jamarrion Budai, PA-C  dapagliflozin propanediol (FARXIGA) 10 MG TABS tablet Take 10 mg by mouth daily. 01/22/17  Yes Kandis Henry, PA-C  diltiazem (CARTIA XT) 180 MG 24 hr capsule Take 1 capsule (180 mg total) by  mouth daily. 07/15/17  Yes Genice Kimberlin, PA-C  esomeprazole (NEXIUM) 40 MG capsule TAKE 1 CAPSULE DAILY 09/02/17  Yes Marilea Gwynne, PA-C  estradiol (ESTRACE) 1 MG tablet TAKE 1 TABLET DAILY 04/25/17  Yes Adden Strout, PA-C  fluticasone (FLONASE) 50 MCG/ACT nasal spray Place 1 spray into both nostrils daily as needed for allergies or rhinitis.   Yes [provider]  glucose blood test strip Use as instructed 08/28/16  Yes Lemon Sternberg, PA-C  HYDROcodone-acetaminophen (NORCO/VICODIN) 5-325 MG tablet TK 1 TO 2 TS PO Q 8 H PRN P 09/09/17  Yes [provider]  HYDROcodone-acetaminophen (NORCO/VICODIN) 5-325 MG tablet Take 1 tablet every 8 (eight) hours as needed by mouth for moderate pain. 09/17/17  Yes Lanae Crumbly, PA-C  Ipratropium-Albuterol (COMBIVENT) 20-100 MCG/ACT AERS respimat Inhale 1 puff into the lungs every 6 (six) hours. 08/28/16  Yes Charliegh Vasudevan, PA-C  Lancets (ONETOUCH ULTRASOFT) lancets Test blood sugar daily. 08/17/16  Yes Envi Eagleson, PA-C  levothyroxine (SYNTHROID, LEVOTHROID) 175 MCG tablet Take 1 tablet (175 mcg total) by mouth daily before breakfast. Office visit needed 09/30/17  Yes Leighann Amadon, PA-C  meloxicam (MOBIC) 15 MG tablet TAKE 1 TABLET DAILY 10/07/17  Yes Colten Desroches, PA-C  metoprolol succinate (TOPROL-XL) 50 MG 24 hr tablet Take 50 mg by mouth 2 (two) times daily. Take with or immediately following a meal.   Yes [provider]  naltrexone (DEPADE) 50 MG tablet Take 1 tablet (50 mg total) by mouth daily. 07/05/17  Yes Jackolyn Geron, PA-C  nitrofurantoin (MACRODANTIN) 50 MG capsule TAKE 1 CAPSULE AT BEDTIME 03/06/17  Yes Amil Bouwman, PA-C  olmesartan (BENICAR) 20 MG tablet Take 1 tablet (20 mg total) by mouth daily. 07/16/17  Yes Jehieli Brassell, PA-C  ondansetron (ZOFRAN-ODT) 8 MG disintegrating tablet Take 1 tablet (8 mg total) by mouth every 8 (eight) hours as needed for nausea. 07/19/16  Yes Wardell Honour, MD    PROAIR HFA 108 [phone] Base) MCG/ACT inhaler USE 2 INHALATIONS EVERY 4 HOURS AS NEEDED FOR WHEEZING OR SHORTNESS OF BREATH (COUGH, SHORTNESS OF BREATH OR WHEEZING) 11/12/16  Yes Brook Mall, PA-C  sertraline (ZOLOFT) 50 MG tablet Take 1 tablet (50 mg total) by mouth daily. 07/16/17  Yes Thermon Zulauf, PA-C  traMADol (ULTRAM) 50 MG tablet TAKE 1 TABLET BY MOUTH EVERY 8 HOURS AS NEEDED FOR PAIN 09/20/17  Yes Marybelle Killings, MD  traZODone (DESYREL) 100 MG tablet Take 1 tablet (100 mg total) by mouth at bedtime as needed for sleep. 07/16/17  Yes Dsean Vantol, PA-C  TRULICITY 4.01 UU/7.2ZD SOPN INJECT 0.75 MG UNDER THE SKIN ONCE A WEEK 09/30/17  Yes Armando Lauman, PA-C  aspirin EC 81 MG tablet Take 81 mg by mouth daily.    [provider]  cetirizine (ZYRTEC) 10 MG tablet Take 10 mg by mouth daily.    [provider]  fenofibrate 160 MG tablet Take 1 tablet (160 mg total) by mouth daily. Patient not taking: Reported on 10/16/2017 05/25/17   Harrison Mons, PA-C     Allergies  Allergen Reactions  . Atorvastatin Swelling and Other (See Comments)    Edema  . Ciprofloxacin Palpitations and Other (See Comments)  Can take in spaced, small doses  . Morphine And Related Itching       Objective:  Physical Exam  Constitutional: She is oriented to person, place, and time. She appears well-developed and well-nourished. She is active and cooperative. No distress.  BP 126/76 (BP Location: Left Arm, Patient Position: Sitting, Cuff Size: Normal)   Pulse 92   Temp 98.2 F (36.8 C) (Oral)   Resp 18   Ht 4' 11.5" (1.511 m)   Wt 160 lb (72.6 kg)   SpO2 97%   BMI 31.78 kg/m   HENT:  Head: Normocephalic and atraumatic.  Right Ear: Hearing normal.  Left Ear: Hearing normal.  Eyes: Conjunctivae are normal. No scleral icterus.  Neck: Normal range of motion. Neck supple. No thyromegaly present.  Cardiovascular: Normal rate, regular rhythm and normal heart sounds.  Pulses:       Radial pulses are 2+ on the right side, and 2+ on the left side.  Pulmonary/Chest: Effort normal and breath sounds normal.  Lymphadenopathy:       Head (right side): No tonsillar, no preauricular, no posterior auricular and no occipital adenopathy present.       Head (left side): No tonsillar, no preauricular, no posterior auricular and no occipital adenopathy present.    She has no cervical adenopathy.       Right: No supraclavicular adenopathy present.       Left: No supraclavicular adenopathy present.  Neurological: She is alert and oriented to person, place, and time. No sensory deficit.  Skin: Skin is warm, dry and intact. No rash noted. No cyanosis or erythema. Nails show no clubbing.  Psychiatric: She has a normal mood and affect. Her speech is normal and behavior is normal.           Assessment & Plan:   Problem List Items Addressed This Visit    Hypothyroidism    Update TSH given increased hair loss      Relevant Orders   TSH (Completed)   Essential hypertension    Controlled. Continue current treatment.      Relevant Orders   CBC with Differential/Platelet (Completed)   Comprehensive metabolic panel (Completed)   TSH (Completed)   Diabetes mellitus type 2 in obese [facility]) - Primary    Await labs. Adjust regimen as indicated by results. Concern that vulvar itching reflects yeast due to uncontrolled glucose.      Relevant Orders   Comprehensive metabolic panel (Completed)   Hemoglobin A1c (Completed)   Lipid panel (Completed)   Transaminitis    Has been improving.      Relevant Orders   Comprehensive metabolic panel (Completed)   Leg swelling    Resolved.       Other Visit Diagnoses    Vulvar pruritus       treat with fluconazole. Await A1C. baby diaper ointment a better choice than anti-itch spray.       Return in about 3 months (around 01/14/2018) for re-evalaution of diabetes, blood pressure, weight.   Fara Chute, PA-C Primary Care at  [facility]

## 2017-10-17 ENCOUNTER — Ambulatory Visit (HOSPITAL_BASED_OUTPATIENT_CLINIC_OR_DEPARTMENT_OTHER)
Admission: RE | Admit: 2017-10-17 | Discharge: 2017-10-17 | Disposition: A | Discharge status: Home / Self Care | Payer: BLUE CROSS/BLUE SHIELD | Source: Ambulatory Visit | Attending: Physician Assistant | Admitting: Physician Assistant

## 2017-10-17 ENCOUNTER — Encounter (HOSPITAL_BASED_OUTPATIENT_CLINIC_OR_DEPARTMENT_OTHER): Payer: Self-pay

## 2017-10-17 DIAGNOSIS — Z1231 Encounter for screening mammogram for malignant neoplasm of breast: Secondary | ICD-10-CM | POA: Diagnosis not present

## 2017-10-17 LAB — COMPREHENSIVE METABOLIC PANEL
ALBUMIN: 4.7 g/dL (ref 3.5–5.5)
ALT: 31 IU/L (ref 0–32)
AST: 26 IU/L (ref 0–40)
Albumin/Globulin Ratio: 1.9 (ref 1.2–2.2)
Alkaline Phosphatase: 83 IU/L (ref 39–117)
BUN/Creatinine Ratio: 35 — ABNORMAL HIGH (ref 9–23)
BUN: 22 mg/dL (ref 6–24)
Bilirubin Total: 0.3 mg/dL (ref 0.0–1.2)
CHLORIDE: 99 mmol/L (ref 96–106)
CO2: 24 mmol/L (ref 20–29)
Calcium: 9.4 mg/dL (ref 8.7–10.2)
Creatinine, Ser: 0.63 mg/dL (ref 0.57–1.00)
GFR, EST AFRICAN AMERICAN: 122 mL/min/{1.73_m2} (ref >59)
GFR, EST NON AFRICAN AMERICAN: 106 mL/min/{1.73_m2} (ref >59)
GLUCOSE: 137 mg/dL — AB (ref 65–99)
Globulin, Total: 2.5 g/dL (ref 1.5–4.5)
Potassium: 4.8 mmol/L (ref 3.5–5.2)
Sodium: 141 mmol/L (ref 134–144)
TOTAL PROTEIN: 7.2 g/dL (ref 6.0–8.5)

## 2017-10-17 LAB — CBC WITH DIFFERENTIAL/PLATELET
BASOS ABS: 0 10*3/uL (ref 0.0–0.2)
Basos: 0 %
EOS (ABSOLUTE): 0.2 10*3/uL (ref 0.0–0.4)
Eos: 2 %
HEMOGLOBIN: 16.1 g/dL — AB (ref 11.1–15.9)
Hematocrit: 46.1 % (ref 34.0–46.6)
IMMATURE GRANS (ABS): 0.1 10*3/uL (ref 0.0–0.1)
IMMATURE GRANULOCYTES: 1 %
LYMPHS: 26 %
Lymphocytes Absolute: 2.7 10*3/uL (ref 0.7–3.1)
MCH: 30.9 pg (ref 26.6–33.0)
MCHC: 34.9 g/dL (ref 31.5–35.7)
MCV: 89 fL (ref 79–97)
MONOCYTES: 6 %
Monocytes Absolute: 0.6 10*3/uL (ref 0.1–0.9)
NEUTROS PCT: 65 %
Neutrophils Absolute: 6.7 10*3/uL (ref 1.4–7.0)
PLATELETS: 307 10*3/uL (ref 150–379)
RBC: 5.21 x10E6/uL (ref 3.77–5.28)
RDW: 13.1 % (ref 12.3–15.4)
WBC: 10.3 10*3/uL (ref 3.4–10.8)

## 2017-10-17 LAB — LIPID PANEL
CHOLESTEROL TOTAL: 228 mg/dL — AB (ref 100–199)
Chol/HDL Ratio: 5.1 ratio — ABNORMAL HIGH (ref 0.0–4.4)
HDL: 45 mg/dL (ref >39)
LDL CALC: 137 mg/dL — AB (ref 0–99)
TRIGLYCERIDES: 232 mg/dL — AB (ref 0–149)
VLDL CHOLESTEROL CAL: 46 mg/dL — AB (ref 5–40)

## 2017-10-17 LAB — HEMOGLOBIN A1C
Est. average glucose Bld gHb Est-mCnc: 148 mg/dL
Hgb A1c MFr Bld: 6.8 % — ABNORMAL HIGH (ref 4.8–5.6)

## 2017-10-17 LAB — TSH: TSH: 0.512 u[IU]/mL (ref 0.450–4.500)

## 2017-10-17 NOTE — Assessment & Plan Note (Signed)
Update TSH given increased hair loss

## 2017-10-17 NOTE — Assessment & Plan Note (Signed)
Controlled. Continue current treatment. 

## 2017-10-17 NOTE — Assessment & Plan Note (Signed)
Await labs. Adjust regimen as indicated by results. Concern that vulvar itching reflects yeast due to uncontrolled glucose.

## 2017-10-17 NOTE — Assessment & Plan Note (Signed)
Has been improving

## 2017-10-17 NOTE — Assessment & Plan Note (Signed)
Resolved

## 2017-10-21 ENCOUNTER — Other Ambulatory Visit (INDEPENDENT_AMBULATORY_CARE_PROVIDER_SITE_OTHER): Payer: Self-pay | Admitting: Orthopaedic Surgery

## 2017-10-22 NOTE — Telephone Encounter (Signed)
Time to stop ?

## 2017-10-22 NOTE — Telephone Encounter (Signed)
Ok for refill? 

## 2017-10-23 ENCOUNTER — Other Ambulatory Visit: Payer: Self-pay | Admitting: Physician Assistant

## 2017-10-23 ENCOUNTER — Encounter: Payer: Self-pay | Admitting: Physician Assistant

## 2017-10-23 DIAGNOSIS — E669 Obesity, unspecified: Secondary | ICD-10-CM

## 2017-10-23 DIAGNOSIS — E1169 Type 2 diabetes mellitus with other specified complication: Secondary | ICD-10-CM

## 2017-10-24 MED ORDER — TRAMADOL HCL 50 MG PO TABS: ORAL_TABLET | ORAL | 0 refills | Status: DC

## 2017-10-26 ENCOUNTER — Other Ambulatory Visit: Payer: Self-pay | Admitting: Physician Assistant

## 2017-10-31 NOTE — Telephone Encounter (Signed)
Error-Close Encounter 

## 2017-11-01 ENCOUNTER — Encounter: Payer: Self-pay | Admitting: Physician Assistant

## 2017-11-01 ENCOUNTER — Other Ambulatory Visit: Payer: Self-pay | Admitting: Physician Assistant

## 2017-11-01 NOTE — Progress Notes (Signed)
Office Visit Note   Patient: Jessica Knox           Date of Birth: [birth year]           MRN: [id number] Visit Date: 03/13/2017              Requested by: Harrison Mons, PA-C [address] [place], [place] [address] PCP: Harrison Mons, PA-C   Assessment & Plan: Visit Diagnoses:  1. Low back pain, unspecified back pain laterality, unspecified chronicity, with sciatica presence unspecified   2. Pain in joint involving right ankle and foot   3. Lumbar radiculopathy, right   4. Plantar fasciitis of right foot     Plan: With patient's ongoing lumbar radiculopathy we'll schedule MRI to rule out HNP/stenosis. Follow-up with Dr. Lorin Mercy after completion to discuss results and further treatment options. The patient's other complaints will get CBC and arthritis panel. Patient will do stretching exercises for her right plantar fasciitis. Consider injection there if that continues to be a problem.  Follow-Up Instructions: Return in about 3 weeks (around 04/03/2017) for Review lumbar MRI and labs with Dr. Lorin Mercy.   Orders:  Orders Placed This Encounter  Procedures  . XR Lumbar Spine 2-3 Views  . XR Foot 2 Views Right  . XR Ankle 2 Views Right  . MR Lumbar Spine w/o contrast  . Uric acid  . CBC with Differential/Platelet  . Sed Rate (ESR)  . CRP High sensitivity   Meds ordered this encounter  Medications  . DISCONTD: traMADol (ULTRAM) 50 MG tablet    Sig: Take 1 tablet (50 mg total) by mouth every 6 (six) hours as needed.    Dispense:  30 tablet    Refill:  0  . ketorolac (TORADOL) injection 30 mg      Procedures: No procedures performed   Clinical Data: No additional findings.   Subjective: Chief Complaint  Patient presents with  . Right Leg - Pain, Follow-up    HPI Patient comes in today with multiple complaints. Planes of low back pain and right leg pain and right ankle pain, right heel pain.  She's had these ongoing problems for a while. Has taken  over-the-counter medication without relief. pain right ankle and heel and she is ablating. Low back symptoms aggravated with bending twisting lifting and prolonged sitting. Review of Systems  no current cardiac pulmonary GI GU issues  Objective: Vital Signs: There were no vitals taken for this visit.  Physical Exam  Constitutional: She is oriented to person, place, and time. No distress.  HENT:  Head: Normocephalic and atraumatic.  Eyes: EOM are normal. Pupils are equal, round, and reactive to light.  Neck: Normal range of motion.  Pulmonary/Chest: No respiratory distress.  Musculoskeletal:  Gait is antalgic. Lumbar paraspinal tenderness. Some discomfort with right straight leg raise. Negative logroll bilateral hips. Bilateral knees unremarkable. Right ankle is tender across the anterior tibiotalar joint line. Ligaments are stable. Right heel tender at the plantar fascial calcaneal insertion point. No swelling or bruising. Neurovascularly intact.  Neurological: She is alert and oriented to person, place, and time.  Skin: Skin is warm and dry.  Psychiatric: She has a normal mood and affect.    Ortho Exam  Specialty Comments:  No specialty comments available.  Imaging: No results found.   PMFS History: Patient Active Problem List   Diagnosis Date Noted  . Foot drop, right foot 05/21/2017  . Numbness of right hand 04/04/2017  . Estrogen deficiency 01/22/2017  .  Transaminitis 09/21/2016  . Hypothyroidism 07/30/2016  . Essential hypertension 07/30/2016  . Diabetes mellitus type 2 in obese ([place]) 07/30/2016  . Adjustment disorder with mixed anxiety and depressed mood 07/30/2016  . History of colonic polyps 07/30/2016  . Former smoker 01/26/2015  . BMI 32.0-32.9,adult 01/26/2015  . Stress incontinence 01/26/2015  . History of pancreatitis 04/24/2013   Past Medical History:  Diagnosis Date  . Diabetes mellitus without complication ([place])   . GERD (gastroesophageal reflux  disease)   . Hypertension   . Non-traumatic rhabdomyolysis   . SVT (supraventricular tachycardia) ([place])   . Thyroid disease    multinodular thyroid total thyroidectomy  . 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 Arthritis Father   . COPD Father   . Diabetes Father   . Hypertension Father   . Heart failure Father        valvular disease  . Heart disease Father        4V CABG  . Heart attack Father   . Alcohol abuse Father     Past Surgical History:  Procedure Laterality Date  . ABDOMINAL HYSTERECTOMY    . CHOLECYSTECTOMY    . REDUCTION MAMMAPLASTY    . RIGHT OOPHORECTOMY  12/06/2012  . RIGHT OOPHORECTOMY Right   . ROBOTIC ASSISTED DIAGNOSTIC LAPAROSCOPY  01/10/2015  . THYROIDECTOMY     Social History   Occupational History  . Occupation: unemployed    Comment: Therapist, sports  Tobacco Use  . Smoking status: Former Smoker    Packs/day: 1.00    Years: 30.00    Pack years: 30.00    Types: Cigarettes    Last attempt to quit: 08/02/2016    Years since quitting: 1.2  . Smokeless tobacco: Never Used  Substance and Sexual Activity  . Alcohol use: Yes    Alcohol/week: 0.0 oz  . Drug use: No  . Sexual activity: Not on file

## 2017-11-03 MED ORDER — NITROFURANTOIN MACROCRYSTAL 50 MG PO CAPS: 50.0000 mg | ORAL_CAPSULE | Freq: Every day | ORAL | 3 refills | Status: DC

## 2017-11-03 NOTE — Telephone Encounter (Signed)
Patient notified via My Chart.  Meds ordered this encounter  Medications  . nitrofurantoin (MACRODANTIN) 50 MG capsule    Sig: Take 1 capsule (50 mg total) by mouth at bedtime.    Dispense:  90 capsule    Refill:  3    Order Specific Question:   Supervising Provider    Answer:   Clelia CroftSHAW, EVA N [[id number]]

## 2017-11-08 ENCOUNTER — Encounter: Payer: Self-pay | Admitting: Physician Assistant

## 2017-11-08 DIAGNOSIS — E2839 Other primary ovarian failure: Secondary | ICD-10-CM

## 2017-11-11 MED ORDER — ESTRADIOL 1 MG PO TABS: 1.0000 mg | ORAL_TABLET | Freq: Every day | ORAL | 1 refills | Status: AC

## 2017-11-11 NOTE — Telephone Encounter (Signed)
Phone call to [facility] on [address] Pharmacy staff states they have not received prescription for estradiol.   Provider, patient should have two refills left. Prescription pended for your signature.

## 2017-11-11 NOTE — Telephone Encounter (Signed)
Patient notified via My Chart.  Meds ordered this encounter  Medications  . estradiol (ESTRACE) 1 MG tablet    Sig: Take 1 tablet (1 mg total) by mouth daily.    Dispense:  90 tablet    Refill:  1

## 2017-11-27 ENCOUNTER — Other Ambulatory Visit: Payer: Self-pay | Admitting: Physician Assistant

## 2017-11-27 DIAGNOSIS — F4323 Adjustment disorder with mixed anxiety and depressed mood: Secondary | ICD-10-CM

## 2017-11-27 NOTE — Telephone Encounter (Signed)
Last refill was via E mail request on 07/02/17 by Dr. Clelia CroftShaw.   It had 30 tabs for 3 refills.   All the refills have been used.  I don't see a follow up appt scheduled.

## 2017-11-29 ENCOUNTER — Telehealth: Payer: Self-pay

## 2017-11-29 NOTE — Telephone Encounter (Signed)
[facility]  Patient's plan does not require PA for qty 180 for 90 days, but it does have to be sent through the mail order pharmacy.

## 2017-11-29 NOTE — Telephone Encounter (Signed)
For what medication? 

## 2017-12-02 ENCOUNTER — Other Ambulatory Visit: Payer: Self-pay | Admitting: Physician Assistant

## 2017-12-02 MED ORDER — BUPROPION HCL ER (SR) 100 MG PO TB12: ORAL_TABLET | ORAL | 3 refills | Status: DC

## 2017-12-02 NOTE — Telephone Encounter (Signed)
Meds ordered this encounter  Medications  . buPROPion (WELLBUTRIN SR) 100 MG 12 hr tablet    Sig: TAKE 2 TABLETS(200 MG) BY MOUTH TWICE DAILY    Dispense:  360 tablet    Refill:  3    Order Specific Question:   Supervising Provider    Answer:   Clelia CroftSHAW, EVA N [[id number]]   Sent to Mail Order

## 2017-12-02 NOTE — Telephone Encounter (Signed)
LMVM notifying patient. 

## 2017-12-02 NOTE — Addendum Note (Signed)
Addended by: Porfirio OarJEFFERY, Albina Gosney S on: 12/02/2017 11:00 AM   Modules accepted: Orders

## 2017-12-02 NOTE — Telephone Encounter (Signed)
So sorry, Chelle! Medication is bupropion Sr 100 mg tablets.

## 2017-12-18 ENCOUNTER — Other Ambulatory Visit: Payer: Self-pay | Admitting: Physician Assistant

## 2017-12-18 NOTE — Telephone Encounter (Signed)
Xanax refill request controlled substance Last OV 10/16/17 [id number] [place] 

## 2017-12-20 NOTE — Telephone Encounter (Signed)
Rx sent electronically.  Meds ordered this encounter  Medications  . ALPRAZolam (XANAX) 0.5 MG tablet    Sig: TAKE 1 TABLET BY MOUTH TWICE DAILY AS NEEDED FOR ANXIETY    Dispense:  60 tablet    Refill:  0

## 2017-12-20 NOTE — Telephone Encounter (Signed)
Please advise 

## 2017-12-23 ENCOUNTER — Encounter: Payer: Self-pay | Admitting: Physician Assistant

## 2017-12-23 DIAGNOSIS — M549 Dorsalgia, unspecified: Secondary | ICD-10-CM

## 2017-12-25 MED ORDER — GABAPENTIN 100 MG PO CAPS: 100.0000 mg | ORAL_CAPSULE | Freq: Three times a day (TID) | ORAL | 3 refills | Status: DC

## 2018-01-02 ENCOUNTER — Other Ambulatory Visit: Payer: Self-pay | Admitting: Physician Assistant

## 2018-01-02 DIAGNOSIS — F4323 Adjustment disorder with mixed anxiety and depressed mood: Secondary | ICD-10-CM

## 2018-01-14 ENCOUNTER — Ambulatory Visit: Payer: BLUE CROSS/BLUE SHIELD | Admitting: Physician Assistant

## 2018-01-21 ENCOUNTER — Other Ambulatory Visit: Payer: Self-pay

## 2018-01-21 ENCOUNTER — Ambulatory Visit (INDEPENDENT_AMBULATORY_CARE_PROVIDER_SITE_OTHER): Payer: BLUE CROSS/BLUE SHIELD | Admitting: Physician Assistant

## 2018-01-21 ENCOUNTER — Encounter: Payer: Self-pay | Admitting: Physician Assistant

## 2018-01-21 VITALS — BP 112/62 | HR 95 | Temp 98.8°F | Resp 16 | Ht 59.5 in | Wt 158.4 lb

## 2018-01-21 DIAGNOSIS — E039 Hypothyroidism, unspecified: Secondary | ICD-10-CM | POA: Diagnosis not present

## 2018-01-21 DIAGNOSIS — I10 Essential (primary) hypertension: Secondary | ICD-10-CM

## 2018-01-21 DIAGNOSIS — E1169 Type 2 diabetes mellitus with other specified complication: Secondary | ICD-10-CM | POA: Diagnosis not present

## 2018-01-21 DIAGNOSIS — R110 Nausea: Secondary | ICD-10-CM | POA: Diagnosis not present

## 2018-01-21 DIAGNOSIS — E669 Obesity, unspecified: Secondary | ICD-10-CM

## 2018-01-21 MED ORDER — ONDANSETRON 8 MG PO TBDP: 8.0000 mg | ORAL_TABLET | Freq: Three times a day (TID) | ORAL | 0 refills | Status: AC | PRN

## 2018-01-21 NOTE — Patient Instructions (Signed)
     IF you received an x-ray today, you will receive an invoice from [facility]. Please contact [facility] at [phone] with questions or concerns regarding your invoice.   IF you received labwork today, you will receive an invoice from [facility]. Please contact [facility] at [phone] with questions or concerns regarding your invoice.   Our billing staff will not be able to assist you with questions regarding bills from these companies.  You will be contacted with the lab results as soon as they are available. The fastest way to get your results is to activate your My Chart account. Instructions are located on the last page of this paperwork. If you have not heard from us regarding the results in 2 weeks, please contact this office.     

## 2018-01-21 NOTE — Progress Notes (Signed)
Patient ID: Jessica Knox, female    DOB: 16-Feb-1966, 52 y.o.   MRN: [id number]  PCP: Harrison Mons, PA-C  Chief Complaint  Patient presents with  . Diabetes    follow up   . Hyperlipidemia    follow up     Subjective:   Presents for evaluation of diabetes, hyperlipidemia.  Recall that she has been working on getting control of these issues for quite some time. Initially, she really struggled with accepting the diagnoses and need for treatment at all. Then, she experienced transaminitis, LE edema resulting in compartment syndrome, likely due in part to the treatment with metformin and statin. She has since begun making substantial lifestyle changes, and we continue to see improvement in control of both issues.  Is back at work and loving it.  Continues as a non-smoker. Has quit drinking alcohol. Is fasting today for labs.  Sometimes misses a Trulicity dose, so may use it only every 10-12 days instead of every 7 days.   Review of Systems  Constitutional: Negative for activity change, appetite change, fatigue and unexpected weight change.  HENT: Negative for congestion, dental problem, ear pain, hearing loss, mouth sores, postnasal drip, rhinorrhea, sneezing, sore throat, tinnitus and trouble swallowing.   Eyes: Negative for photophobia, pain, redness and visual disturbance.  Respiratory: Negative for cough, chest tightness and shortness of breath.   Cardiovascular: Negative for chest pain, palpitations and leg swelling.  Gastrointestinal: Negative for abdominal pain, blood in stool, constipation, diarrhea, nausea and vomiting.  Endocrine: Negative for cold intolerance, heat intolerance, polydipsia, polyphagia and polyuria.  Genitourinary: Negative for dysuria, frequency, hematuria and urgency.  Musculoskeletal: Negative for arthralgias, gait problem, myalgias and neck stiffness.  Skin: Negative for rash.  Neurological: Negative for dizziness, speech difficulty,  weakness, light-headedness, numbness and headaches.  Hematological: Negative for adenopathy.  Psychiatric/Behavioral: Negative for confusion and sleep disturbance. The patient is not nervous/anxious.        Patient Active Problem List   Diagnosis Date Noted  . Foot drop, right foot 05/21/2017  . Numbness of right hand 04/04/2017  . Estrogen deficiency 01/22/2017  . Transaminitis 09/21/2016  . Hypothyroidism 07/30/2016  . Essential hypertension 07/30/2016  . Diabetes mellitus type 2 in obese ([place]) 07/30/2016  . Adjustment disorder with mixed anxiety and depressed mood 07/30/2016  . History of colonic polyps 07/30/2016  . Former smoker 01/26/2015  . BMI 32.0-32.9,adult 01/26/2015  . Stress incontinence 01/26/2015  . History of pancreatitis 04/24/2013     Prior to Admission medications   Medication Sig Start Date End Date Taking? Authorizing Provider  ADVANCED FIBER COMPLEX PO Take 1 each by mouth 2 (two) times daily.    Yes [provider]  ALPRAZolam Duanne Moron) 0.5 MG tablet TAKE 1 TABLET BY MOUTH TWICE DAILY AS NEEDED FOR ANXIETY 12/20/17  Yes Caty Tessler, PA-C  aspirin EC 81 MG tablet Take 81 mg by mouth daily.   Yes [provider]  Biotin w/ Vitamins C & E (HAIR/SKIN/NAILS PO) Take 1 tablet by mouth daily.   Yes [provider]  Blood Glucose Monitoring Suppl KIT Check blood sugar daily as needed 08/03/16  Yes Amiylah Anastos, PA-C  buPROPion (WELLBUTRIN SR) 100 MG 12 hr tablet TAKE 2 TABLETS(200 MG) BY MOUTH TWICE DAILY 12/02/17  Yes Shiraz Bastyr, PA-C  CARTIA XT 180 MG 24 hr capsule TAKE 1 CAPSULE(180 MG) BY MOUTH DAILY 01/03/18  Yes Trace Cederberg, PA-C  cetirizine (ZYRTEC) 10 MG tablet Take 10 mg  by mouth daily.   Yes [provider]  dapagliflozin propanediol (FARXIGA) 10 MG TABS tablet Take 10 mg by mouth daily. 01/22/17  Yes Filbert Craze, PA-C  esomeprazole (NEXIUM) 40 MG capsule TAKE 1 CAPSULE DAILY 12/02/17  Yes Jazilyn Siegenthaler,  PA-C  estradiol (ESTRACE) 1 MG tablet Take 1 tablet (1 mg total) by mouth daily. 11/11/17  Yes Dakia Schifano, PA-C  fenofibrate 160 MG tablet Take 1 tablet (160 mg total) by mouth daily. 05/25/17  Yes Huxley Vanwagoner, PA-C  fluticasone (FLONASE) 50 MCG/ACT nasal spray Place 1 spray into both nostrils daily as needed for allergies or rhinitis.   Yes [provider]  gabapentin (NEURONTIN) 100 MG capsule Take 1 capsule (100 mg total) by mouth 3 (three) times daily. May increase up to 300 mg TID. 12/25/17  Yes Tyrene Nader, PA-C  glucose blood test strip Use as instructed 08/28/16  Yes Autumnrose Yore, PA-C  HYDROcodone-acetaminophen (NORCO/VICODIN) 5-325 MG tablet TK 1 TO 2 TS PO Q 8 H PRN P 09/09/17  Yes [provider]  HYDROcodone-acetaminophen (NORCO/VICODIN) 5-325 MG tablet Take 1 tablet every 8 (eight) hours as needed by mouth for moderate pain. 09/17/17  Yes Lanae Crumbly, PA-C  Ipratropium-Albuterol (COMBIVENT) 20-100 MCG/ACT AERS respimat Inhale 1 puff into the lungs every 6 (six) hours. 08/28/16  Yes Felicia Bloomquist, PA-C  Lancets (ONETOUCH ULTRASOFT) lancets Test blood sugar daily. 08/17/16  Yes Mahealani Sulak, PA-C  levothyroxine (SYNTHROID, LEVOTHROID) 175 MCG tablet TAKE 1 TABLET BY MOUTH DAILY BEFORE BREAKFAST 10/28/17  Yes Hanny Elsberry, PA-C  meloxicam (MOBIC) 15 MG tablet TAKE 1 TABLET DAILY 10/07/17  Yes Bhumi Godbey, PA-C  metoprolol succinate (TOPROL-XL) 50 MG 24 hr tablet Take 50 mg by mouth 2 (two) times daily. Take with or immediately following a meal.   Yes [provider]  naltrexone (DEPADE) 50 MG tablet Take 1 tablet (50 mg total) by mouth daily. 07/05/17  Yes Charline Hoskinson, PA-C  nitrofurantoin (MACRODANTIN) 50 MG capsule Take 1 capsule (50 mg total) by mouth at bedtime. 11/03/17  Yes Shlome Baldree, PA-C  olmesartan (BENICAR) 20 MG tablet Take 1 tablet (20 mg total) by mouth daily. 07/16/17  Yes Yishai Rehfeld, PA-C  ondansetron  (ZOFRAN-ODT) 8 MG disintegrating tablet Take 1 tablet (8 mg total) by mouth every 8 (eight) hours as needed for nausea. 07/19/16  Yes Wardell Honour, MD  PROAIR HFA 108 [phone] Base) MCG/ACT inhaler USE 2 INHALATIONS EVERY 4 HOURS AS NEEDED FOR WHEEZING OR SHORTNESS OF BREATH (COUGH, SHORTNESS OF BREATH OR WHEEZING) 11/12/16  Yes Ladawna Walgren, PA-C  sertraline (ZOLOFT) 50 MG tablet TAKE 1 TABLET(50 MG) BY MOUTH DAILY 01/03/18  Yes Jasha Hodzic, PA-C  traMADol (ULTRAM) 50 MG tablet TAKE 1 TABLET BY MOUTH EVERY 8 HOURS AS NEEDED FOR PAIN 10/24/17  Yes Holdan Stucke, PA-C  traZODone (DESYREL) 100 MG tablet Take 1 tablet (100 mg total) by mouth at bedtime as needed for sleep. 07/16/17  Yes Jamelah Sitzer, PA-C  TRULICITY 9.02 IO/9.7DZ SOPN INJECT 0.75 MG UNDER THE SKIN ONCE A WEEK 10/24/17  Yes Conny Moening, PA-C     Allergies  Allergen Reactions  . Atorvastatin Swelling and Other (See Comments)    Edema  . Ciprofloxacin Palpitations and Other (See Comments)    Can take in spaced, small doses  . Morphine And Related Itching       Objective:  Physical Exam  Constitutional: She is oriented to person, place, and time. She appears well-developed and well-nourished.  She is active and cooperative. No distress.  BP 112/62   Pulse 95   Temp 98.8 F (37.1 C)   Resp 16   Ht 4' 11.5" (1.511 m)   Wt 158 lb 6.4 oz (71.8 kg)   SpO2 95%   BMI 31.46 kg/m   HENT:  Head: Normocephalic and atraumatic.  Right Ear: Hearing normal.  Left Ear: Hearing normal.  Eyes: Conjunctivae are normal. No scleral icterus.  Neck: Normal range of motion. Neck supple. No thyromegaly present.  Cardiovascular: Normal rate, regular rhythm and normal heart sounds.  Pulses:      Radial pulses are 2+ on the right side, and 2+ on the left side.  Pulmonary/Chest: Effort normal and breath sounds normal.  Lymphadenopathy:       Head (right side): No tonsillar, no preauricular, no posterior auricular and no occipital  adenopathy present.       Head (left side): No tonsillar, no preauricular, no posterior auricular and no occipital adenopathy present.    She has no cervical adenopathy.       Right: No supraclavicular adenopathy present.       Left: No supraclavicular adenopathy present.  Neurological: She is alert and oriented to person, place, and time. No sensory deficit.  Skin: Skin is warm, dry and intact. No rash noted. No cyanosis or erythema. Nails show no clubbing.  Psychiatric: She has a normal mood and affect. Her speech is normal and behavior is normal.    Wt Readings from Last 3 Encounters:  01/21/18 158 lb 6.4 oz (71.8 kg)  10/16/17 160 lb (72.6 kg)  09/24/17 162 lb (73.5 kg)      Assessment & Plan:   Problem List Items Addressed This Visit    Hypothyroidism    Has been stable. Await labs. Adjust regimen as indicated by results.      Essential hypertension    Well controlled. Continue diltiazem, olmesartan and metoprolol.      Relevant Orders   Comprehensive metabolic panel (Completed)   Diabetes mellitus type 2 in obese [facility]) - Primary    Await labs. Adjust regimen as indicated by results. Goal A1C is below 7%      Relevant Orders   Comprehensive metabolic panel (Completed)   Hemoglobin A1c (Completed)   Lipid panel (Completed)    Other Visit Diagnoses    Nausea without vomiting       Relevant Medications   ondansetron (ZOFRAN-ODT) 8 MG disintegrating tablet       Return in about 3 months (around 04/23/2018) for re-evaluation of diabetes, cholesterol.   Fara Chute, PA-C [facility]

## 2018-01-22 LAB — COMPREHENSIVE METABOLIC PANEL
ALT: 64 IU/L — AB (ref 0–32)
AST: 47 IU/L — AB (ref 0–40)
Albumin/Globulin Ratio: 1.7 (ref 1.2–2.2)
Albumin: 4.3 g/dL (ref 3.5–5.5)
Alkaline Phosphatase: 90 IU/L (ref 39–117)
BUN / CREAT RATIO: 28 — AB (ref 9–23)
BUN: 17 mg/dL (ref 6–24)
Bilirubin Total: 0.3 mg/dL (ref 0.0–1.2)
CALCIUM: 9.1 mg/dL (ref 8.7–10.2)
CHLORIDE: 98 mmol/L (ref 96–106)
CO2: 23 mmol/L (ref 20–29)
Creatinine, Ser: 0.6 mg/dL (ref 0.57–1.00)
GFR calc non Af Amer: 107 mL/min/{1.73_m2} (ref >59)
GFR, EST AFRICAN AMERICAN: 124 mL/min/{1.73_m2} (ref >59)
GLOBULIN, TOTAL: 2.6 g/dL (ref 1.5–4.5)
GLUCOSE: 119 mg/dL — AB (ref 65–99)
POTASSIUM: 4.7 mmol/L (ref 3.5–5.2)
Sodium: 139 mmol/L (ref 134–144)
Total Protein: 6.9 g/dL (ref 6.0–8.5)

## 2018-01-22 LAB — LIPID PANEL
CHOL/HDL RATIO: 4.8 ratio — AB (ref 0.0–4.4)
Cholesterol, Total: 206 mg/dL — ABNORMAL HIGH (ref 100–199)
HDL: 43 mg/dL (ref >39)
LDL CALC: 120 mg/dL — AB (ref 0–99)
Triglycerides: 217 mg/dL — ABNORMAL HIGH (ref 0–149)
VLDL Cholesterol Cal: 43 mg/dL — ABNORMAL HIGH (ref 5–40)

## 2018-01-22 LAB — HEMOGLOBIN A1C
Est. average glucose Bld gHb Est-mCnc: 154 mg/dL
Hgb A1c MFr Bld: 7 % — ABNORMAL HIGH (ref 4.8–5.6)

## 2018-01-28 ENCOUNTER — Other Ambulatory Visit: Payer: Self-pay | Admitting: Physician Assistant

## 2018-01-28 DIAGNOSIS — F4323 Adjustment disorder with mixed anxiety and depressed mood: Secondary | ICD-10-CM

## 2018-01-28 DIAGNOSIS — E1169 Type 2 diabetes mellitus with other specified complication: Secondary | ICD-10-CM

## 2018-01-28 DIAGNOSIS — E669 Obesity, unspecified: Secondary | ICD-10-CM

## 2018-01-28 NOTE — Telephone Encounter (Signed)
Patient is requesting a refill of the following medications: Requested Prescriptions   Pending Prescriptions Disp Refills  . meloxicam (MOBIC) 15 MG tablet [Pharmacy Med Name: MELOXICAM TABS 15MG ] 90 tablet 0    Sig: TAKE 1 TABLET DAILY    Date of patient request: 01/28/18 Last office visit: 01/21/18 Date of last refill: 10/07/17 Last refill amount: #90 0RF  Follow up time period per chart: 04/15/18

## 2018-01-28 NOTE — Telephone Encounter (Signed)
Refill of Mobic  LOV 01/21/18  C. Tinnie GensJeffrey  [facility] [place] [address]

## 2018-02-03 ENCOUNTER — Encounter: Payer: Self-pay | Admitting: Physician Assistant

## 2018-02-03 NOTE — Assessment & Plan Note (Signed)
Await labs. Adjust regimen as indicated by results. Goal A1C is below 7%

## 2018-02-03 NOTE — Assessment & Plan Note (Signed)
Has been stable. Await labs. Adjust regimen as indicated by results.  

## 2018-02-03 NOTE — Assessment & Plan Note (Signed)
Well controlled. Continue diltiazem, olmesartan and metoprolol.

## 2018-02-06 ENCOUNTER — Encounter: Payer: Self-pay | Admitting: Physician Assistant

## 2018-02-07 MED ORDER — CANAGLIFLOZIN 300 MG PO TABS: 300.0000 mg | ORAL_TABLET | Freq: Every day | ORAL | 3 refills | Status: DC

## 2018-03-02 ENCOUNTER — Other Ambulatory Visit: Payer: Self-pay | Admitting: Physician Assistant

## 2018-03-07 ENCOUNTER — Other Ambulatory Visit: Payer: Self-pay | Admitting: Physician Assistant

## 2018-03-07 DIAGNOSIS — F4323 Adjustment disorder with mixed anxiety and depressed mood: Secondary | ICD-10-CM

## 2018-03-27 ENCOUNTER — Encounter: Payer: Self-pay | Admitting: Family Medicine

## 2018-04-05 ENCOUNTER — Other Ambulatory Visit: Payer: Self-pay | Admitting: Physician Assistant

## 2018-04-15 ENCOUNTER — Encounter: Payer: Self-pay | Admitting: Physician Assistant

## 2018-04-15 ENCOUNTER — Ambulatory Visit (INDEPENDENT_AMBULATORY_CARE_PROVIDER_SITE_OTHER): Payer: BLUE CROSS/BLUE SHIELD | Admitting: Physician Assistant

## 2018-04-15 ENCOUNTER — Other Ambulatory Visit: Payer: Self-pay

## 2018-04-15 VITALS — BP 118/64 | HR 109 | Temp 98.8°F | Resp 16 | Ht 59.69 in | Wt 153.0 lb

## 2018-04-15 DIAGNOSIS — E669 Obesity, unspecified: Secondary | ICD-10-CM | POA: Diagnosis not present

## 2018-04-15 DIAGNOSIS — F4323 Adjustment disorder with mixed anxiety and depressed mood: Secondary | ICD-10-CM

## 2018-04-15 DIAGNOSIS — E2839 Other primary ovarian failure: Secondary | ICD-10-CM | POA: Diagnosis not present

## 2018-04-15 DIAGNOSIS — E1169 Type 2 diabetes mellitus with other specified complication: Secondary | ICD-10-CM

## 2018-04-15 DIAGNOSIS — E039 Hypothyroidism, unspecified: Secondary | ICD-10-CM | POA: Diagnosis not present

## 2018-04-15 DIAGNOSIS — I10 Essential (primary) hypertension: Secondary | ICD-10-CM

## 2018-04-15 LAB — POCT GLYCOSYLATED HEMOGLOBIN (HGB A1C): HEMOGLOBIN A1C: 7.2 % — AB (ref 4.0–5.6)

## 2018-04-15 MED ORDER — BUPROPION HCL ER (SR) 100 MG PO TB12: ORAL_TABLET | ORAL | 3 refills | Status: DC

## 2018-04-15 NOTE — Patient Instructions (Addendum)
Wt Readings from Last 3 Encounters:  04/15/18 153 lb (69.4 kg)  01/21/18 158 lb 6.4 oz (71.8 kg)  10/16/17 160 lb (72.6 kg)   Keep up the GREAT work!!!  I recommend that you resume the Trulicity. Set an alarm on your phone to remind you to take it!  It's not about being perfect all the time. It's about making good choices MOST of the time.  Go ahead and call [facility] to schedule your next visit with me there. [phone].   IF you received an x-ray today, you will receive an invoice from [facility]. Please contact [facility] at [phone] with questions or concerns regarding your invoice.   IF you received labwork today, you will receive an invoice from [facility]. Please contact [facility] at [phone] with questions or concerns regarding your invoice.   Our billing staff will not be able to assist you with questions regarding bills from these companies.  You will be contacted with the lab results as soon as they are available. The fastest way to get your results is to activate your My Chart account. Instructions are located on the last page of this paperwork. If you have not heard from us regarding the results in 2 weeks, please contact this office.

## 2018-04-15 NOTE — Progress Notes (Signed)
Patient ID: Jessica Knox, female    DOB: 06/12/1966, 52 y.o.   MRN: [id number]  PCP: Harrison Mons, PA-C  Chief Complaint  Patient presents with  . Diabetes    follow up   . Hyperlipidemia    follow up     Subjective:   Presents for evaluation of diabetes and hyperlipidemia.  In general, is doing well. Gets pain and swelling in the RIGHT ankle when the weather is rainy.  Isn't checking her home glucose or BP. Has been craving carbs, especially white potatoes. Has not been taking Trulicity, was forgetting to take it!  Review of Systems  Constitutional: Negative for activity change, appetite change, fatigue and unexpected weight change.  HENT: Negative for congestion, dental problem, ear pain, hearing loss, mouth sores, postnasal drip, rhinorrhea, sneezing, sore throat, tinnitus and trouble swallowing.   Eyes: Negative for photophobia, pain, redness and visual disturbance.  Respiratory: Negative for cough, chest tightness and shortness of breath.   Cardiovascular: Negative for chest pain, palpitations and leg swelling.  Gastrointestinal: Negative for abdominal pain, blood in stool, constipation, diarrhea, nausea and vomiting.  Endocrine: Negative for cold intolerance, heat intolerance, polydipsia, polyphagia and polyuria.  Genitourinary: Negative for dysuria, frequency, hematuria and urgency.  Musculoskeletal: Positive for arthralgias. Negative for gait problem, myalgias and neck stiffness.  Skin: Negative for rash.  Neurological: Negative for dizziness, speech difficulty, weakness, light-headedness, numbness and headaches.  Hematological: Negative for adenopathy.  Psychiatric/Behavioral: Negative for confusion and sleep disturbance. The patient is not nervous/anxious.        Patient Active Problem List   Diagnosis Date Noted  . Foot drop, right foot 05/21/2017  . Numbness of right hand 04/04/2017  . Estrogen deficiency 01/22/2017  . Hypothyroidism  07/30/2016  . Essential hypertension 07/30/2016  . Diabetes mellitus type 2 in obese ([place]) 07/30/2016  . Adjustment disorder with mixed anxiety and depressed mood 07/30/2016  . History of colonic polyps 07/30/2016  . Former smoker 01/26/2015  . BMI 32.0-32.9,adult 01/26/2015  . Stress incontinence 01/26/2015  . History of pancreatitis 04/24/2013     Prior to Admission medications   Medication Sig Start Date End Date Taking? Authorizing Provider  ADVANCED FIBER COMPLEX PO Take 1 each by mouth 2 (two) times daily.    Yes [provider]  ALPRAZolam (XANAX) 0.5 MG tablet TAKE 1 TABLET BY MOUTH TWICE DAILY AS NEEDED FOR ANXIETY 12/20/17  Yes Robbie Rideaux, PA-C  Biotin w/ Vitamins C & E (HAIR/SKIN/NAILS PO) Take 1 tablet by mouth daily.   Yes [provider]  Blood Glucose Monitoring Suppl KIT Check blood sugar daily as needed 08/03/16  Yes Maresha Anastos, PA-C  buPROPion (WELLBUTRIN SR) 100 MG 12 hr tablet TAKE 2 TABLETS(200 MG) BY MOUTH TWICE DAILY 12/02/17  Yes Ridhaan Dreibelbis, PA-C  canagliflozin (INVOKANA) 300 MG TABS tablet Take 1 tablet (300 mg total) by mouth daily before breakfast. 02/07/18  Yes Britanee Vanblarcom, PA-C  CARTIA XT 180 MG 24 hr capsule TAKE 1 CAPSULE(180 MG) BY MOUTH DAILY 04/08/18  Yes Denzal Meir, PA-C  cetirizine (ZYRTEC) 10 MG tablet Take 10 mg by mouth daily.   Yes [provider]  esomeprazole (NEXIUM) 40 MG capsule TAKE 1 CAPSULE DAILY 03/04/18  Yes Alonda Weaber, PA-C  estradiol (ESTRACE) 1 MG tablet Take 1 tablet (1 mg total) by mouth daily. 11/11/17  Yes Adelae Yodice, PA-C  fenofibrate 160 MG tablet Take 1 tablet (160 mg total) by mouth daily. 05/25/17  Yes Jacqulynn Cadet,  Jereme Loren, PA-C  fluticasone (FLONASE) 50 MCG/ACT nasal spray Place 1 spray into both nostrils daily as needed for allergies or rhinitis.   Yes [provider]  gabapentin (NEURONTIN) 100 MG capsule Take 1 capsule (100 mg total) by mouth 3 (three) times daily. May  increase up to 300 mg TID. 12/25/17  Yes Leotha Voeltz, PA-C  glucose blood test strip Use as instructed 08/28/16  Yes Omer Monter, PA-C  Ipratropium-Albuterol (COMBIVENT) 20-100 MCG/ACT AERS respimat Inhale 1 puff into the lungs every 6 (six) hours. 08/28/16  Yes Amorina Doerr, PA-C  Lancets (ONETOUCH ULTRASOFT) lancets Test blood sugar daily. 08/17/16  Yes Tramayne Sebesta, PA-C  levothyroxine (SYNTHROID, LEVOTHROID) 175 MCG tablet TAKE 1 TABLET BY MOUTH DAILY BEFORE BREAKFAST 01/28/18  Yes Kharizma Lesnick, PA-C  meloxicam (MOBIC) 15 MG tablet TAKE 1 TABLET DAILY 01/29/18  Yes Yonis Carreon, PA-C  metoprolol succinate (TOPROL-XL) 50 MG 24 hr tablet Take 50 mg by mouth 2 (two) times daily. Take with or immediately following a meal. PATIENT TAKES ONLY AT HS   Yes [provider]  nitrofurantoin (MACRODANTIN) 50 MG capsule Take 1 capsule (50 mg total) by mouth at bedtime. 11/03/17  Yes Keegen Heffern, PA-C  olmesartan (BENICAR) 20 MG tablet Take 1 tablet (20 mg total) by mouth daily. 07/16/17  Yes Drishti Pepperman, PA-C  ondansetron (ZOFRAN-ODT) 8 MG disintegrating tablet Take 1 tablet (8 mg total) by mouth every 8 (eight) hours as needed for nausea. 01/21/18  Yes Suhail Peloquin, PA-C  PROAIR HFA 108 (90 Base) MCG/ACT inhaler USE 2 INHALATIONS EVERY 4 HOURS AS NEEDED FOR WHEEZING OR SHORTNESS OF BREATH (COUGH, SHORTNESS OF BREATH OR WHEEZING) 11/12/16  Yes Kenedi Cilia, PA-C  sertraline (ZOLOFT) 50 MG tablet TAKE 1 TABLET(50 MG) BY MOUTH DAILY 03/10/18  Yes Coburn Knaus, PA-C  traMADol (ULTRAM) 50 MG tablet TAKE 1 TABLET BY MOUTH EVERY 8 HOURS AS NEEDED FOR PAIN 10/24/17  Yes Rebecca Motta, PA-C  traZODone (DESYREL) 100 MG tablet Take 1 tablet (100 mg total) by mouth at bedtime as needed for sleep. 07/16/17  Yes Elexis Pollak, PA-C  TRULICITY 3.53 IR/4.4RX SOPN INJECT 0.75 MG UNDER THE SKIN ONCE A WEEK Patient not taking: Reported on 04/15/2018 10/24/17   Harrison Mons, PA-C      Allergies  Allergen Reactions  . Atorvastatin Swelling and Other (See Comments)    Edema  . Ciprofloxacin Palpitations and Other (See Comments)    Can take in spaced, small doses  . Morphine And Related Itching       Objective:  Physical Exam  Constitutional: She is oriented to person, place, and time. She appears well-developed and well-nourished. She is active and cooperative. No distress.  BP 118/64   Pulse (!) 109   Temp 98.8 F (37.1 C)   Resp 16   Ht 4' 11.69" (1.516 m)   Wt 153 lb (69.4 kg)   SpO2 95%   BMI 30.20 kg/m   HENT:  Head: Normocephalic and atraumatic.  Right Ear: Hearing normal.  Left Ear: Hearing normal.  Eyes: Conjunctivae are normal. No scleral icterus.  Neck: Normal range of motion. Neck supple. No thyromegaly present.  Cardiovascular: Normal rate, regular rhythm and normal heart sounds.  Pulses:      Radial pulses are 2+ on the right side, and 2+ on the left side.  Pulmonary/Chest: Effort normal and breath sounds normal.  Lymphadenopathy:       Head (right side): No tonsillar, no preauricular, no posterior auricular and no  occipital adenopathy present.       Head (left side): No tonsillar, no preauricular, no posterior auricular and no occipital adenopathy present.    She has no cervical adenopathy.       Right: No supraclavicular adenopathy present.       Left: No supraclavicular adenopathy present.  Neurological: She is alert and oriented to person, place, and time. No sensory deficit.  Skin: Skin is warm, dry and intact. No rash noted. No cyanosis or erythema. Nails show no clubbing.  Psychiatric: She has a normal mood and affect. Her speech is normal and behavior is normal.   Wt Readings from Last 3 Encounters:  04/15/18 153 lb (69.4 kg)  01/21/18 158 lb 6.4 oz (71.8 kg)  10/16/17 160 lb (72.6 kg)      Assessment & Plan:   Problem List Items Addressed This Visit    Hypothyroidism    Update labs today.      Relevant Orders    TSH (Completed)   T4, free (Completed)   Estrogen deficiency    She believes her testosterone is low contributing to difficulty with mood and weight.      Relevant Orders   TestT+TestF+SHBG (Completed)   Essential hypertension    Controlled.  Continue current treatment.      Relevant Orders   CBC with Differential/Platelet (Completed)   Comprehensive metabolic panel (Completed)   Diabetes mellitus type 2 in obese [facility]) - Primary    Recommend that she resume Trulicity.  Put an alert on her phone to remind her.  Continue healthy eating and regular exercise.      Relevant Orders   Lipid panel (Completed)   Comprehensive metabolic panel (Completed)   POCT glycosylated hemoglobin (Hb A1C) (Completed)   Adjustment disorder with mixed anxiety and depressed mood    Well-controlled.  Continue bupropion SR 100 mg twice daily.      Relevant Medications   buPROPion (WELLBUTRIN SR) 100 MG 12 hr tablet       Return in about 3 months (around 07/16/2018) for re-evaluation of diabetes, cholesterol, thyroid.   Fara Chute, PA-C [facility]

## 2018-04-16 LAB — LIPID PANEL
CHOLESTEROL TOTAL: 216 mg/dL — AB (ref 100–199)
Chol/HDL Ratio: 4.4 ratio (ref 0.0–4.4)
HDL: 49 mg/dL (ref >39)
LDL Calculated: 137 mg/dL — ABNORMAL HIGH (ref 0–99)
TRIGLYCERIDES: 150 mg/dL — AB (ref 0–149)
VLDL Cholesterol Cal: 30 mg/dL (ref 5–40)

## 2018-04-16 LAB — COMPREHENSIVE METABOLIC PANEL
A/G RATIO: 2.2 (ref 1.2–2.2)
ALBUMIN: 4.8 g/dL (ref 3.5–5.5)
ALT: 64 IU/L — AB (ref 0–32)
AST: 47 IU/L — ABNORMAL HIGH (ref 0–40)
Alkaline Phosphatase: 90 IU/L (ref 39–117)
BUN/Creatinine Ratio: 25 — ABNORMAL HIGH (ref 9–23)
BUN: 14 mg/dL (ref 6–24)
Bilirubin Total: 0.5 mg/dL (ref 0.0–1.2)
CALCIUM: 9.5 mg/dL (ref 8.7–10.2)
CO2: 21 mmol/L (ref 20–29)
Chloride: 99 mmol/L (ref 96–106)
Creatinine, Ser: 0.56 mg/dL — ABNORMAL LOW (ref 0.57–1.00)
GFR, EST AFRICAN AMERICAN: 126 mL/min/{1.73_m2} (ref >59)
GFR, EST NON AFRICAN AMERICAN: 109 mL/min/{1.73_m2} (ref >59)
Globulin, Total: 2.2 g/dL (ref 1.5–4.5)
Glucose: 117 mg/dL — ABNORMAL HIGH (ref 65–99)
POTASSIUM: 4.7 mmol/L (ref 3.5–5.2)
Sodium: 141 mmol/L (ref 134–144)
TOTAL PROTEIN: 7 g/dL (ref 6.0–8.5)

## 2018-04-16 LAB — CBC WITH DIFFERENTIAL/PLATELET
BASOS: 0 %
Basophils Absolute: 0 10*3/uL (ref 0.0–0.2)
EOS (ABSOLUTE): 0.1 10*3/uL (ref 0.0–0.4)
EOS: 1 %
HEMATOCRIT: 45.6 % (ref 34.0–46.6)
HEMOGLOBIN: 15.3 g/dL (ref 11.1–15.9)
IMMATURE GRANS (ABS): 0 10*3/uL (ref 0.0–0.1)
IMMATURE GRANULOCYTES: 0 %
LYMPHS: 27 %
Lymphocytes Absolute: 2.8 10*3/uL (ref 0.7–3.1)
MCH: 30.7 pg (ref 26.6–33.0)
MCHC: 33.6 g/dL (ref 31.5–35.7)
MCV: 91 fL (ref 79–97)
MONOCYTES: 6 %
Monocytes Absolute: 0.6 10*3/uL (ref 0.1–0.9)
NEUTROS PCT: 66 %
Neutrophils Absolute: 6.8 10*3/uL (ref 1.4–7.0)
PLATELETS: 255 10*3/uL (ref 150–450)
RBC: 4.99 x10E6/uL (ref 3.77–5.28)
RDW: 13.4 % (ref 12.3–15.4)
WBC: 10.4 10*3/uL (ref 3.4–10.8)

## 2018-04-16 LAB — T4, FREE: Free T4: 1.98 ng/dL — ABNORMAL HIGH (ref 0.82–1.77)

## 2018-04-16 LAB — TSH: TSH: 0.051 u[IU]/mL — ABNORMAL LOW (ref 0.450–4.500)

## 2018-04-18 LAB — TESTT+TESTF+SHBG
Sex Hormone Binding: 77 nmol/L (ref 17.3–125.0)
TESTOSTERONE, TOTAL: 22.5 ng/dL
Testosterone, Free: 4 pg/mL (ref 0.0–4.2)

## 2018-04-18 MED ORDER — LEVOTHYROXINE SODIUM 150 MCG PO TABS: 150.0000 ug | ORAL_TABLET | Freq: Every day | ORAL | 3 refills | Status: DC

## 2018-04-18 NOTE — Assessment & Plan Note (Signed)
Recommend that she resume Trulicity.  Put an alert on her phone to remind her.  Continue healthy eating and regular exercise.

## 2018-04-18 NOTE — Assessment & Plan Note (Signed)
Controlled. Continue current treatment. 

## 2018-04-18 NOTE — Addendum Note (Signed)
Addended by: Fernande BrasJEFFERY, Iman Orourke S on: 04/18/2018 03:53 PM   Modules accepted: Orders

## 2018-04-18 NOTE — Assessment & Plan Note (Signed)
She believes her testosterone is low contributing to difficulty with mood and weight.

## 2018-04-18 NOTE — Assessment & Plan Note (Signed)
Well-controlled.  Continue bupropion SR 100 mg twice daily.

## 2018-04-18 NOTE — Assessment & Plan Note (Signed)
Update labs today 

## 2018-04-24 ENCOUNTER — Other Ambulatory Visit: Payer: Self-pay | Admitting: General Practice

## 2018-04-24 ENCOUNTER — Encounter: Payer: Self-pay | Admitting: General Practice

## 2018-04-24 DIAGNOSIS — M549 Dorsalgia, unspecified: Secondary | ICD-10-CM

## 2018-04-24 DIAGNOSIS — E039 Hypothyroidism, unspecified: Secondary | ICD-10-CM

## 2018-04-24 DIAGNOSIS — F4323 Adjustment disorder with mixed anxiety and depressed mood: Secondary | ICD-10-CM

## 2018-04-24 DIAGNOSIS — I10 Essential (primary) hypertension: Secondary | ICD-10-CM

## 2018-04-24 NOTE — Telephone Encounter (Signed)
Pt wants to change pharmacy [facility] Levothyroxine already called to local pharm Metoprolol rx by historical provider Alprazolam and zoloft - sent to [facility] to advise

## 2018-04-24 NOTE — Telephone Encounter (Signed)
Copied from CRM [id number]. Topic: Quick Communication - Rx Refill/Question >> Apr 24, 2018 11:46 AM Zada GirtLander, Lumin L wrote: Medication: metoprolol succinate (TOPROL-XL) 50 MG 24 hr tablet, levothyroxine (SYNTHROID, LEVOTHROID) 150 MCG tablet, sertraline (ZOLOFT) 50 MG tablet, ALPRAZolam (XANAX) 0.5 MG tablet  Has the patient contacted their pharmacy? Yes.   (Agent: If no, request that the patient contact the pharmacy for the refill.) (Agent: If yes, when and what did the pharmacy advise?)  Preferred Pharmacy (with phone number or street name): [facility] - [place], [place] - [address] [place] [id number] Phone: [phone] Fax: [phone]  Agent: Please be advised that RX refills may take up to 3 business days. We ask that you follow-up with your pharmacy.  Patient was seeing Theora Gianottihelle Jeffrey who is no longer at [facility].

## 2018-04-27 ENCOUNTER — Encounter (INDEPENDENT_AMBULATORY_CARE_PROVIDER_SITE_OTHER): Payer: Self-pay | Admitting: Orthopaedic Surgery

## 2018-04-29 ENCOUNTER — Other Ambulatory Visit: Payer: Self-pay | Admitting: Physician Assistant

## 2018-04-29 NOTE — Telephone Encounter (Signed)
Pt calling to check on refills for the meds listed below as well rx refills for Trulicity, Invokana, and nitrofurantoin. She will like them sent to [facility], not [facility].  [facility] [place] - [address] [phone] (Phone) [phone] (Fax)

## 2018-04-30 ENCOUNTER — Ambulatory Visit (INDEPENDENT_AMBULATORY_CARE_PROVIDER_SITE_OTHER): Payer: Self-pay

## 2018-04-30 ENCOUNTER — Encounter (INDEPENDENT_AMBULATORY_CARE_PROVIDER_SITE_OTHER): Payer: Self-pay | Admitting: Surgery

## 2018-04-30 ENCOUNTER — Ambulatory Visit (INDEPENDENT_AMBULATORY_CARE_PROVIDER_SITE_OTHER): Payer: BLUE CROSS/BLUE SHIELD | Admitting: Surgery

## 2018-04-30 DIAGNOSIS — M659 Synovitis and tenosynovitis, unspecified: Secondary | ICD-10-CM | POA: Diagnosis not present

## 2018-04-30 DIAGNOSIS — M25571 Pain in right ankle and joints of right foot: Secondary | ICD-10-CM | POA: Diagnosis not present

## 2018-04-30 MED ORDER — METHYLPREDNISOLONE ACETATE 40 MG/ML IJ SUSP
40.0000 mg | INTRAMUSCULAR | Status: AC | PRN
  Administered 2018-04-30: 40 mg via INTRA_ARTICULAR

## 2018-04-30 MED ORDER — LIDOCAINE HCL 1 % IJ SOLN
1.0000 mL | INTRAMUSCULAR | Status: AC | PRN
  Administered 2018-04-30: 1 mL

## 2018-04-30 NOTE — Progress Notes (Signed)
Office Visit Note   Patient: Jessica Knox           Date of Birth: 1965-11-12           MRN: [id number] Visit Date: 04/30/2018              Requested by: Porfirio Oar, PA-C No address on file PCP: Porfirio Oar, PA-C   Assessment & Plan: Visit Diagnoses:  1. Pain in right ankle and joints of right foot   2. Synovitis of right ankle     Plan: Advised patient that I do not think that her pain is related to rhabdomyolysis.  Offered conservative management with right ankle injection.  After patient sent right anterolateral ankle was prepped with Betadine and intra-articular Marcaine/Depo-Medrol injection was performed.  Tolerated well without complication.  After sitting for a few minutes patient reported excellent relief of her ankle pain with Marcaine in place.  Follow-up in 4 weeks with Dr. Ophelia Charter for recheck.  If ankle pain returns I think it would be reasonable to get an MRI of the ankle.  I also did recommend that patient start wearing compression stockings since her calf soreness does improve with leg elevation.  Follow-Up Instructions: Return in about 1 month (around 05/28/2018) for with Dr Ophelia Charter.   Orders:  Orders Placed This Encounter  Procedures  . XR Ankle Complete Right   No orders of the defined types were placed in this encounter.     Procedures: Medium Joint Inj: R ankle on 04/30/2018 10:10 AM Indications: pain and joint swelling Details: 25 G 1.5 in needle, anterolateral approach Medications: 1 mL lidocaine 1 %; 40 mg methylPREDNISolone acetate 40 MG/ML Outcome: tolerated well, no immediate complications Consent was given by the patient.       Clinical Data: No additional findings.   Subjective: Chief Complaint  Patient presents with  . Right Ankle - Pain    HPI Patient comes in today with complaints of right ankle pain and intermittent right calf soreness.  She has had off-and-on problems with right ankle and we have seen her for this  in the past.  Last couple weeks ankle has become more sore with pain and stiffness.  No injury.  States that she is also had some soreness in her right calf is intermittent.  This improved with leg elevation.  She had a hospital admission last year for rhabdomyolysis and she is concerned about this being the same problem.  No planes of chest pain shortness of breath.  Denies knee pain. Review of Systems No current cardiac pulmonary GI GU issues  Objective: Vital Signs: There were no vitals taken for this visit.  Physical Exam  Constitutional: She is oriented to person, place, and time. No distress.  HENT:  Head: Normocephalic.  Eyes: Pupils are equal, round, and reactive to light. EOM are normal.  Pulmonary/Chest: No respiratory distress.  Musculoskeletal:  Gait is somewhat antalgic.  Negative logroll bilateral hips.  Right knee unremarkable.  Right calf nontender.  She is mild to moderately tender over the right peroneal and posterior tibial tendons.  Mild to moderate right pre-Achilles bursa tenderness.  Achilles tendon itself nontender.  She is markedly tender across the anterior tibiotalar joint line.  Does have some ankle swelling without large effusion.  Neurovascular intact.  Neurological: She is alert and oriented to person, place, and time.  Skin: Skin is warm and dry.    Ortho Exam  Specialty Comments:  No specialty comments available.  Imaging:  No results found.   PMFS History: Patient Active Problem List   Diagnosis Date Noted  . Foot drop, right foot 05/21/2017  . Numbness of right hand 04/04/2017  . Estrogen deficiency 01/22/2017  . Hypothyroidism 07/30/2016  . Essential hypertension 07/30/2016  . Diabetes mellitus type 2 in obese (HCC) 07/30/2016  . Adjustment disorder with mixed anxiety and depressed mood 07/30/2016  . History of colonic polyps 07/30/2016  . Former smoker 01/26/2015  . BMI 32.0-32.9,adult 01/26/2015  . Stress incontinence 01/26/2015  .  History of pancreatitis 04/24/2013   Past Medical History:  Diagnosis Date  . Diabetes mellitus without complication (HCC)   . GERD (gastroesophageal reflux disease)   . Hypertension   . Non-traumatic rhabdomyolysis   . SVT (supraventricular tachycardia) (HCC)   . Thyroid disease    multinodular thyroid total thyroidectomy  . Transaminitis 09/21/2016  . 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 Arthritis Father   . COPD Father   . Diabetes Father   . Hypertension Father   . Heart failure Father        valvular disease  . Heart disease Father        4V CABG  . Heart attack Father   . Alcohol abuse Father     Past Surgical History:  Procedure Laterality Date  . ABDOMINAL HYSTERECTOMY    . CHOLECYSTECTOMY    . REDUCTION MAMMAPLASTY    . RIGHT OOPHORECTOMY  12/06/2012  . RIGHT OOPHORECTOMY Right   . ROBOTIC ASSISTED DIAGNOSTIC LAPAROSCOPY  01/10/2015  . THYROIDECTOMY     Social History   Occupational History  . 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 Smoking status: Former Smoker    Packs/day: 1.00    Years: 30.00    Pack years: 30.00    Types: Cigarettes    Last attempt to quit: 08/02/2016    Years since quitting: 1.7  . Smokeless tobacco: Never Used  Substance and Sexual Activity  . Alcohol use: Yes    Alcohol/week: 0.0 oz  . Drug use: No  . Sexual activity: Not on file

## 2018-05-01 NOTE — Telephone Encounter (Signed)
meloxicam refill Last Refill:01/29/18 # 90 Last OV: 08/17/15 PCP: Porfirio Oarhelle Jeffery  Pharmacy:[facility]

## 2018-05-02 ENCOUNTER — Other Ambulatory Visit: Payer: Self-pay

## 2018-05-02 MED ORDER — METOPROLOL SUCCINATE ER 50 MG PO TB24: 50.0000 mg | ORAL_TABLET | Freq: Two times a day (BID) | ORAL | 0 refills | Status: DC

## 2018-05-02 MED ORDER — OLMESARTAN MEDOXOMIL 20 MG PO TABS: 20.0000 mg | ORAL_TABLET | Freq: Every day | ORAL | 0 refills | Status: DC

## 2018-05-02 MED ORDER — GABAPENTIN 100 MG PO CAPS: 100.0000 mg | ORAL_CAPSULE | Freq: Three times a day (TID) | ORAL | 0 refills | Status: AC

## 2018-05-02 MED ORDER — CANAGLIFLOZIN 300 MG PO TABS: 300.0000 mg | ORAL_TABLET | Freq: Every day | ORAL | 0 refills | Status: DC

## 2018-05-02 MED ORDER — TRAZODONE HCL 100 MG PO TABS: 100.0000 mg | ORAL_TABLET | Freq: Every evening | ORAL | 0 refills | Status: AC | PRN

## 2018-05-02 MED ORDER — LEVOTHYROXINE SODIUM 150 MCG PO TABS: 150.0000 ug | ORAL_TABLET | Freq: Every day | ORAL | 0 refills | Status: DC

## 2018-05-02 NOTE — Telephone Encounter (Signed)
Medication refill

## 2018-05-02 NOTE — Telephone Encounter (Signed)
Medication refill. Needs to confirm if ov is needed.

## 2018-05-05 ENCOUNTER — Encounter: Payer: Self-pay | Admitting: Family Medicine

## 2018-05-05 ENCOUNTER — Other Ambulatory Visit: Payer: Self-pay

## 2018-05-05 ENCOUNTER — Ambulatory Visit (INDEPENDENT_AMBULATORY_CARE_PROVIDER_SITE_OTHER): Payer: BLUE CROSS/BLUE SHIELD | Admitting: Family Medicine

## 2018-05-05 VITALS — BP 138/80 | HR 90 | Temp 99.1°F | Resp 18 | Ht 59.69 in | Wt 153.0 lb

## 2018-05-05 DIAGNOSIS — L0293 Carbuncle, unspecified: Secondary | ICD-10-CM

## 2018-05-05 DIAGNOSIS — E1169 Type 2 diabetes mellitus with other specified complication: Secondary | ICD-10-CM | POA: Diagnosis not present

## 2018-05-05 DIAGNOSIS — E669 Obesity, unspecified: Secondary | ICD-10-CM | POA: Diagnosis not present

## 2018-05-05 MED ORDER — SULFAMETHOXAZOLE-TRIMETHOPRIM 800-160 MG PO TABS: 1.0000 | ORAL_TABLET | Freq: Two times a day (BID) | ORAL | 0 refills | Status: DC

## 2018-05-05 NOTE — Patient Instructions (Addendum)
   IF you received an x-ray today, you will receive an invoice from [facility]. Please contact [facility] at [phone] with questions or concerns regarding your invoice.   IF you received labwork today, you will receive an invoice from [facility]. Please contact [facility] at [phone] with questions or concerns regarding your invoice.   Our billing staff will not be able to assist you with questions regarding bills from these companies.  You will be contacted with the lab results as soon as they are available. The fastest way to get your results is to activate your My Chart account. Instructions are located on the last page of this paperwork. If you have not heard from us regarding the results in 2 weeks, please contact this office.     Skin Abscess A skin abscess is an infected area on or under your skin that contains a collection of pus and other material. An abscess may also be called a furuncle, carbuncle, or boil. An abscess can occur in or on almost any part of your body. Some abscesses break open (rupture) on their own. Most continue to get worse unless they are treated. The infection can spread deeper into the body and eventually into your blood, which can make you feel ill. Treatment usually involves draining the abscess. What are the causes? An abscess occurs when germs, often bacteria, pass through your skin and cause an infection. This may be caused by:  A scrape or cut on your skin.  A puncture wound through your skin, including a needle injection.  Blocked oil or sweat glands.  Blocked and infected hair follicles.  A cyst that forms beneath your skin (sebaceous cyst) and becomes infected.  What increases the risk? This condition is more likely to develop in people who:  Have a weak body defense system (immune system).  Have diabetes.  Have dry and irritated skin.  Get frequent injections or use illegal IV drugs.  Have a foreign body in a  wound, such as a splinter.  Have problems with their lymph system or veins.  What are the signs or symptoms? An abscess may start as a painful, firm bump under the skin. Over time, the abscess may get larger or become softer. Pus may appear at the top of the abscess, causing pressure and pain. It may eventually break through the skin and drain. Other symptoms include:  Redness.  Warmth.  Swelling.  Tenderness.  A sore on the skin.  How is this diagnosed? This condition is diagnosed based on your medical history and a physical exam. A sample of pus may be taken from the abscess to find out what is causing the infection and what antibiotics can be used to treat it. You also may have:  Blood tests to look for signs of infection or spread of an infection to your blood.  Imaging studies such as ultrasound, CT scan, or MRI if the abscess is deep.  How is this treated? Small abscesses that drain on their own may not need treatment. Treatment for an abscess that does not rupture on its own may include:  Warm compresses applied to the area several times per day.  Incision and drainage. Your health care provider will make an incision to open the abscess and will remove pus and any foreign body or dead tissue. The incision area may be packed with gauze to keep it open for a few days while it heals.  Antibiotic medicines to treat infection. For a severe   abscess, you may first get antibiotics through an IV and then change to oral antibiotics.  Follow these instructions at home: Abscess Care  If you have an abscess that has not drained, place a warm, clean, wet washcloth over the abscess several times a day. Do this as told by your health care provider.  Follow instructions from your health care provider about how to take care of your abscess. Make sure you: ? Cover the abscess with a bandage (dressing). ? Change your dressing or gauze as told by your health care provider. ? Wash your  hands with soap and water before you change the dressing or gauze. If soap and water are not available, use hand sanitizer.  Check your abscess every day for signs of a worsening infection. Check for: ? More redness, swelling, or pain. ? More fluid or blood. ? Warmth. ? More pus or a bad smell. Medicines  Take over-the-counter and prescription medicines only as told by your health care provider.  If you were prescribed an antibiotic medicine, take it as told by your health care provider. Do not stop taking the antibiotic even if you start to feel better. General instructions  To avoid spreading the infection: ? Do not share personal care items, towels, or hot tubs with others. ? Avoid making skin contact with other people.  Keep all follow-up visits as told by your health care provider. This is important. Contact a health care provider if:  You have more redness, swelling, or pain around your abscess.  You have more fluid or blood coming from your abscess.  Your abscess feels warm to the touch.  You have more pus or a bad smell coming from your abscess.  You have a fever.  You have muscle aches.  You have chills or a general ill feeling. Get help right away if:  You have severe pain.  You see red streaks on your skin spreading away from the abscess. This information is not intended to replace advice given to you by your health care provider. Make sure you discuss any questions you have with your health care provider. Document Released: 08/01/2005 Document Revised: 06/17/2016 Document Reviewed: 08/31/2015 Elsevier Interactive Patient Education  2018 Elsevier Inc.  

## 2018-05-05 NOTE — Progress Notes (Signed)
Subjective:    Patient ID: Jessica Knox, female    DOB: [birth year], 52 y.o.   MRN: [id number]  05/05/2018  ingrown hair (on bikini area )    HPI This 52 y.o. female presents for evaluation of ingrown hair in left groin region.  Developed yesterday with acute worsening last night.  Denies fever, chills, sweats.  Tried to squeeze last night yet was very painful.  Getting married in the upcoming weeks so would like treatment to avoid issues while at the beach on honeymoon.  Diabetes mellitus type 2   With most recent hemoglobin A1c of 7.2 on April 15, 2018 by PA Dellis Filbert her primary care provider.   BP Readings from Last 3 Encounters:  05/05/18 138/80  04/15/18 118/64  01/21/18 112/62   Wt Readings from Last 3 Encounters:  05/05/18 153 lb (69.4 kg)  04/15/18 153 lb (69.4 kg)  01/21/18 158 lb 6.4 oz (71.8 kg)   Immunization History  Administered Date(s) Administered  . Influenza Split 08/05/2014  . Influenza,inj,Quad PF,6+ Mos 08/17/2015, 07/30/2016  . Pneumococcal Polysaccharide-23 07/30/2016  . Tdap 07/30/2016    Review of Systems  Constitutional: Negative for chills, diaphoresis, fatigue and fever.  Respiratory: Negative for cough and shortness of breath.   Cardiovascular: Negative for chest pain, palpitations and leg swelling.  Gastrointestinal: Negative for abdominal pain, constipation, diarrhea, nausea and vomiting.  Endocrine: Negative for cold intolerance, heat intolerance, polydipsia, polyphagia and polyuria.  Skin: Positive for color change and wound. Negative for pallor and rash.    Past Medical History:  Diagnosis Date  . Diabetes mellitus without complication ([place])   . GERD (gastroesophageal reflux disease)   . Hypertension   . Non-traumatic rhabdomyolysis   . SVT (supraventricular tachycardia) ([place])   . Thyroid disease    multinodular thyroid total thyroidectomy  . Transaminitis 09/21/2016  . Traumatic compartment syndrome of right lower extremity  [facility])    Past Surgical History:  Procedure Laterality Date  . ABDOMINAL HYSTERECTOMY    . CHOLECYSTECTOMY    . REDUCTION MAMMAPLASTY    . RIGHT OOPHORECTOMY  12/06/2012  . RIGHT OOPHORECTOMY Right   . ROBOTIC ASSISTED DIAGNOSTIC LAPAROSCOPY  01/10/2015  . THYROIDECTOMY     Allergies  Allergen Reactions  . Atorvastatin Swelling and Other (See Comments)    Edema  . Ciprofloxacin Palpitations and Other (See Comments)    Can take in spaced, small doses  . Morphine And Related Itching   Current Outpatient Medications on File Prior to Visit  Medication Sig Dispense Refill  . ADVANCED FIBER COMPLEX PO Take 1 each by mouth 2 (two) times daily.     Marland Kitchen ALPRAZolam (XANAX) 0.5 MG tablet TAKE 1 TABLET BY MOUTH TWICE DAILY AS NEEDED FOR ANXIETY 60 tablet 0  . Biotin w/ Vitamins C & E (HAIR/SKIN/NAILS PO) Take 1 tablet by mouth daily.    . Blood Glucose Monitoring Suppl KIT Check blood sugar daily as needed 1 each 0  . buPROPion (WELLBUTRIN SR) 100 MG 12 hr tablet TAKE 2 TABLETS(200 MG) BY MOUTH TWICE DAILY 360 tablet 3  . canagliflozin (INVOKANA) 300 MG TABS tablet Take 1 tablet (300 mg total) by mouth daily before breakfast. 90 tablet 0  . CARTIA XT 180 MG 24 hr capsule TAKE 1 CAPSULE(180 MG) BY MOUTH DAILY 90 capsule 0  . cetirizine (ZYRTEC) 10 MG tablet Take 10 mg by mouth daily.    Marland Kitchen esomeprazole (NEXIUM) 40 MG capsule TAKE 1 CAPSULE DAILY 90 capsule 0  .  estradiol (ESTRACE) 1 MG tablet Take 1 tablet (1 mg total) by mouth daily. 90 tablet 1  . fluticasone (FLONASE) 50 MCG/ACT nasal spray Place 1 spray into both nostrils daily as needed for allergies or rhinitis.    Marland Kitchen gabapentin (NEURONTIN) 100 MG capsule Take 1 capsule (100 mg total) by mouth 3 (three) times daily. May increase up to 300 mg TID. 90 capsule 0  . glucose blood test strip Use as instructed 100 each 1  . Ipratropium-Albuterol (COMBIVENT) 20-100 MCG/ACT AERS respimat Inhale 1 puff into the lungs every 6 (six) hours. 3 Inhaler 1    . Lancets (ONETOUCH ULTRASOFT) lancets Test blood sugar daily. 100 each 3  . MEGARED OMEGA-3 KRILL OIL PO Take by mouth.    . meloxicam (MOBIC) 15 MG tablet TAKE 1 TABLET DAILY 30 tablet 0  . metoprolol succinate (TOPROL-XL) 50 MG 24 hr tablet Take 1 tablet (50 mg total) by mouth 2 (two) times daily. Take with or immediately following a meal. 180 tablet 0  . olmesartan (BENICAR) 20 MG tablet Take 1 tablet (20 mg total) by mouth daily. 90 tablet 0  . ondansetron (ZOFRAN-ODT) 8 MG disintegrating tablet Take 1 tablet (8 mg total) by mouth every 8 (eight) hours as needed for nausea. 30 tablet 0  . PROAIR HFA 108 (90 Base) MCG/ACT inhaler USE 2 INHALATIONS EVERY 4 HOURS AS NEEDED FOR WHEEZING OR SHORTNESS OF BREATH (COUGH, SHORTNESS OF BREATH OR WHEEZING) 25.5 g 1  . sertraline (ZOLOFT) 50 MG tablet TAKE 1 TABLET(50 MG) BY MOUTH DAILY 30 tablet 0  . traZODone (DESYREL) 100 MG tablet Take 1 tablet (100 mg total) by mouth at bedtime as needed for sleep. 90 tablet 0   No current facility-administered medications on file prior to visit.    Social History   Socioeconomic History  . Marital status: Divorced    Spouse name: Not on file  . Number of children: 3  . Years of education: Not on file  . Highest education level: Associate degree: occupational, Hotel manager, or vocational program  Occupational History  . 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 Financial resource strain: Not on file  . Food insecurity:    Worry: Not on file    Inability: Not on file  . Transportation needs:    Medical: Not on file    Non-medical: Not on file  Tobacco Use  . Smoking status: Former Smoker    Packs/day: 1.00    Years: 30.00    Pack years: 30.00    Types: Cigarettes    Last attempt to quit: 08/02/2016    Years since quitting: 1.7  . Smokeless tobacco: Never Used  Substance and Sexual Activity  . Alcohol use: Yes    Alcohol/week: 0.0 oz  . Drug use: No  . Sexual activity: Not  on file  Lifestyle  . Physical activity:    Days per week: Not on file    Minutes per session: Not on file  . Stress: Not on file  Relationships  . Social connections:    Talks on phone: Not on file    Gets together: Not on file    Attends religious service: Not on file    Active member of club or organization: Not on file    Attends meetings of clubs or organizations: Not on file    Relationship status: Not on file  . Intimate partner violence:    Fear of current or  ex partner: Not on file    Emotionally abused: Not on file    Physically abused: Not on file    Forced sexual activity: Not on file  Other Topics Concern  . Not on file  Social History Narrative   Exercise per patient cleaning her house and working in the yard.   Divorced 05/2014.   Lives with her boyfriend, Sherryll Burger, to whom she became engaged 02/2018.   Family History  Adopted: Yes  Problem Relation Age of Onset  . Arthritis Father   . COPD Father   . Diabetes Father   . Hypertension Father   . Heart failure Father        valvular disease  . Heart disease Father        4V CABG  . Heart attack Father   . Alcohol abuse Father        Objective:    BP 138/80   Pulse 90   Temp 99.1 F (37.3 C) (Oral)   Resp 18   Ht 4' 11.69" (1.516 m)   Wt 153 lb (69.4 kg)   SpO2 96%   BMI 30.19 kg/m  Physical Exam  Constitutional: She is oriented to person, place, and time. She appears well-developed and well-nourished. No distress.  HENT:  Head: Normocephalic and atraumatic.  Eyes: Pupils are equal, round, and reactive to light. Conjunctivae are normal.  Neck: Normal range of motion. Neck supple.  Cardiovascular: Normal rate, regular rhythm and normal heart sounds. Exam reveals no gallop and no friction rub.  No murmur heard. Pulmonary/Chest: Effort normal and breath sounds normal. She has no wheezes. She has no rales.  Neurological: She is alert and oriented to person, place, and time.  Skin: No abrasion, no  bruising, no burn, no ecchymosis, no laceration and no petechiae noted. She is not diaphoretic. No erythema.  Left groin.:  1.5 cm diameter carbuncle fluctuance and induration and tenderness to palpation.  Psychiatric: She has a normal mood and affect. Her behavior is normal.  Nursing note and vitals reviewed.  No results found. Depression screen [facility] 2/9 05/05/2018 04/15/2018 01/21/2018 10/16/2017 07/16/2017  Decreased Interest 0 0 0 0 0  Down, Depressed, Hopeless 0 0 0 0 0  PHQ - 2 Score 0 0 0 0 0  Altered sleeping - - - - -  Tired, decreased energy - - - - -  Change in appetite - - - - -  Feeling bad or failure about yourself  - - - - -  Trouble concentrating - - - - -  Moving slowly or fidgety/restless - - - - -  Suicidal thoughts - - - - -  PHQ-9 Score - - - - -   Fall Risk  05/05/2018 04/15/2018 01/21/2018 10/16/2017 07/16/2017  Falls in the past year? No No No No No    Procedure: Verbal consent obtained by patient.  Area cleansed.  2% lidocaine with epinephrine 2 cc administered into carbuncle.  Area prepped in normal sterile fashion.  11 blade advanced into the wound 10 mm incision.  Small amount of white-yellow bloody drainage expressed.  Wound culture obtained.  Packing applied into wound and taped to leg.  Good hemostasis.  Dressing applied.  Patient tolerated procedure well.    Assessment & Plan:   1. Carbuncle   2. Diabetes mellitus type 2 in obese [facility])     New onset carbuncle: Status post I&D complex in office.  Wound culture obtained.  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  Local wound care provided.  Remove packing in the upcoming 48 hours.  Return to clinic for fever, chills, sweats.  Keep wound clean and dry for the next 48 hours.  Change bandage twice daily.  Monitor sugars closely with acute infection.  Orders Placed This Encounter  Procedures  . WOUND CULTURE    Order Specific Question:   Source    Answer:   L labia majora/inguinal region    Meds ordered this encounter  Medications  . sulfamethoxazole-trimethoprim (BACTRIM DS,SEPTRA DS) 800-160 MG tablet    Sig: Take 1 tablet by mouth 2 (two) times daily.    Dispense:  20 tablet    Refill:  0    No follow-ups on file.   Anglia Blakley Elayne Guerin, M.D. Primary [facility] at [facility] [address] [place], [place]  [address] [phone] phone [phone] fax

## 2018-05-06 ENCOUNTER — Encounter: Payer: Self-pay | Admitting: Family Medicine

## 2018-05-07 ENCOUNTER — Other Ambulatory Visit: Payer: Self-pay | Admitting: Family Medicine

## 2018-05-07 DIAGNOSIS — E039 Hypothyroidism, unspecified: Secondary | ICD-10-CM

## 2018-05-07 DIAGNOSIS — E1169 Type 2 diabetes mellitus with other specified complication: Secondary | ICD-10-CM

## 2018-05-07 DIAGNOSIS — E669 Obesity, unspecified: Secondary | ICD-10-CM

## 2018-05-07 NOTE — Telephone Encounter (Signed)
Multiple prescription refills requested. Trulicity, Tramadol and Alprazolam.  Sent to Dr. Katrinka BlazingSmith.

## 2018-05-08 LAB — WOUND CULTURE

## 2018-05-09 ENCOUNTER — Other Ambulatory Visit: Payer: Self-pay | Admitting: Family Medicine

## 2018-05-09 NOTE — Telephone Encounter (Signed)
Copied from CRM [phone]#[id number]. Topic: Quick Communication - See Telephone Encounter >> May 09, 2018 11:09 AM Tamela OddiMartin, [place], NT wrote: CRM for notification. See Telephone encounter for: 05/09/18. Patient called and states she needs a refill of levothyroxine (SYNTHROID, LEVOTHROID) 150 MCG tablet  [facility] she no longer use [facility]. HSe needs traMADol (ULTRAM) 50 MG tablet , ALPRAZolam (XANAX) 0.5 MG tablet, TRULICITY 0.75 MG/0.5ML SOPN, and her nitrofurantoin (MACRODANTIN) 50 MG capsule  [facility] [id number] - [place], [place] - [address] [phone] (Phone) [phone] (Fax)

## 2018-05-10 ENCOUNTER — Encounter: Payer: Self-pay | Admitting: Family Medicine

## 2018-05-10 MED ORDER — NITROFURANTOIN MACROCRYSTAL 50 MG PO CAPS: 50.0000 mg | ORAL_CAPSULE | Freq: Every day | ORAL | 0 refills | Status: DC

## 2018-05-10 MED ORDER — LEVOTHYROXINE SODIUM 150 MCG PO TABS: 150.0000 ug | ORAL_TABLET | Freq: Every day | ORAL | 0 refills | Status: DC

## 2018-05-11 NOTE — Telephone Encounter (Signed)
Approved refill 05/10/18.

## 2018-06-03 ENCOUNTER — Ambulatory Visit (INDEPENDENT_AMBULATORY_CARE_PROVIDER_SITE_OTHER): Payer: BLUE CROSS/BLUE SHIELD | Admitting: Orthopaedic Surgery

## 2018-06-03 ENCOUNTER — Encounter (INDEPENDENT_AMBULATORY_CARE_PROVIDER_SITE_OTHER): Payer: Self-pay | Admitting: Orthopaedic Surgery

## 2018-06-03 VITALS — BP 127/81 | HR 88 | Ht 59.0 in | Wt 153.0 lb

## 2018-06-03 DIAGNOSIS — B999 Unspecified infectious disease: Secondary | ICD-10-CM | POA: Diagnosis not present

## 2018-06-03 DIAGNOSIS — M25561 Pain in right knee: Secondary | ICD-10-CM

## 2018-06-03 MED ORDER — MUPIROCIN 2 % EX OINT: TOPICAL_OINTMENT | Freq: Two times a day (BID) | CUTANEOUS | Status: DC

## 2018-06-03 NOTE — Progress Notes (Signed)
Office Visit Note   Patient: Jessica Knox           Date of Birth: 1966-10-28           MRN: [id number] Visit Date: 06/03/2018              Requested by: Jessica OarJeffery, Chelle, PA-C No address on file PCP: Jessica OarJeffery, Chelle, PA-C   Assessment & Plan: Visit Diagnoses:  1. Infection     Plan: Worrisome appears and she can use nasally with her history of positive MRSA culture when she had an infected hair follicle which was lanced and has resolved.  Knee-high teds that she can use intermittently for swelling.  She is gotten improvement in her anterior tib strength on the right is walking without foot drop symptoms.  She can return if she has increased problems.  Follow-Up Instructions: No follow-ups on file.   Orders:  No orders of the defined types were placed in this encounter.  Meds ordered this encounter  Medications  . mupirocin ointment (BACTROBAN) 2 %    One tube please. Thank you      Procedures: No procedures performed   Clinical Data: No additional findings.   Subjective: Chief Complaint  Patient presents with  . Right Foot - Follow-up    HPI 52 year old female returns had previous injection 04/30/2018 in her right ankle.  She states she has some aching in her leg when she drives and occasionally notices some swelling in her right lower extremity.  Past history of compartment syndrome treated conservatively.  She also had recent history of an infected hair follicle that culture grew MRSA.  No mupirocin nasal treatment was performed.  PCR 09/21/2016 was negative.  Patient is a diabetic.  Previous history of E. coli UTI several years ago.  Patient had temporary AFO that she used short-term after she was discharged but has not been using it.  She denies any tripping.  No chills or fever no associated back pain.  Review of Systems history of pancreatitis former smoker, stress incontinence hypertension, type 2 diabetes, foot drop which resolved secondary to  compartment syndrome anterior compartment.  Otherwise negative as it pertains HPI.   Objective: Vital Signs: BP 127/81   Pulse 88   Ht 4\' 11"  (1.499 m)   Wt 153 lb (69.4 kg)   BMI 30.90 kg/m   Physical Exam  Constitutional: She is oriented to person, place, and time. She appears well-developed.  HENT:  Head: Normocephalic.  Right Ear: External ear normal.  Left Ear: External ear normal.  Eyes: Pupils are equal, round, and reactive to light.  Neck: No tracheal deviation present. No thyromegaly present.  Cardiovascular: Normal rate.  Pulmonary/Chest: Effort normal.  Abdominal: Soft.  Neurological: She is alert and oriented to person, place, and time.  Skin: Skin is warm and dry.  Psychiatric: She has a normal mood and affect. Her behavior is normal.    Ortho Exam patient has negative logroll to the hips.  Distal pulses are intact.  Anterior tib EHL is strong.  She ambulates on her heels and toes.  No tenderness or prominence anterior lateral compartment right leg.  No swelling noted today.  Negative pitting edema.  No cellulitis.  Specialty Comments:  No specialty comments available.  Imaging: No results found.   PMFS History: Patient Active Problem List   Diagnosis Date Noted  . Foot drop, right foot 05/21/2017  . Numbness of right hand 04/04/2017  . Estrogen deficiency 01/22/2017  .  Hypothyroidism 07/30/2016  . Essential hypertension 07/30/2016  . Diabetes mellitus type 2 in obese (HCC) 07/30/2016  . Adjustment disorder with mixed anxiety and depressed mood 07/30/2016  . History of colonic polyps 07/30/2016  . Former smoker 01/26/2015  . BMI 32.0-32.9,adult 01/26/2015  . Stress incontinence 01/26/2015  . History of pancreatitis 04/24/2013   Past Medical History:  Diagnosis Date  . Diabetes mellitus without complication (HCC)   . GERD (gastroesophageal reflux disease)   . Hypertension   . Non-traumatic rhabdomyolysis   . SVT (supraventricular tachycardia)  (HCC)   . Thyroid disease    multinodular thyroid total thyroidectomy  . Transaminitis 09/21/2016  . 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 Arthritis Father   . COPD Father   . Diabetes Father   . Hypertension Father   . Heart failure Father        valvular disease  . Heart disease Father        4V CABG  . Heart attack Father   . Alcohol abuse Father     Past Surgical History:  Procedure Laterality Date  . ABDOMINAL HYSTERECTOMY    . CHOLECYSTECTOMY    . REDUCTION MAMMAPLASTY    . RIGHT OOPHORECTOMY  12/06/2012  . RIGHT OOPHORECTOMY Right   . ROBOTIC ASSISTED DIAGNOSTIC LAPAROSCOPY  01/10/2015  . THYROIDECTOMY     Social History   Occupational History  . 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 Smoking status: Former Smoker    Packs/day: 1.00    Years: 30.00    Pack years: 30.00    Types: Cigarettes    Last attempt to quit: 08/02/2016    Years since quitting: 1.8  . Smokeless tobacco: Never Used  Substance and Sexual Activity  . Alcohol use: Yes    Alcohol/week: 0.0 oz  . Drug use: No  . Sexual activity: Not on file

## 2018-06-04 ENCOUNTER — Encounter (INDEPENDENT_AMBULATORY_CARE_PROVIDER_SITE_OTHER): Payer: Self-pay | Admitting: Orthopaedic Surgery

## 2018-07-17 ENCOUNTER — Other Ambulatory Visit (HOSPITAL_BASED_OUTPATIENT_CLINIC_OR_DEPARTMENT_OTHER): Payer: Self-pay | Admitting: Physician Assistant

## 2018-07-17 DIAGNOSIS — Z1231 Encounter for screening mammogram for malignant neoplasm of breast: Secondary | ICD-10-CM

## 2018-07-17 DIAGNOSIS — E2839 Other primary ovarian failure: Secondary | ICD-10-CM

## 2018-07-22 ENCOUNTER — Ambulatory Visit (HOSPITAL_BASED_OUTPATIENT_CLINIC_OR_DEPARTMENT_OTHER)
Admission: RE | Admit: 2018-07-22 | Discharge: 2018-07-22 | Disposition: A | Discharge status: Home / Self Care | Payer: BLUE CROSS/BLUE SHIELD | Source: Ambulatory Visit | Attending: Physician Assistant | Admitting: Physician Assistant

## 2018-07-22 DIAGNOSIS — E2839 Other primary ovarian failure: Secondary | ICD-10-CM

## 2018-08-19 ENCOUNTER — Other Ambulatory Visit: Payer: Self-pay | Admitting: Family Medicine

## 2018-08-19 DIAGNOSIS — I10 Essential (primary) hypertension: Secondary | ICD-10-CM

## 2018-08-19 DIAGNOSIS — F4323 Adjustment disorder with mixed anxiety and depressed mood: Secondary | ICD-10-CM

## 2018-08-19 NOTE — Telephone Encounter (Signed)
Attempted to contact pt regarding refill request; pt last seen at [facility] 01/21/18; pt last seen by Theora Gianotti 07/14/18 at [facility]; when pt calls back please verify if she has transferred her care or will she remain a patient at [facility].

## 2018-12-05 ENCOUNTER — Ambulatory Visit (INDEPENDENT_AMBULATORY_CARE_PROVIDER_SITE_OTHER): Payer: BLUE CROSS/BLUE SHIELD | Admitting: Orthopaedic Surgery

## 2019-08-03 ENCOUNTER — Other Ambulatory Visit: Payer: Self-pay

## 2019-08-03 ENCOUNTER — Emergency Department (HOSPITAL_COMMUNITY): Payer: Worker's Compensation

## 2019-08-03 ENCOUNTER — Encounter (HOSPITAL_COMMUNITY): Payer: Self-pay

## 2019-08-03 ENCOUNTER — Emergency Department (HOSPITAL_COMMUNITY)
Admission: EM | Admit: 2019-08-03 | Discharge: 2019-08-04 | Disposition: A | Discharge status: Home / Self Care | Payer: Worker's Compensation | Attending: Emergency Medicine | Admitting: Emergency Medicine

## 2019-08-03 DIAGNOSIS — R079 Chest pain, unspecified: Secondary | ICD-10-CM | POA: Diagnosis not present

## 2019-08-03 DIAGNOSIS — M7918 Myalgia, other site: Secondary | ICD-10-CM

## 2019-08-03 DIAGNOSIS — S20219A Contusion of unspecified front wall of thorax, initial encounter: Secondary | ICD-10-CM

## 2019-08-03 DIAGNOSIS — M545 Low back pain: Secondary | ICD-10-CM | POA: Insufficient documentation

## 2019-08-03 DIAGNOSIS — R1010 Upper abdominal pain, unspecified: Secondary | ICD-10-CM | POA: Diagnosis not present

## 2019-08-03 DIAGNOSIS — I10 Essential (primary) hypertension: Secondary | ICD-10-CM | POA: Insufficient documentation

## 2019-08-03 DIAGNOSIS — E119 Type 2 diabetes mellitus without complications: Secondary | ICD-10-CM | POA: Insufficient documentation

## 2019-08-03 DIAGNOSIS — Y999 Unspecified external cause status: Secondary | ICD-10-CM | POA: Insufficient documentation

## 2019-08-03 DIAGNOSIS — Y92410 Unspecified street and highway as the place of occurrence of the external cause: Secondary | ICD-10-CM | POA: Diagnosis not present

## 2019-08-03 DIAGNOSIS — Y9389 Activity, other specified: Secondary | ICD-10-CM | POA: Diagnosis not present

## 2019-08-03 DIAGNOSIS — Z79899 Other long term (current) drug therapy: Secondary | ICD-10-CM | POA: Insufficient documentation

## 2019-08-03 DIAGNOSIS — E039 Hypothyroidism, unspecified: Secondary | ICD-10-CM | POA: Insufficient documentation

## 2019-08-03 DIAGNOSIS — Z87891 Personal history of nicotine dependence: Secondary | ICD-10-CM | POA: Insufficient documentation

## 2019-08-03 LAB — I-STAT CHEM 8, ED
BUN: 21 mg/dL — ABNORMAL HIGH (ref 6–20)
Calcium, Ion: 1.01 mmol/L — ABNORMAL LOW (ref 1.15–1.40)
Chloride: 101 mmol/L (ref 98–111)
Creatinine, Ser: 0.6 mg/dL (ref 0.44–1.00)
Glucose, Bld: 82 mg/dL (ref 70–99)
HCT: 52 % — ABNORMAL HIGH (ref 36.0–46.0)
Hemoglobin: 17.7 g/dL — ABNORMAL HIGH (ref 12.0–15.0)
Potassium: 3.7 mmol/L (ref 3.5–5.1)
Sodium: 137 mmol/L (ref 135–145)
TCO2: 25 mmol/L (ref 22–32)

## 2019-08-03 LAB — CBG MONITORING, ED: Glucose-Capillary: 86 mg/dL (ref 70–99)

## 2019-08-03 MED ORDER — OXYCODONE-ACETAMINOPHEN 5-325 MG PO TABS
1.0000 | ORAL_TABLET | Freq: Once | ORAL | Status: DC
  Filled 2019-08-03: qty 1

## 2019-08-03 MED ORDER — IOHEXOL 300 MG/ML  SOLN
100.0000 mL | Freq: Once | INTRAMUSCULAR | Status: AC | PRN
  Administered 2019-08-03: 100 mL via INTRAVENOUS

## 2019-08-03 MED ORDER — ONDANSETRON HCL 4 MG/2ML IJ SOLN
4.0000 mg | Freq: Once | INTRAMUSCULAR | Status: AC
  Administered 2019-08-03: 4 mg via INTRAVENOUS
  Filled 2019-08-03: qty 2

## 2019-08-03 MED ORDER — HYDROMORPHONE HCL 1 MG/ML IJ SOLN
0.5000 mg | Freq: Once | INTRAMUSCULAR | Status: AC
  Administered 2019-08-03: 0.5 mg via INTRAVENOUS
  Filled 2019-08-03: qty 1

## 2019-08-03 MED ORDER — ONDANSETRON 4 MG PO TBDP
4.0000 mg | ORAL_TABLET | Freq: Once | ORAL | Status: DC
  Filled 2019-08-03: qty 1
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
## 2019-08-04 LAB — RAPID URINE DRUG SCREEN, HOSP PERFORMED
Amphetamines: NOT DETECTED
Barbiturates: NOT DETECTED
Benzodiazepines: POSITIVE — AB
Cocaine: NOT DETECTED
Opiates: NOT DETECTED
Tetrahydrocannabinol: NOT DETECTED

## 2019-08-04 MED ORDER — CYCLOBENZAPRINE HCL 10 MG PO TABS: 10.0000 mg | ORAL_TABLET | Freq: Two times a day (BID) | ORAL | 0 refills | Status: DC | PRN

## 2019-08-04 MED ORDER — OXYCODONE-ACETAMINOPHEN 5-325 MG PO TABS: 1.0000 | ORAL_TABLET | ORAL | 0 refills | Status: DC | PRN

## 2019-08-04 NOTE — Discharge Instructions (Signed)
Take medications as prescribed. Follow up with your doctor if your pain does not significantly improve over the next 4-5 days.

## 2019-08-12 ENCOUNTER — Other Ambulatory Visit (HOSPITAL_BASED_OUTPATIENT_CLINIC_OR_DEPARTMENT_OTHER): Payer: Self-pay | Admitting: Physician Assistant

## 2019-08-12 DIAGNOSIS — Z1231 Encounter for screening mammogram for malignant neoplasm of breast: Secondary | ICD-10-CM

## 2020-03-08 ENCOUNTER — Ambulatory Visit (HOSPITAL_BASED_OUTPATIENT_CLINIC_OR_DEPARTMENT_OTHER)
Admission: RE | Admit: 2020-03-08 | Discharge: 2020-03-08 | Disposition: A | Discharge status: Home / Self Care | Payer: BC Managed Care – PPO | Source: Ambulatory Visit | Attending: Physician Assistant | Admitting: Physician Assistant

## 2020-03-08 ENCOUNTER — Other Ambulatory Visit: Payer: Self-pay

## 2020-03-08 ENCOUNTER — Encounter (HOSPITAL_BASED_OUTPATIENT_CLINIC_OR_DEPARTMENT_OTHER): Payer: Self-pay

## 2020-03-08 DIAGNOSIS — Z1231 Encounter for screening mammogram for malignant neoplasm of breast: Secondary | ICD-10-CM

## 2021-05-11 ENCOUNTER — Other Ambulatory Visit (HOSPITAL_BASED_OUTPATIENT_CLINIC_OR_DEPARTMENT_OTHER): Payer: Self-pay | Admitting: Physician Assistant

## 2021-05-11 ENCOUNTER — Other Ambulatory Visit (HOSPITAL_BASED_OUTPATIENT_CLINIC_OR_DEPARTMENT_OTHER): Payer: Self-pay | Admitting: Otolaryngology

## 2021-05-11 DIAGNOSIS — Z1231 Encounter for screening mammogram for malignant neoplasm of breast: Secondary | ICD-10-CM

## 2021-05-23 ENCOUNTER — Other Ambulatory Visit: Payer: Self-pay

## 2021-05-23 ENCOUNTER — Encounter (HOSPITAL_BASED_OUTPATIENT_CLINIC_OR_DEPARTMENT_OTHER): Payer: Self-pay

## 2021-05-23 ENCOUNTER — Ambulatory Visit (HOSPITAL_BASED_OUTPATIENT_CLINIC_OR_DEPARTMENT_OTHER)
Admission: RE | Admit: 2021-05-23 | Discharge: 2021-05-23 | Disposition: A | Discharge status: Home / Self Care | Payer: BC Managed Care – PPO | Source: Ambulatory Visit | Attending: Physician Assistant | Admitting: Physician Assistant

## 2021-05-23 DIAGNOSIS — Z1231 Encounter for screening mammogram for malignant neoplasm of breast: Secondary | ICD-10-CM | POA: Diagnosis present

## 2022-03-16 ENCOUNTER — Ambulatory Visit: Payer: BC Managed Care – PPO | Admitting: Cardiology

## 2022-03-16 ENCOUNTER — Encounter: Payer: Self-pay | Admitting: Cardiology

## 2022-03-16 VITALS — BP 100/65 | HR 85 | Temp 98.2°F | Resp 16 | Ht 59.0 in | Wt 137.0 lb

## 2022-03-16 DIAGNOSIS — E119 Type 2 diabetes mellitus without complications: Secondary | ICD-10-CM

## 2022-03-16 DIAGNOSIS — R9431 Abnormal electrocardiogram [ECG] [EKG]: Secondary | ICD-10-CM

## 2022-03-16 DIAGNOSIS — R002 Palpitations: Secondary | ICD-10-CM

## 2022-03-16 DIAGNOSIS — I493 Ventricular premature depolarization: Secondary | ICD-10-CM

## 2022-03-16 NOTE — Progress Notes (Signed)
? ?Primary Physician/Referring:  Harrison Mons, PA ? ?Patient ID: Jessica Knox, female    DOB: August 30, 1966, 56 y.o.   MRN: [id number] ? ?Chief Complaint  ?Patient presents with  ? Palpitations  ? Symptomatic PVC's  ? New Patient (Initial Visit)  ?  Referred by Daphane Shepherd, PA  ? ?HPI:   ? ?Jessica Knox  is a 56 y.o. Caucasian female patient with hypertension, diabetes mellitus type 2 controlled, hypothyroidism on Synthroid, history of rhabdomyolysis in 2017 when she presented with cellulitis of left foot and foot drop and also on a statin with Lipitor with underlying hypothyroidism.  She is now referred back to me for evaluation of palpitations that started about 3 weeks ago. ? ?Patient has had chronic palpitations but recently have been worse.  Fortunately since last seen by PCP, she was added propranolol 20 mg 3 times daily as needed and the dose of Synthroid was reduced, symptoms are markedly improved.  She denies chest pain or shortness of breath or leg edema. ? ?Past Medical History:  ?Diagnosis Date  ? Diabetes mellitus without complication ([place])   ? GERD (gastroesophageal reflux disease)   ? Hypertension   ? Non-traumatic rhabdomyolysis   ? SVT (supraventricular tachycardia) ([place])   ? Thyroid disease   ? multinodular thyroid total thyroidectomy  ? Transaminitis 09/21/2016  ? 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 ?Past Surgical History:  ?Procedure Laterality Date  ? ABDOMINAL HYSTERECTOMY    ? CHOLECYSTECTOMY    ? REDUCTION MAMMAPLASTY    ? RIGHT OOPHORECTOMY  12/06/2012  ? RIGHT OOPHORECTOMY Right   ? ROBOTIC ASSISTED DIAGNOSTIC LAPAROSCOPY  01/10/2015  ? THYROIDECTOMY    ? ?Family History  ?Adopted: Yes  ?Problem Relation Age of Onset  ? Arthritis Father   ? COPD Father   ? Diabetes Father   ? Hypertension Father   ? Heart failure Father   ?     valvular disease  ? Heart disease Father   ?     4V CABG  ? Heart attack Father   ? Alcohol abuse Father   ?  ?Social  History  ? ?Tobacco Use  ? Smoking status: Former  ?  Packs/day: 1.00  ?  Years: 30.00  ?  Pack years: 30.00  ?  Types: Cigarettes  ?  Quit date: 08/02/2016  ?  Years since quitting: 5.6  ? Smokeless tobacco: Never  ?Substance Use Topics  ? Alcohol use: Yes  ?  Alcohol/week: 0.0 standard drinks  ? ?Marital Status: Divorced  ?ROS  ?Review of Systems  ?Cardiovascular:  Positive for palpitations. Negative for chest pain, dyspnea on exertion and leg swelling.  ?Objective  ?Blood pressure 100/65, pulse 85, temperature 98.2 ?F (36.8 ?C), temperature source Temporal, resp. rate 16, height '4\' 11"'  (1.499 m), weight 137 lb (62.1 kg), SpO2 94 %. Body mass index is 27.67 kg/m?.  ? ?  03/16/2022  ? 12:06 PM 08/04/2019  ? 12:53 AM 08/03/2019  ?  5:27 PM  ?Vitals with BMI  ?Height '4\' 11"'     ?Weight 137 lbs    ?BMI 27.66    ?Systolic 177 98 939  ?Diastolic 65 56 91  ?Pulse 85 76 97  ?  ?Physical Exam ?Neck:  ?   Vascular: No JVD.  ?Cardiovascular:  ?   Rate and Rhythm: Normal rate and regular rhythm.  ?   Pulses: Intact distal pulses.  ?   Heart sounds: Normal heart sounds. No  murmur heard. ?  No gallop.  ?Pulmonary:  ?   Effort: Pulmonary effort is normal.  ?   Breath sounds: Normal breath sounds.  ?Abdominal:  ?   General: Bowel sounds are normal.  ?   Palpations: Abdomen is soft.  ?Musculoskeletal:  ?   Right lower leg: No edema.  ?   Left lower leg: No edema.  ? ? ?Medications and allergies  ? ?Allergies  ?Allergen Reactions  ? Atorvastatin Swelling and Other (See Comments)  ?  Edema  ? Statins Other (See Comments)  ?  Rhabdomyolysis  ? Ciprofloxacin Palpitations and Other (See Comments)  ?  Can take in spaced, small doses  ? Morphine And Related Itching  ?  ? ?Medication list after today's encounter  ? ?Current Outpatient Medications:  ?  ALPRAZolam (XANAX) 0.5 MG tablet, TAKE 1 TABLET BY MOUTH TWICE DAILY AS NEEDED FOR ANXIETY, Disp: 60 tablet, Rfl: 0 ?  Biotin w/ Vitamins C & E (HAIR/SKIN/NAILS PO), Take 1 tablet by mouth  daily., Disp: , Rfl:  ?  Blood Glucose Monitoring Suppl KIT, Check blood sugar daily as needed, Disp: 1 each, Rfl: 0 ?  cetirizine (ZYRTEC) 10 MG tablet, Take 10 mg by mouth daily., Disp: , Rfl:  ?  cyclobenzaprine (FLEXERIL) 10 MG tablet, Take 1 tablet (10 mg total) by mouth 2 (two) times daily as needed for muscle spasms., Disp: 20 tablet, Rfl: 0 ?  diclofenac (VOLTAREN) 75 MG EC tablet, Take 75 mg by mouth 2 (two) times daily., Disp: , Rfl:  ?  diltiazem (CARDIZEM CD) 120 MG 24 hr capsule, Take 120 mg by mouth daily., Disp: , Rfl:  ?  esomeprazole (NEXIUM) 40 MG capsule, TAKE 1 CAPSULE DAILY, Disp: 90 capsule, Rfl: 0 ?  estradiol (ESTRACE) 1 MG tablet, Take 1 tablet (1 mg total) by mouth daily., Disp: 90 tablet, Rfl: 1 ?  FARXIGA 10 MG TABS tablet, Take 10 mg by mouth daily., Disp: , Rfl:  ?  fluticasone (FLONASE) 50 MCG/ACT nasal spray, Place 1 spray into both nostrils daily as needed for allergies or rhinitis., Disp: , Rfl:  ?  gabapentin (NEURONTIN) 100 MG capsule, Take 1 capsule (100 mg total) by mouth 3 (three) times daily. May increase up to 300 mg TID. (Patient taking differently: Take 100 mg by mouth as needed. May increase up to 300 mg TID.), Disp: 90 capsule, Rfl: 0 ?  glucose blood test strip, Use as instructed, Disp: 100 each, Rfl: 1 ?  ibuprofen (ADVIL) 200 MG tablet, Take by mouth., Disp: , Rfl:  ?  Lancets (ONETOUCH ULTRASOFT) lancets, Test blood sugar daily., Disp: 100 each, Rfl: 3 ?  levothyroxine (SYNTHROID, LEVOTHROID) 150 MCG tablet, Take 1 tablet (150 mcg total) by mouth daily before breakfast., Disp: 90 tablet, Rfl: 0 ?  meloxicam (MOBIC) 15 MG tablet, TAKE 1 TABLET DAILY, Disp: 30 tablet, Rfl: 0 ?  naltrexone (DEPADE) 50 MG tablet, TAKE 1 TABLET PO DAILY, Disp: , Rfl: 1 ?  NON FORMULARY, 1-2 Pump. Diclofenac gel, Gabapentin, Flexeril, & Lidocain, Disp: , Rfl:  ?  NONFORMULARY OR COMPOUNDED ITEM, Diclofenac gel, Gabapentin, Flexeril, & Lidocain, Disp: , Rfl:  ?  olmesartan (BENICAR) 20 MG  tablet, Take 1 tablet (20 mg total) by mouth daily., Disp: 90 tablet, Rfl: 0 ?  ondansetron (ZOFRAN-ODT) 8 MG disintegrating tablet, Take 1 tablet (8 mg total) by mouth every 8 (eight) hours as needed for nausea., Disp: 30 tablet, Rfl: 0 ?  PROAIR HFA 108 (  90 Base) MCG/ACT inhaler, USE 2 INHALATIONS EVERY 4 HOURS AS NEEDED FOR WHEEZING OR SHORTNESS OF BREATH (COUGH, SHORTNESS OF BREATH OR WHEEZING), Disp: 25.5 g, Rfl: 1 ?  propranolol (INDERAL) 20 MG tablet, Take 20 mg by mouth 3 (three) times daily as needed (Palpitations)., Disp: , Rfl:  ?  tirzepatide (MOUNJARO) 5 MG/0.5ML Pen, Inject 0.5 mg into the skin., Disp: , Rfl:  ?  traZODone (DESYREL) 100 MG tablet, Take 1 tablet (100 mg total) by mouth at bedtime as needed for sleep., Disp: 90 tablet, Rfl: 0 ?  valACYclovir (VALTREX) 1000 MG tablet, Take 1 tablet by mouth as needed., Disp: , Rfl:  ? ?Current Facility-Administered Medications:  ?  mupirocin ointment (BACTROBAN) 2 %, , Topical, BID, Marybelle Killings, MD ? ?Laboratory examination:  ? ?External labs:  ? ?Labs 02/08/2022: ? ?Hb 15.1/HCT 42.8, platelets 220.  Normal indicis. ? ?A1c 5.7%.  TSH markedly reduced at 0.005. ? ?Total cholesterol 171, triglycerides 143, HDL 51, LDL 95. ? ?Serum glucose 89 mg, BUN 12, creatinine 0.56, EGFR 109 mL, potassium 4.0.  AST normal, ALT mildly elevated at 40. ? ?Radiology:  ? ? ?Cardiac Studies:  ? ?Echocardiogram 04/04/2016: ?1. Poor echo window. Wall motion with reduced ?sensitivity. Left ventricle cavity is normal in size. Mild ?concentric hypertrophy of the left ventricle. Normal global ?wall motion. Normal diastolic filling pattern. Visual EF is ?50-55%. ?2. Mild tricuspid regurgitation. No evidence of pulmonary ?hypertension. ?3. PVC noted. ? ?Lexiscan nuclear stress test 03/30/2016: ?Resting EKG demonstrated normal sinus rhythm, no arrhythmias.  Stress EKG is nondiagnostic as it is pharmacologic stress test using Lexiscan.  Patient could not complete treadmill stress test  due to chest pain, dyspnea and fatigue. ?Hypertensive at rest and stress. ?Myocardial perfusion imaging is normal.  LV systolic function is normal without wall motion abnormality.  Ejection fraction calculated a

## 2022-03-21 ENCOUNTER — Ambulatory Visit: Payer: BC Managed Care – PPO

## 2022-03-21 DIAGNOSIS — R9431 Abnormal electrocardiogram [ECG] [EKG]: Secondary | ICD-10-CM

## 2022-03-21 DIAGNOSIS — I493 Ventricular premature depolarization: Secondary | ICD-10-CM

## 2022-03-22 ENCOUNTER — Encounter (HOSPITAL_BASED_OUTPATIENT_CLINIC_OR_DEPARTMENT_OTHER): Payer: Self-pay

## 2022-03-22 ENCOUNTER — Other Ambulatory Visit: Payer: Self-pay

## 2022-03-22 ENCOUNTER — Emergency Department (HOSPITAL_BASED_OUTPATIENT_CLINIC_OR_DEPARTMENT_OTHER)
Admission: EM | Admit: 2022-03-22 | Discharge: 2022-03-23 | Disposition: A | Discharge status: Home / Self Care | Payer: BC Managed Care – PPO | Attending: Emergency Medicine | Admitting: Emergency Medicine

## 2022-03-22 ENCOUNTER — Emergency Department (HOSPITAL_BASED_OUTPATIENT_CLINIC_OR_DEPARTMENT_OTHER): Payer: BC Managed Care – PPO

## 2022-03-22 DIAGNOSIS — E119 Type 2 diabetes mellitus without complications: Secondary | ICD-10-CM | POA: Diagnosis not present

## 2022-03-22 DIAGNOSIS — R0602 Shortness of breath: Secondary | ICD-10-CM | POA: Insufficient documentation

## 2022-03-22 DIAGNOSIS — Z7985 Long-term (current) use of injectable non-insulin antidiabetic drugs: Secondary | ICD-10-CM | POA: Diagnosis not present

## 2022-03-22 DIAGNOSIS — R072 Precordial pain: Secondary | ICD-10-CM | POA: Diagnosis present

## 2022-03-22 DIAGNOSIS — I10 Essential (primary) hypertension: Secondary | ICD-10-CM | POA: Insufficient documentation

## 2022-03-22 DIAGNOSIS — Z79899 Other long term (current) drug therapy: Secondary | ICD-10-CM | POA: Diagnosis not present

## 2022-03-22 LAB — BASIC METABOLIC PANEL
Anion gap: 6 (ref 5–15)
BUN: 15 mg/dL (ref 6–20)
CO2: 25 mmol/L (ref 22–32)
Calcium: 8.1 mg/dL — ABNORMAL LOW (ref 8.9–10.3)
Chloride: 108 mmol/L (ref 98–111)
Creatinine, Ser: 0.69 mg/dL (ref 0.44–1.00)
GFR, Estimated: >60 mL/min (ref >60)
Glucose, Bld: 91 mg/dL (ref 70–99)
Potassium: 3.5 mmol/L (ref 3.5–5.1)
Sodium: 139 mmol/L (ref 135–145)

## 2022-03-22 LAB — TROPONIN I (HIGH SENSITIVITY): Troponin I (High Sensitivity): 3 ng/L (ref <18)

## 2022-03-22 LAB — CBC
HCT: 42.9 % (ref 36.0–46.0)
Hemoglobin: 14.9 g/dL (ref 12.0–15.0)
MCH: 31.8 pg (ref 26.0–34.0)
MCHC: 34.7 g/dL (ref 30.0–36.0)
MCV: 91.5 fL (ref 80.0–100.0)
Platelets: 256 10*3/uL (ref 150–400)
RBC: 4.69 MIL/uL (ref 3.87–5.11)
RDW: 12.3 % (ref 11.5–15.5)
WBC: 9.4 10*3/uL (ref 4.0–10.5)
nRBC: 0 % (ref 0.0–0.2)

## 2022-03-22 NOTE — ED Triage Notes (Signed)
Pt reports 3 month hx of bigeminy and PVCs and now reports left sided chest pain "it feels like its being squished" onset 45 mins PTA (2015pm). Awaiting echo results from yesterday. Also reports SOB.

## 2022-03-23 ENCOUNTER — Encounter: Payer: Self-pay | Admitting: Cardiology

## 2022-03-23 DIAGNOSIS — I493 Ventricular premature depolarization: Secondary | ICD-10-CM

## 2022-03-23 DIAGNOSIS — R002 Palpitations: Secondary | ICD-10-CM

## 2022-03-23 LAB — TROPONIN I (HIGH SENSITIVITY): Troponin I (High Sensitivity): 3 ng/L (ref <18)

## 2022-03-23 MED ORDER — METOPROLOL TARTRATE 50 MG PO TABS: 50.0000 mg | ORAL_TABLET | Freq: Two times a day (BID) | ORAL | 2 refills | Status: DC

## 2022-03-23 NOTE — Telephone Encounter (Signed)
From patient.

## 2022-03-23 NOTE — ED Notes (Signed)
ED Provider at bedside. 

## 2022-03-23 NOTE — ED Provider Notes (Signed)
[facility] EMERGENCY DEPARTMENT Provider Note   CSN: [id number] Arrival date & time: 03/22/22  2051     History  Chief Complaint  Patient presents with   Chest Pain    Jessica Knox is a 56 y.o. female.  The history is provided by the patient.  Chest Pain Pain location:  Substernal area Pain radiates to:  Does not radiate Onset quality:  Gradual Timing:  Constant Progression:  Unchanged Chronicity:  New Context: not breathing   Relieved by:  Nothing Worsened by:  Nothing Associated symptoms: shortness of breath   Associated symptoms: no cough, no diaphoresis, no fever and no vomiting   Risk factors: diabetes mellitus and hypertension   Risk factors: no coronary artery disease    HPI: A 56 year old patient with a history of treated diabetes and hypertension presents for evaluation of chest pain. Initial onset of pain was approximately 3-6 hours ago. The patient's chest pain is described as heaviness/pressure/tightness and is not worse with exertion. The patient's chest pain is middle- or left-sided, is not well-localized, is not sharp and does not radiate to the arms/jaw/neck. The patient does not complain of nausea and denies diaphoresis. The patient has no history of stroke, has no history of peripheral artery disease, has not smoked in the past 90 days, has no relevant family history of coronary artery disease (first degree relative at less than age 85), has no history of hypercholesterolemia and does not have an elevated BMI (>=30).   Patient recent history of palpitations and bigeminy.  She has been seen outpatient by cardiologist.  Earlier tonight she started having central chest pain and pressure.  She reports shortness of breath.  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 Authorizing Provider  ALPRAZolam Duanne Moron) 0.5 MG tablet TAKE 1 TABLET BY MOUTH TWICE DAILY AS NEEDED FOR ANXIETY 12/20/17   Harrison Mons, PA  Biotin w/ Vitamins C & E (HAIR/SKIN/NAILS PO) Take 1 tablet by mouth daily.    [provider]  Blood Glucose Monitoring Suppl KIT Check blood sugar daily as needed 08/03/16   Harrison Mons, PA  cetirizine (ZYRTEC) 10 MG tablet Take 10 mg by mouth daily.    [provider]  cyclobenzaprine (FLEXERIL) 10 MG tablet Take 1 tablet (10 mg total) by mouth 2 (two) times daily as needed for muscle spasms. 08/04/19   Charlann Lange, PA-C  diclofenac (VOLTAREN) 75 MG EC tablet Take 75 mg by mouth 2 (two) times daily. 12/05/21   [provider]  diltiazem (CARDIZEM CD) 120 MG 24 hr capsule Take 120 mg by mouth daily.    [provider]  esomeprazole (NEXIUM) 40 MG capsule TAKE 1 CAPSULE DAILY 03/04/18   Harrison Mons, PA  estradiol (ESTRACE) 1 MG tablet Take 1 tablet (1 mg total) by mouth daily. 11/11/17   Jeffery, Chelle, PA  FARXIGA 10 MG TABS tablet Take 10 mg by mouth daily. 02/08/22   [provider]  fluticasone (FLONASE) 50 MCG/ACT nasal spray Place 1 spray into both nostrils daily as  needed for allergies or rhinitis.    [provider]  gabapentin (NEURONTIN) 100 MG capsule Take 1 capsule (100 mg total) by mouth 3 (three) times daily. May increase up to 300 mg TID. Patient taking differently: Take 100 mg by mouth as needed. May increase up to 300 mg TID. 05/02/18   Jacelyn Pi, Lilia Argue, MD  glucose blood test strip Use as instructed 08/28/16   Harrison Mons, PA  ibuprofen (ADVIL) 200 MG tablet Take by mouth.    [provider]  Lancets [facility] ULTRASOFT) lancets Test blood sugar daily. 08/17/16   Harrison Mons, PA  levothyroxine (SYNTHROID, LEVOTHROID) 150 MCG tablet Take 1 tablet (150 mcg total) by mouth daily before breakfast. 05/10/18   Wardell Honour, MD  meloxicam (MOBIC) 15 MG tablet TAKE 1 TABLET DAILY 05/02/18   Jacelyn Pi, Lilia Argue, MD  naltrexone (DEPADE) 50 MG tablet TAKE 1 TABLET PO DAILY 05/18/18   [provider]  NON FORMULARY 1-2 Pump. Diclofenac gel, Gabapentin, Flexeril, & Lidocain 11/06/21   [provider]  NONFORMULARY OR COMPOUNDED ITEM Diclofenac gel, Gabapentin, Flexeril, & Lidocain    [provider]  olmesartan (BENICAR) 20 MG tablet Take 1 tablet (20 mg total) by mouth daily. 05/02/18   Daleen Squibb, MD  ondansetron (ZOFRAN-ODT) 8 MG disintegrating tablet Take 1 tablet (8 mg total) by mouth every 8 (eight) hours as needed for nausea. 01/21/18   Jeffery, Chelle, PA  PROAIR HFA 108 (90 Base) MCG/ACT inhaler USE 2 INHALATIONS EVERY 4 HOURS AS NEEDED FOR WHEEZING OR SHORTNESS OF BREATH (COUGH, SHORTNESS OF BREATH OR WHEEZING) 11/12/16   Harrison Mons, PA  propranolol (INDERAL) 20 MG tablet Take 20 mg by mouth 3 (three) times daily as needed (Palpitations).    [provider]  tirzepatide Darcel Bayley) 5 MG/0.5ML Pen Inject 0.5 mg into the skin. 02/07/22   [provider]  traZODone (DESYREL) 100 MG tablet Take 1 tablet (100 mg total) by mouth at bedtime as needed for sleep. 05/02/18   Jacelyn Pi, Lilia Argue, MD  valACYclovir (VALTREX) 1000 MG tablet Take 1 tablet by mouth as needed. 05/01/19   [provider]      Allergies    Atorvastatin, Statins, Ciprofloxacin, and Morphine and related    Review of Systems   Review of Systems  Constitutional:  Negative for diaphoresis and fever.  Respiratory:  Positive for shortness of breath. Negative for cough.   Cardiovascular:  Positive for chest pain.  Gastrointestinal:  Negative for  vomiting.   Physical Exam Updated Vital Signs BP 107/67   Pulse 67   Temp 97.6 F (36.4 C) (Oral)   Resp (!) 21   Ht 1.499 m (_0 )   Wt 61.2 kg   SpO2 96%   BMI 27.27 kg/m  Physical Exam CONSTITUTIONAL: Well developed/well nourished HEAD: Normocephalic/atraumatic EYES: EOMI/PERRL ENMT: Mucous membranes moist NECK: supple no meningeal signs SPINE/BACK:entire spine nontender CV: S1/S2 noted, no murmurs/rubs/gallops noted LUNGS: Lungs are clear to auscultation bilaterally, no apparent distress ABDOMEN: soft, nontender, no rebound or guarding, bowel sounds noted throughout abdomen GU:no cva tenderness NEURO: Pt is awake/alert/appropriate, moves all extremitiesx4.  No facial droop.   EXTREMITIES: pulses normal/equal, full ROM, no calf tenderness or edema SKIN: warm, color normal PSYCH: Flat affect  ED Results / Procedures / Treatments   Labs (all labs ordered are listed, but only abnormal results are displayed) Labs Reviewed  BASIC METABOLIC PANEL - Abnormal; Notable for the following  components:      Result Value   Calcium 8.1 (*)    All other components within normal limits  CBC  TROPONIN I (HIGH SENSITIVITY)  TROPONIN I (HIGH SENSITIVITY)    EKG EKG Interpretation  Date/Time:  Thursday Mar 22 2022 21:01:07 EDT Ventricular Rate:  67 PR Interval:  154 QRS Duration: 72 QT Interval:  398 QTC Calculation: 420 R Axis:   41 Text Interpretation: Normal sinus rhythm Low voltage QRS No previous ECGs available Confirmed by Ripley Fraise [phone]) on 03/23/2022 12:02:52 AM  Radiology DG Chest 2 View  Result Date: 03/22/2022 CLINICAL DATA:  Left chest pain EXAM: CHEST - 2 VIEW COMPARISON:  08/03/2019 FINDINGS: The heart size and mediastinal contours are within normal limits. Both lungs are clear. The visualized skeletal structures are unremarkable. IMPRESSION: No active cardiopulmonary disease. Electronically Signed   By: Fidela Salisbury M.D.   On: 03/22/2022 21:26     Procedures Procedures    Medications Ordered in ED Medications - No data to display  ED Course/ Medical Decision Making/ A&P Clinical Course as of 03/23/22 0100  Fri Mar 23, 2022  0038 Patient with recent history of palpitations now presenting with chest pain and pressure.  Recently seen by cardiology and has an echocardiogram result pending.  Also supposed to have a coronary CT soon.  She is overall well-appearing at this time.  Work-up pending [DW]  0039 Mild hypocalcemia, but no EKG changes.  This can be managed in outpatient with OTC meds [DW]  0057 Repeat troponin is negative.  Vitals appropriate.  Patient does report recent stress particularly with her job.  She declines any pain medicine at this time.  She will be discharged home and call her cardiologist in the morning [DW]    Clinical Course User Index [DW] Ripley Fraise, MD   HEAR Score: 3                       Medical Decision Making Amount and/or Complexity of Data Reviewed Labs: ordered. Radiology: ordered.   This patient presents to the ED for concern of chest pain, this involves an extensive number of treatment options, and is a complaint that carries with it a high risk of complications and morbidity.  The differential diagnosis includes but is not limited to acute coronary syndrome, aortic dissection, pulmonary embolism, pericarditis, pneumothorax, pneumonia, myocarditis, pleurisy, esophageal rupture    Comorbidities that complicate the patient evaluation: Patient's presentation is complicated by their history of diabetes and hypertension  Social Determinants of Health: Patient's  adopted   increases the complexity of managing their presentation  Additional history obtained: Additional history obtained from spouse Records reviewed  cardiology notes reviewed  Lab Tests: I Ordered, and personally interpreted labs.  The pertinent results include:  mild hypocalcemia  Imaging Studies ordered: I ordered  imaging studies including X-ray chest   I independently visualized and interpreted imaging which showed no acute findings I agree with the radiologist interpretation  Cardiac Monitoring: The patient was maintained on a cardiac monitor.  I personally viewed and interpreted the cardiac monitor which showed an underlying rhythm of:  sinus rhythm  Medicines ordered and prescription drug management:  Patient declines pain meds  Test Considered: Patient is low risk / negative by hear score, therefore do not feel that admission is indicated.  After the interventions noted above, I reevaluated the patient and found that they have :stayed the same  Complexity of problems addressed: Patient's presentation is  most consistent with  acute presentation with potential threat to life or bodily function  Disposition: After consideration of the diagnostic results and the patient's response to treatment,  I feel that the patent would benefit from discharge   .           Final Clinical Impression(s) / ED Diagnoses Final diagnoses:  Precordial pain    Rx / DC Orders ED Discharge Orders     None         Ripley Fraise, MD 03/23/22 0101

## 2022-03-23 NOTE — Telephone Encounter (Addendum)
Spoke to the patient regarding palpitations, she is very symptomatic, propranolol was previously working, will discontinue this, will try metoprolol tartrate 50 mg p.o. twice daily.  Spoke to the patient in person    ICD-10-CM   1. 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 Palpitations  R00.2 metoprolol tartrate (LOPRESSOR) 50 MG tablet     Meds ordered this encounter  Medications   metoprolol tartrate (LOPRESSOR) 50 MG tablet    Sig: Take 1 tablet (50 mg total) by mouth 2 (two) times daily. Take one extra tab if palpitations per day    Dispense:  70 tablet    Refill:  2    Medications Discontinued During This Encounter  Medication Reason   propranolol (INDERAL) 20 MG tablet Ineffective

## 2022-04-05 ENCOUNTER — Encounter: Payer: Self-pay | Admitting: Cardiology

## 2022-04-05 NOTE — Telephone Encounter (Signed)
From patient.

## 2022-04-06 ENCOUNTER — Ambulatory Visit: Payer: BC Managed Care – PPO | Admitting: Cardiology

## 2022-04-06 ENCOUNTER — Encounter: Payer: Self-pay | Admitting: Cardiology

## 2022-04-06 VITALS — BP 107/73 | HR 85 | Temp 98.0°F | Resp 16 | Ht 59.0 in | Wt 132.0 lb

## 2022-04-06 DIAGNOSIS — R002 Palpitations: Secondary | ICD-10-CM

## 2022-04-06 DIAGNOSIS — E119 Type 2 diabetes mellitus without complications: Secondary | ICD-10-CM

## 2022-04-06 DIAGNOSIS — I493 Ventricular premature depolarization: Secondary | ICD-10-CM

## 2022-04-06 MED ORDER — METOPROLOL SUCCINATE ER 100 MG PO TB24: 100.0000 mg | ORAL_TABLET | Freq: Every day | ORAL | 2 refills | Status: DC

## 2022-04-06 MED ORDER — METOPROLOL TARTRATE 50 MG PO TABS: 50.0000 mg | ORAL_TABLET | Freq: Three times a day (TID) | ORAL | 2 refills | Status: DC | PRN
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
## 2022-04-07 ENCOUNTER — Encounter: Payer: Self-pay | Admitting: Cardiology

## 2022-04-07 DIAGNOSIS — R002 Palpitations: Secondary | ICD-10-CM

## 2022-04-07 DIAGNOSIS — I493 Ventricular premature depolarization: Secondary | ICD-10-CM

## 2022-04-09 MED ORDER — ACEBUTOLOL HCL 200 MG PO CAPS: 200.0000 mg | ORAL_CAPSULE | Freq: Two times a day (BID) | ORAL | 2 refills | Status: DC

## 2022-04-09 NOTE — Telephone Encounter (Signed)
From patient.

## 2022-04-25 ENCOUNTER — Ambulatory Visit
Admission: RE | Admit: 2022-04-25 | Discharge: 2022-04-25 | Disposition: A | Discharge status: Home / Self Care | Payer: No Typology Code available for payment source | Source: Ambulatory Visit | Attending: Cardiology | Admitting: Cardiology

## 2022-04-25 DIAGNOSIS — E119 Type 2 diabetes mellitus without complications: Secondary | ICD-10-CM

## 2022-05-07 ENCOUNTER — Other Ambulatory Visit: Payer: Self-pay | Admitting: Cardiology

## 2022-05-07 DIAGNOSIS — I493 Ventricular premature depolarization: Secondary | ICD-10-CM

## 2022-05-07 DIAGNOSIS — R002 Palpitations: Secondary | ICD-10-CM

## 2022-07-11 ENCOUNTER — Ambulatory Visit: Payer: BC Managed Care – PPO | Admitting: Cardiology

## 2022-07-24 ENCOUNTER — Other Ambulatory Visit: Payer: Self-pay

## 2022-07-24 ENCOUNTER — Encounter (HOSPITAL_BASED_OUTPATIENT_CLINIC_OR_DEPARTMENT_OTHER): Payer: Self-pay | Admitting: Emergency Medicine

## 2022-07-24 ENCOUNTER — Emergency Department (HOSPITAL_BASED_OUTPATIENT_CLINIC_OR_DEPARTMENT_OTHER)
Admission: EM | Admit: 2022-07-24 | Discharge: 2022-07-24 | Disposition: A | Discharge status: Home / Self Care | Payer: BC Managed Care – PPO | Attending: Emergency Medicine | Admitting: Emergency Medicine

## 2022-07-24 DIAGNOSIS — E119 Type 2 diabetes mellitus without complications: Secondary | ICD-10-CM | POA: Insufficient documentation

## 2022-07-24 DIAGNOSIS — Z79899 Other long term (current) drug therapy: Secondary | ICD-10-CM | POA: Diagnosis not present

## 2022-07-24 DIAGNOSIS — Z7982 Long term (current) use of aspirin: Secondary | ICD-10-CM | POA: Diagnosis not present

## 2022-07-24 DIAGNOSIS — R55 Syncope and collapse: Secondary | ICD-10-CM | POA: Insufficient documentation

## 2022-07-24 DIAGNOSIS — I10 Essential (primary) hypertension: Secondary | ICD-10-CM | POA: Diagnosis not present

## 2022-07-24 DIAGNOSIS — E871 Hypo-osmolality and hyponatremia: Secondary | ICD-10-CM | POA: Diagnosis not present

## 2022-07-24 DIAGNOSIS — E860 Dehydration: Secondary | ICD-10-CM | POA: Insufficient documentation

## 2022-07-24 DIAGNOSIS — Z794 Long term (current) use of insulin: Secondary | ICD-10-CM | POA: Insufficient documentation

## 2022-07-24 DIAGNOSIS — I959 Hypotension, unspecified: Secondary | ICD-10-CM | POA: Diagnosis present

## 2022-07-24 LAB — CBC
HCT: 40.7 % (ref 36.0–46.0)
Hemoglobin: 14 g/dL (ref 12.0–15.0)
MCH: 31.7 pg (ref 26.0–34.0)
MCHC: 34.4 g/dL (ref 30.0–36.0)
MCV: 92.3 fL (ref 80.0–100.0)
Platelets: 229 10*3/uL (ref 150–400)
RBC: 4.41 MIL/uL (ref 3.87–5.11)
RDW: 11.9 % (ref 11.5–15.5)
WBC: 6.1 10*3/uL (ref 4.0–10.5)
nRBC: 0 % (ref 0.0–0.2)

## 2022-07-24 LAB — URINALYSIS, ROUTINE W REFLEX MICROSCOPIC
Bilirubin Urine: NEGATIVE
Glucose, UA: >=500 mg/dL — AB
Hgb urine dipstick: NEGATIVE
Ketones, ur: NEGATIVE mg/dL
Leukocytes,Ua: NEGATIVE
Nitrite: NEGATIVE
Protein, ur: NEGATIVE mg/dL
Specific Gravity, Urine: 1.015 (ref 1.005–1.030)
pH: 6 (ref 5.0–8.0)

## 2022-07-24 LAB — CBG MONITORING, ED: Glucose-Capillary: 79 mg/dL (ref 70–99)

## 2022-07-24 LAB — COMPREHENSIVE METABOLIC PANEL
ALT: 22 U/L (ref 0–44)
AST: 17 U/L (ref 15–41)
Albumin: 4.4 g/dL (ref 3.5–5.0)
Alkaline Phosphatase: 45 U/L (ref 38–126)
Anion gap: 8 (ref 5–15)
BUN: 31 mg/dL — ABNORMAL HIGH (ref 6–20)
CO2: 25 mmol/L (ref 22–32)
Calcium: 8.9 mg/dL (ref 8.9–10.3)
Chloride: 99 mmol/L (ref 98–111)
Creatinine, Ser: 0.89 mg/dL (ref 0.44–1.00)
GFR, Estimated: >60 mL/min (ref >60)
Glucose, Bld: 91 mg/dL (ref 70–99)
Potassium: 4.9 mmol/L (ref 3.5–5.1)
Sodium: 132 mmol/L — ABNORMAL LOW (ref 135–145)
Total Bilirubin: 0.9 mg/dL (ref 0.3–1.2)
Total Protein: 6.9 g/dL (ref 6.5–8.1)

## 2022-07-24 LAB — URINALYSIS, MICROSCOPIC (REFLEX)

## 2022-07-24 LAB — CK: Total CK: 31 U/L — ABNORMAL LOW (ref 38–234)

## 2022-07-24 MED ORDER — SODIUM CHLORIDE 0.9 % IV BOLUS
1000.0000 mL | Freq: Once | INTRAVENOUS | Status: AC
  Administered 2022-07-24: 1000 mL via INTRAVENOUS

## 2022-07-24 NOTE — Discharge Instructions (Signed)
Your history, exam, evaluation today are consistent with dehydration likely leading to your near syncopal episodes.  Your blood pressure improved after the fluids as did your symptoms.  I suspect that some of the significant weight changes you had recently contributed to some of the lightheadedness and fluid shifts.  There is no evidence of acute infection on your work-up.  Given your improvement in symptoms, we do feel you are safe for discharge home.  Please follow-up with your doctor tomorrow as we discussed and if any symptoms change or worsen acutely, please return to the nearest emergency department.

## 2022-07-24 NOTE — ED Notes (Signed)
Pt a&ox4.  RR even and unlabored on RA with symmetrical rise and fall of chest.  Continuous cardiac and pulse ox monitoring with frequent vital sign checks.  Pt bp improving however remains hypotensive.  Pt denies pain- reports persistent dizziness and is unsteady upon standing - pt assisted on and off bedside commode by this nurse.  Pt now back in bed with spouse at bedside.  No add'l questions concerns needs verbalized at this time.  Will monitor for acute changes and maintain plan of care.

## 2022-07-24 NOTE — ED Provider Notes (Signed)
[facility] EMERGENCY DEPARTMENT Provider Note   CSN: [id number] Arrival date & time: 07/24/22  1657     History  Chief Complaint  Patient presents with   Hypotension    Josseline Reddin is a 56 y.o. female.  The history is provided by the patient, the spouse and medical records. No language interpreter was used.  Near Syncope This is a recurrent problem. The current episode started less than 1 hour ago. The problem has been gradually improving. Pertinent negatives include no chest pain, no abdominal pain, no headaches and no shortness of breath. The symptoms are aggravated by standing. Nothing relieves the symptoms. She has tried nothing for the symptoms. The treatment provided no relief.       Home Medications Prior to Admission medications   Medication Sig Start Date End Date Taking? Authorizing Provider  acebutolol (SECTRAL) 200 MG capsule TAKE 1 CAPSULE(200 MG) BY MOUTH TWICE DAILY 05/07/22   Adrian Prows, MD  ALPRAZolam Duanne Moron) 0.5 MG tablet TAKE 1 TABLET BY MOUTH TWICE DAILY AS NEEDED FOR ANXIETY 12/20/17   Harrison Mons, PA  aspirin EC 81 MG tablet Take 81 mg by mouth daily. Swallow whole.    [provider]  Biotin w/ Vitamins C & E (HAIR/SKIN/NAILS PO) Take 1 tablet by mouth daily.    [provider]  Blood Glucose Monitoring Suppl KIT Check blood sugar daily as needed 08/03/16   Harrison Mons, PA  cetirizine (ZYRTEC) 10 MG tablet Take 10 mg by mouth daily.    [provider]  diclofenac (VOLTAREN) 75 MG EC tablet Take 75 mg by mouth 2 (two) times daily. 12/05/21   [provider]  diltiazem (CARDIZEM CD) 120 MG 24 hr capsule Take 120 mg by mouth daily.    [provider]  esomeprazole (NEXIUM) 40 MG capsule TAKE 1 CAPSULE DAILY 03/04/18   Harrison Mons, PA  estradiol (ESTRACE) 1 MG tablet Take 1 tablet (1 mg total) by mouth daily. 11/11/17   Jeffery, Chelle, PA  FARXIGA 10 MG TABS tablet Take 10 mg by mouth daily.  02/08/22   [provider]  fluticasone (FLONASE) 50 MCG/ACT nasal spray Place 1 spray into both nostrils daily as needed for allergies or rhinitis.    [provider]  gabapentin (NEURONTIN) 100 MG capsule Take 1 capsule (100 mg total) by mouth 3 (three) times daily. May increase up to 300 mg TID. Patient taking differently: Take 100 mg by mouth as needed. May increase up to 300 mg TID. 05/02/18   Jacelyn Pi, Lilia Argue, MD  glucose blood test strip Use as instructed 08/28/16   Harrison Mons, PA  ibuprofen (ADVIL) 200 MG tablet Take by mouth.    [provider]  Lancets [facility] ULTRASOFT) lancets Test blood sugar daily. 08/17/16   Harrison Mons, PA  levothyroxine (SYNTHROID) 175 MCG tablet Take 175 mcg by mouth daily. 03/18/22   [provider]  meloxicam (MOBIC) 15 MG tablet TAKE 1 TABLET DAILY 05/02/18   Jacelyn Pi, Lilia Argue, MD  metoprolol tartrate (LOPRESSOR) 50 MG tablet Take 1 tablet (50 mg total) by mouth 3 (three) times daily as needed. Take 1 tab if needed for palpitation 04/06/22 07/20/22  Adrian Prows, MD  naltrexone (DEPADE) 50 MG tablet TAKE 1 TABLET PO DAILY 05/18/18   [provider]  NON FORMULARY 1-2 Pump. Diclofenac gel, Gabapentin, Flexeril, & Lidocain 11/06/21   [provider]  NONFORMULARY OR COMPOUNDED ITEM Diclofenac gel, Gabapentin, Flexeril, & Lidocain  [provider]  olmesartan (BENICAR) 20 MG tablet Take 1 tablet (20 mg total) by mouth daily. 05/02/18   Daleen Squibb, MD  ondansetron (ZOFRAN-ODT) 8 MG disintegrating tablet Take 1 tablet (8 mg total) by mouth every 8 (eight) hours as needed for nausea. 01/21/18   Harrison Mons, PA  tirzepatide [facility]) 5 MG/0.5ML Pen Inject 0.5 mg into the skin. 02/07/22   [provider]  traZODone (DESYREL) 100 MG tablet Take 1 tablet (100 mg total) by mouth at bedtime as needed for sleep. 05/02/18   Jacelyn Pi, Lilia Argue, MD  valACYclovir (VALTREX) 1000 MG  tablet Take 1 tablet by mouth as needed. 05/01/19   [provider]      Allergies    Atorvastatin, Statins, Ciprofloxacin, and Morphine and related    Review of Systems   Review of Systems  Constitutional:  Negative for chills, fatigue, fever and unexpected weight change (expected wt loss with meds).  HENT:  Negative for congestion.   Respiratory:  Negative for cough, chest tightness, shortness of breath and wheezing.   Cardiovascular:  Positive for near-syncope. Negative for chest pain.  Gastrointestinal:  Negative for abdominal pain, constipation, diarrhea, nausea and vomiting.  Genitourinary:  Positive for decreased urine volume. Negative for dysuria, flank pain and frequency.  Musculoskeletal:  Negative for back pain, neck pain and neck stiffness.  Skin:  Negative for rash and wound.  Neurological:  Positive for light-headedness. Negative for dizziness, syncope, weakness and headaches.  Psychiatric/Behavioral:  Negative for agitation and confusion.   All other systems reviewed and are negative.   Physical Exam Updated Vital Signs BP (!) 88/51   Pulse 74   Temp 97.9 F (36.6 C) (Oral)   Resp 16   Ht 4' 11.5" (1.511 m)   Wt 55.3 kg   SpO2 97%   BMI 24.23 kg/m  Physical Exam Vitals and nursing note reviewed.  Constitutional:      General: She is not in acute distress.    Appearance: She is well-developed. She is not ill-appearing, toxic-appearing or diaphoretic.  HENT:     Head: Normocephalic and atraumatic.     Nose: Nose normal.     Mouth/Throat:     Mouth: Mucous membranes are dry.     Pharynx: No oropharyngeal exudate or posterior oropharyngeal erythema.  Eyes:     Extraocular Movements: Extraocular movements intact.     Conjunctiva/sclera: Conjunctivae normal.     Pupils: Pupils are equal, round, and reactive to light.  Cardiovascular:     Rate and Rhythm: Normal rate and regular rhythm.     Pulses: Normal pulses.     Heart sounds: No murmur  heard. Pulmonary:     Effort: Pulmonary effort is normal. No respiratory distress.     Breath sounds: Normal breath sounds. No wheezing, rhonchi or rales.  Chest:     Chest wall: No tenderness.  Abdominal:     Palpations: Abdomen is soft.     Tenderness: There is no abdominal tenderness. There is no right CVA tenderness, left CVA tenderness, guarding or rebound.  Musculoskeletal:        General: No swelling or tenderness.     Cervical back: Neck supple. No tenderness.     Right lower leg: No edema.     Left lower leg: No edema.  Skin:    General: Skin is warm and dry.     Capillary Refill: Capillary refill takes less than 2 seconds.  Findings: No erythema or rash.  Neurological:     General: No focal deficit present.     Mental Status: She is alert.     Sensory: No sensory deficit.     Motor: No weakness.  Psychiatric:        Mood and Affect: Mood normal.     ED Results / Procedures / Treatments   Labs (all labs ordered are listed, but only abnormal results are displayed) Labs Reviewed  COMPREHENSIVE METABOLIC PANEL - Abnormal; Notable for the following components:      Result Value   Sodium 132 (*)    BUN 31 (*)    All other components within normal limits  CK - Abnormal; Notable for the following components:   Total CK 31 (*)    All other components within normal limits  URINALYSIS, ROUTINE W REFLEX MICROSCOPIC - Abnormal; Notable for the following components:   Glucose, UA >=500 (*)    All other components within normal limits  URINALYSIS, MICROSCOPIC (REFLEX) - Abnormal; Notable for the following components:   Bacteria, UA RARE (*)    All other components within normal limits  URINE CULTURE  CBC  CBG MONITORING, ED    EKG EKG Interpretation  Date/Time:  Tuesday July 24 2022 23:26:12 EDT Ventricular Rate:  69 PR Interval:  174 QRS Duration: 85 QT Interval:  394 QTC Calculation: 423 R Axis:   1 Text Interpretation: Sinus rhythm Low voltage,  precordial leads Probable anteroseptal infarct, old when compared to prior, similar appearance. No STEMI Confirmed by Antony Blackbird [phone]) on 07/24/2022 11:42:35 PM  Radiology No results found.  Procedures Procedures    Medications Ordered in ED Medications  sodium chloride 0.9 % bolus 1,000 mL (0 mLs Intravenous Stopped 07/24/22 1938)  sodium chloride 0.9 % bolus 1,000 mL (0 mLs Intravenous Stopped 07/24/22 2039)    ED Course/ Medical Decision Making/ A&P                           Medical Decision Making Amount and/or Complexity of Data Reviewed Labs: ordered.    Svea Pusch is a 56 y.o. female with a past medical history significant for diabetes, hypertension, GERD, previous rhabdo, SVT, and thyroid disease who presents with near syncope, lightheadedness, and hypotension.  According to patient and family, she has been on [facility] and has lost about 30 pounds this month.  She reports that for the last few days she has been more fatigued and lightheaded and has had some muscle soreness in her upper extremities.  She reports this is how she felt when she had rhabdo.  She says she is trying to eat and drink but may not be drinking as much is normal.  She said that after physical therapy for her chronic back troubles she sat up and stood up today and then got very lightheaded and near syncopal and pressure was found to be 60 systolic.  She was brought in for evaluation.  Patient currently denying any palpitations, chest pain, shortness of breath.  She is denying any leg pain or leg swelling.  She does report her urine has been decreased from normal and she is having some of the muscle soreness.  She denies any nausea, vomiting, constipation, or diarrhea.  Denies any other complaints on arrival.  On exam, lungs clear and chest nontender.  Abdomen nontender.  Back and flank nontender.  Good pulses in extremities.  No focal neurologic deficits.  Mucous membranes are dry and exam but  exam otherwise unremarkable.  EKG shows no STEMI.  Clinically I do suspect patient is dehydrated and some of the significant weight fluctuation with the medication she has been on may be contributing to some of her symptoms.  We agreed to get labs, give her fluids, and check for infection with urinalysis given her decreased urine.  She had no chest pain, palpitations, shortness of breath, cough, or other thoracic symptoms we will hold on chest x-ray at this time.  After fluids, blood pressure began to improve and was over 100.  She was feeling much better she reports.  Still no nausea, vomiting, or other complaints and no other palpitations.  Her labs showed mild hyponatremia but otherwise electrolytes reassuring.  Kidney function normal.  LFTs normal.  CBC reassuring.  CK was not elevated as it has been in the past or when she had rhabdo.  Do not suspect rhabdo at this time.  Given her improving symptoms with fluids I suspect she was dehydrated.  With her improved vital signs and lack of near syncope with standing I do feel she is now safe for discharge home.  She will see her PCP tomorrow and understood return precautions.  Her lack of chest pain, palpitations, or shortness of breath I have low suspicion for thromboembolic etiology symptoms.  She had no other questions or concerns and was discharged in good condition.         Final Clinical Impression(s) / ED Diagnoses Final diagnoses:  Near syncope  Dehydration    Rx / DC Orders ED Discharge Orders     None       Clinical Impression: 1. Near syncope   2. Dehydration     Disposition: Discharge  Condition: Good  I have discussed the results, Dx and Tx plan with the pt(& family if present). He/she/they expressed understanding and agree(s) with the plan. Discharge instructions discussed at great length. Strict return precautions discussed and pt &/or family have verbalized understanding of the instructions. No further questions  at time of discharge.    New Prescriptions   No medications on file    Follow Up: Harrison Mons, [place] [id number] [phone]     [facility] [address] [id number] BR AXEN [place]       Lakendria Nicastro, Gwenyth Allegra, MD 07/24/22 [phone]

## 2022-07-24 NOTE — ED Notes (Signed)
Pt assisted to bedside commode at this time-- remains unsteady on feet upon standing.  Spouse at bedside reports pt color/appearance improved since fluid boluses.  Pt assisted back to bed by this nurse after voiding.  Continuous cardiac and pulse ox maintained.  Will monitor for acute changes and maintain plan of care.

## 2022-07-24 NOTE — ED Triage Notes (Signed)
Pt c/o lightheadedness and dizziness x 1 week. PT took her BP today and reports a systolic BP in the 70V. Denies acute pain, n/v/d, bloody stools. Endorses normal po intake.

## 2022-07-24 NOTE — ED Notes (Signed)
Patient transported to CT 

## 2022-07-25 NOTE — ED Notes (Signed)
Pt agreeable with d/c plan as discussed by provider- this nurse has verbally reinforced d/c instructions and provided pt with written copy - pt acknowledges verbal understanding and denies any add'l questions, concerns, needs- escorted to veh by this nurse; accompanied home by spouse.   Vitals stable; no distress at d/c

## 2022-07-26 ENCOUNTER — Encounter: Payer: Self-pay | Admitting: Cardiology

## 2022-07-26 ENCOUNTER — Ambulatory Visit: Payer: BC Managed Care – PPO | Admitting: Cardiology

## 2022-07-26 VITALS — BP 84/54 | HR 81 | Temp 97.8°F | Resp 18 | Ht 59.5 in | Wt 124.4 lb

## 2022-07-26 DIAGNOSIS — R002 Palpitations: Secondary | ICD-10-CM

## 2022-07-26 DIAGNOSIS — I493 Ventricular premature depolarization: Secondary | ICD-10-CM

## 2022-07-26 DIAGNOSIS — I10 Essential (primary) hypertension: Secondary | ICD-10-CM
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
## 2022-07-27 LAB — URINE CULTURE

## 2022-07-29 ENCOUNTER — Encounter: Payer: Self-pay | Admitting: Cardiology

## 2022-07-30 NOTE — Telephone Encounter (Signed)
From pt

## 2022-08-02 ENCOUNTER — Other Ambulatory Visit: Payer: Self-pay | Admitting: Cardiology

## 2022-08-02 DIAGNOSIS — R002 Palpitations: Secondary | ICD-10-CM

## 2022-08-02 DIAGNOSIS — I493 Ventricular premature depolarization: Secondary | ICD-10-CM

## 2022-09-06 ENCOUNTER — Ambulatory Visit: Payer: BC Managed Care – PPO | Admitting: Cardiology

## 2022-09-06 ENCOUNTER — Ambulatory Visit: Payer: BC Managed Care – PPO

## 2022-09-06 VITALS — BP 98/63 | HR 63 | Ht 59.5 in | Wt 126.4 lb

## 2022-09-06 DIAGNOSIS — R002 Palpitations: Secondary | ICD-10-CM

## 2022-09-06 DIAGNOSIS — I10 Essential (primary) hypertension: Secondary | ICD-10-CM

## 2022-09-06 DIAGNOSIS — E782 Mixed hyperlipidemia: Secondary | ICD-10-CM
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
## 2022-11-05 ENCOUNTER — Other Ambulatory Visit: Payer: Self-pay | Admitting: Cardiology

## 2022-11-05 DIAGNOSIS — R002 Palpitations: Secondary | ICD-10-CM

## 2022-11-05 DIAGNOSIS — I493 Ventricular premature depolarization: Secondary | ICD-10-CM

## 2022-12-22 IMAGING — CT CT CARDIAC CORONARY ARTERY CALCIUM SCORE
2 of 3 series · 7 of 20 positions shown, 9 images · non-contrast
Comparison: None Available.

CLINICAL DATA: 56-year-old Caucasian female with history of high
cholesterol and hypertension. Evaluate for coronary artery disease.

EXAM:
CT CARDIAC CORONARY ARTERY CALCIUM SCORE
TECHNIQUE: Non-contrast imaging through the heart was performed using
prospective ECG gating. Image post processing was performed on an
independent workstation, allowing for quantitative analysis of the
heart and coronary arteries. Note that this exam targets the heart
and the chest was not imaged in its entirety.

[Series 2: calcium scoring 2.00 qr36 bestdiast 70% hrt calciu · axial · 0.37mm/px · z∈[+1499,+1523]mm · 2 of 60 slices shown]
[im 12/60  vessel]
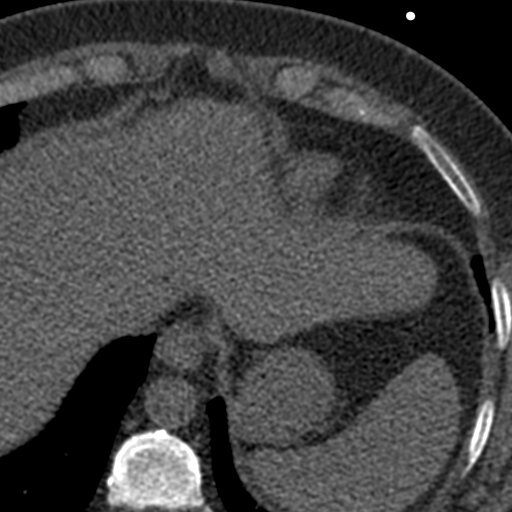
[im 24/60  vessel]
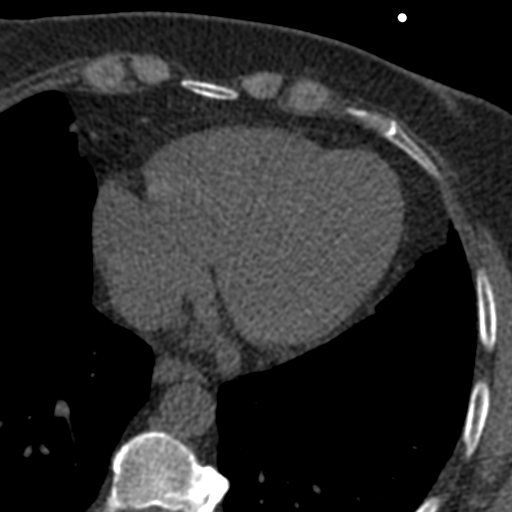

[Series 3: calcium scoring 2.00 br40 bestdiast 70% axial · axial · 0.30mm/px · z∈[+1495,+1575]mm · 5 of 60 slices shown, 7 images]
[im 10/60  vessel]
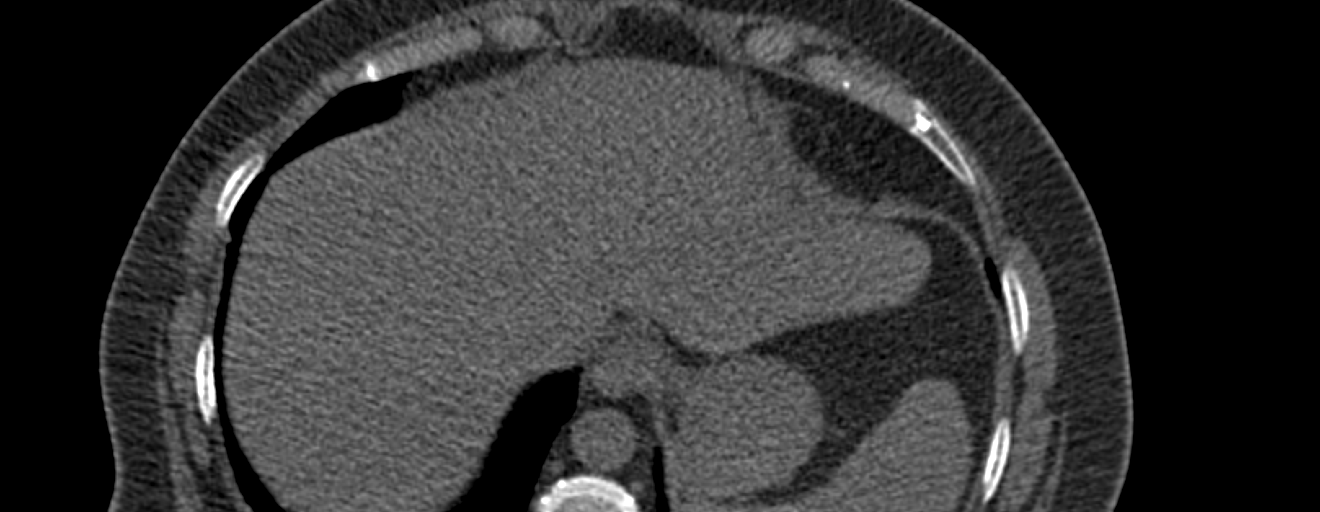
[im 10/60  lung]
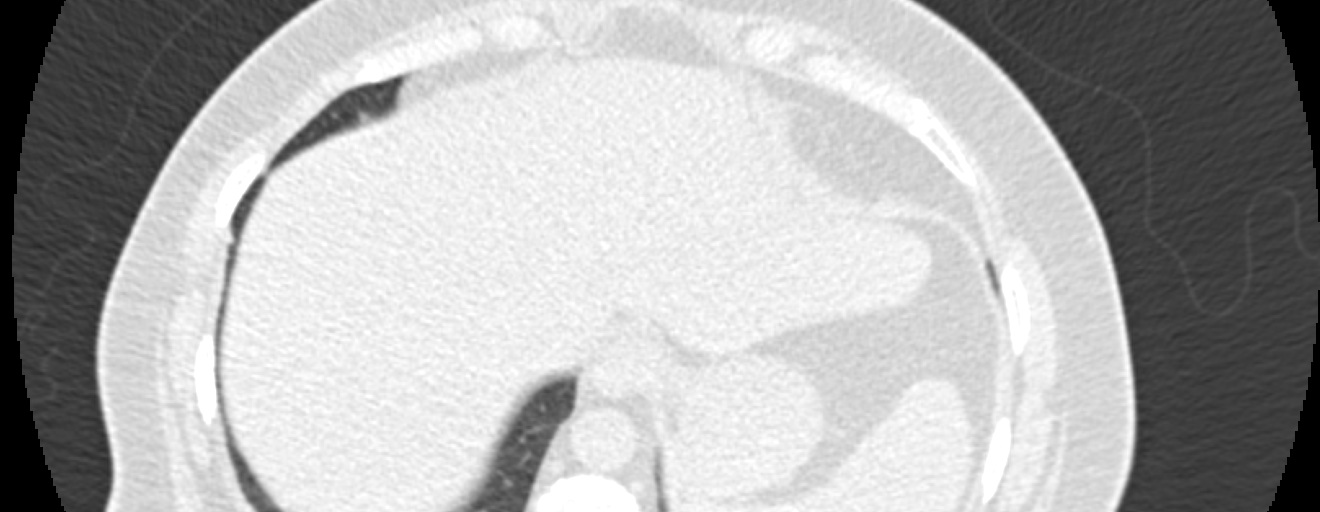
[im 20/60  vessel]
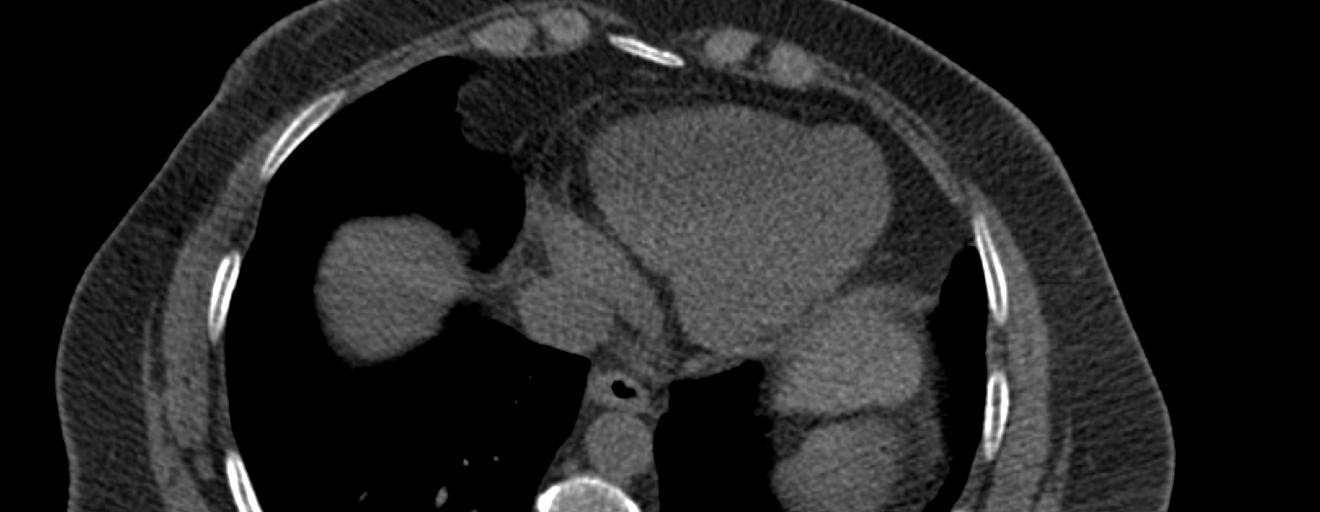
[im 30/60  vessel]
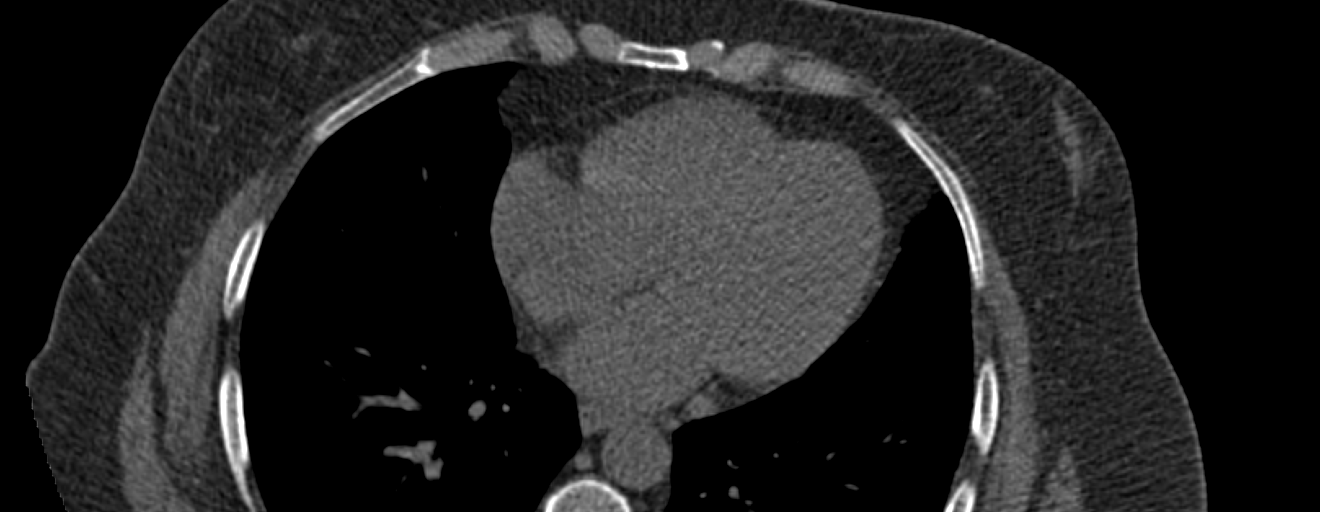
[im 40/60  vessel]
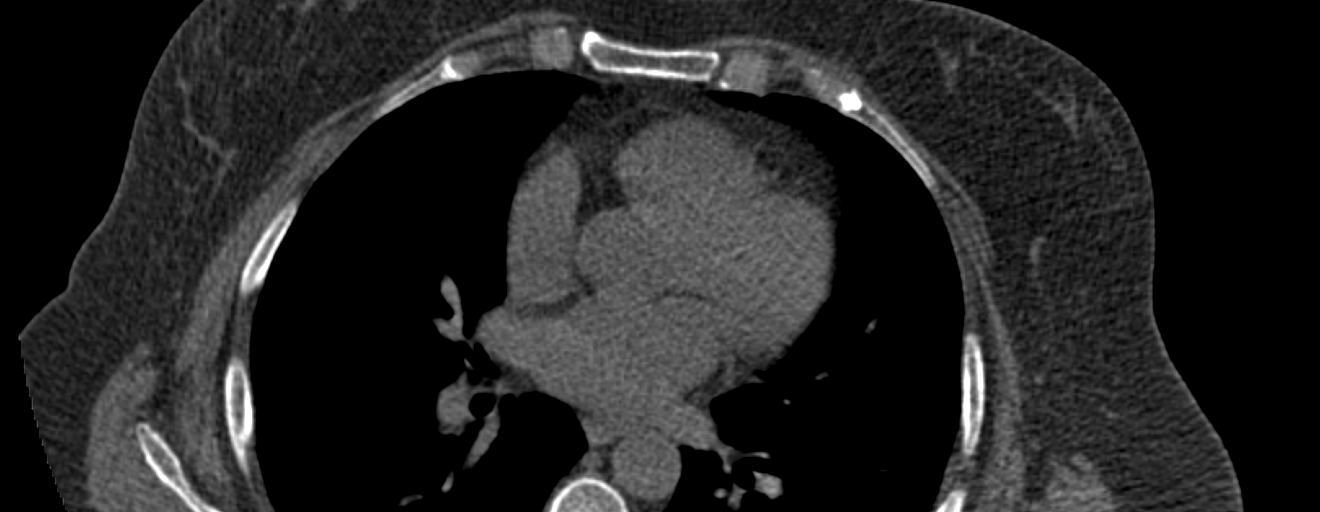
[im 50/60  vessel]
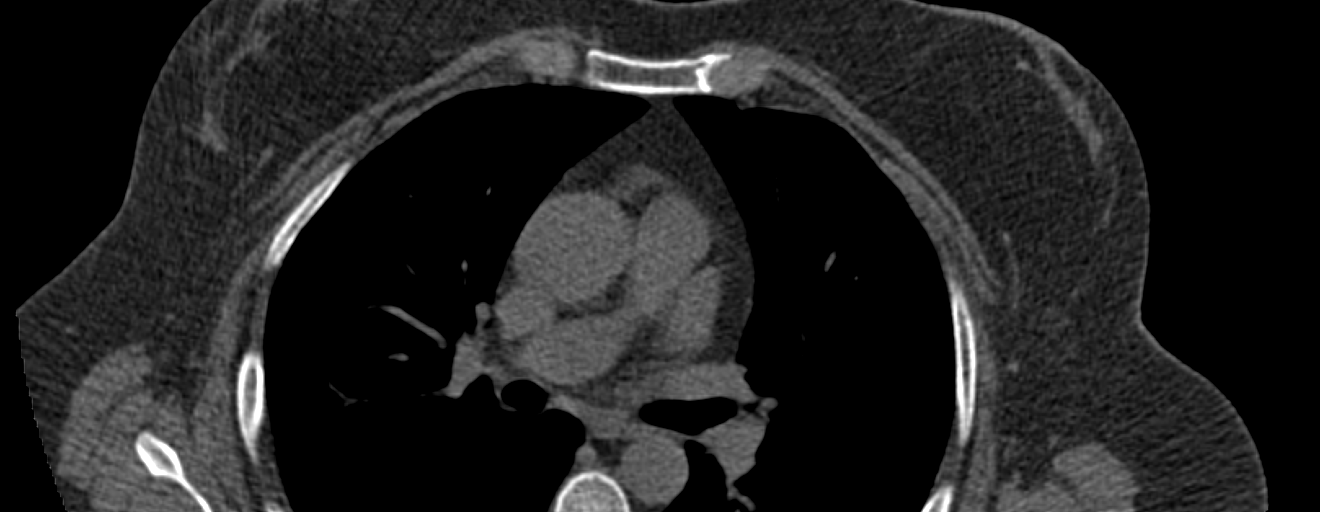
[im 50/60  lung]
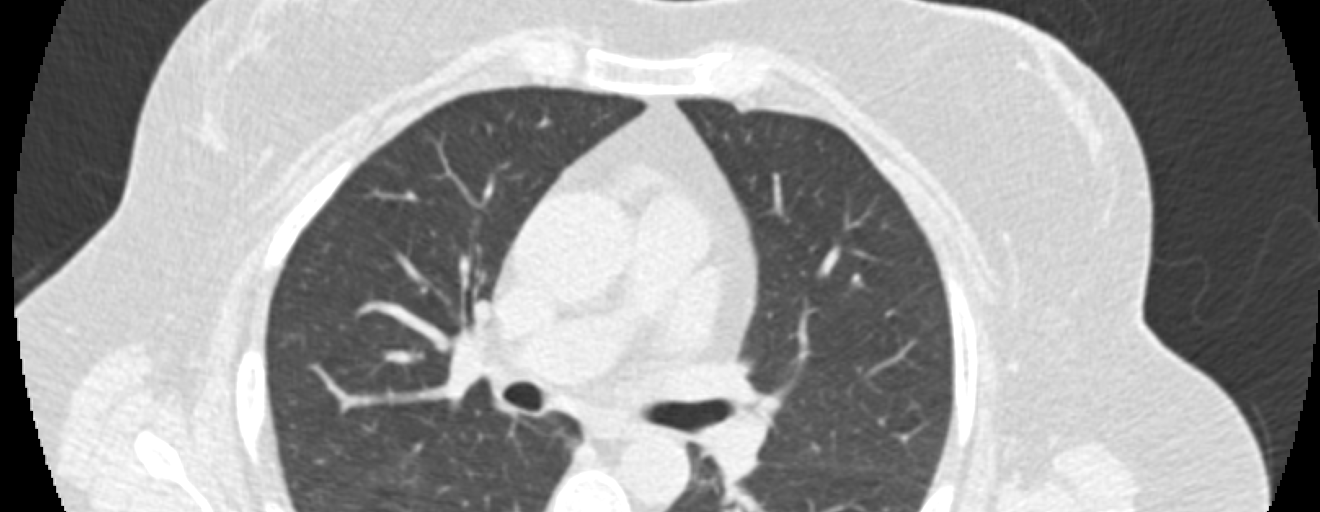

[7 of 20 positions shown; findings below may reference images not displayed]

FINDINGS: CORONARY CALCIUM SCORES:

Left Main: 0

LAD: 0

LCx: 0

RCA: 0

Total Agatston Score: 0

[HOSPITAL] percentile: N/A

AORTA MEASUREMENTS:

Ascending Aorta: 30 mm

Descending Aorta: 21 mm

OTHER FINDINGS:

Within the visualized portions of the thorax there are no suspicious
appearing pulmonary nodules or masses, there is no acute
consolidative airspace disease, no pleural effusions, no
pneumothorax and no lymphadenopathy. Visualized portions of the
upper abdomen are unremarkable. There are no aggressive appearing
lytic or blastic lesions noted in the visualized portions of the
skeleton.
IMPRESSION: 1. Patient's total coronary artery calcium score is 0, which
indicates a very low (but nonzero) risk of major adverse
cardiovascular events over the next 10 years.
2. No significant incidental noncardiac findings are noted.
